# Patient Record
Sex: Female | Born: 1940
Health system: Southern US, Community
[De-identification: ages and names within clinical notes are randomized; demographics above are authoritative.]

## PROBLEM LIST (undated history)

## (undated) DIAGNOSIS — K5790 Diverticulosis of intestine, part unspecified, without perforation or abscess without bleeding: Secondary | ICD-10-CM

## (undated) DIAGNOSIS — I10 Essential (primary) hypertension: Secondary | ICD-10-CM

## (undated) DIAGNOSIS — E7800 Pure hypercholesterolemia, unspecified: Secondary | ICD-10-CM

## (undated) DIAGNOSIS — H409 Unspecified glaucoma: Secondary | ICD-10-CM

## (undated) DIAGNOSIS — K219 Gastro-esophageal reflux disease without esophagitis: Secondary | ICD-10-CM

## (undated) DIAGNOSIS — E041 Nontoxic single thyroid nodule: Secondary | ICD-10-CM

## (undated) DIAGNOSIS — D649 Anemia, unspecified: Secondary | ICD-10-CM

## (undated) HISTORY — PX: TONSILLECTOMY: SUR1361

## (undated) HISTORY — DX: Unspecified glaucoma: H40.9

## (undated) HISTORY — DX: Pure hypercholesterolemia, unspecified: E78.00

## (undated) HISTORY — DX: Nontoxic single thyroid nodule: E04.1

## (undated) HISTORY — DX: Essential (primary) hypertension: I10

## (undated) HISTORY — DX: Anemia, unspecified: D64.9

## (undated) HISTORY — DX: Gastro-esophageal reflux disease without esophagitis: K21.9

## (undated) HISTORY — DX: Diverticulosis of intestine, part unspecified, without perforation or abscess without bleeding: K57.90

---

## 1985-02-21 HISTORY — PX: ABDOMINAL HYSTERECTOMY: SHX81

## 2004-04-19 ENCOUNTER — Ambulatory Visit: Payer: Self-pay | Admitting: Internal Medicine

## 2005-08-10 ENCOUNTER — Ambulatory Visit: Payer: Self-pay | Admitting: Internal Medicine

## 2006-10-06 ENCOUNTER — Ambulatory Visit: Payer: Self-pay | Admitting: Unknown Physician Specialty

## 2006-10-10 ENCOUNTER — Ambulatory Visit: Payer: Self-pay | Admitting: Ophthalmology

## 2006-11-21 ENCOUNTER — Ambulatory Visit: Payer: Self-pay | Admitting: Ophthalmology

## 2006-11-29 ENCOUNTER — Ambulatory Visit: Payer: Self-pay | Admitting: Internal Medicine

## 2006-12-15 ENCOUNTER — Ambulatory Visit: Payer: Self-pay | Admitting: Unknown Physician Specialty

## 2008-03-06 ENCOUNTER — Ambulatory Visit: Payer: Self-pay | Admitting: Internal Medicine

## 2008-06-09 ENCOUNTER — Ambulatory Visit: Payer: Self-pay | Admitting: Internal Medicine

## 2009-08-13 ENCOUNTER — Ambulatory Visit: Payer: Self-pay | Admitting: Internal Medicine

## 2010-08-16 ENCOUNTER — Ambulatory Visit: Payer: Self-pay | Admitting: Internal Medicine

## 2011-01-27 ENCOUNTER — Ambulatory Visit: Payer: Self-pay | Admitting: Internal Medicine

## 2011-08-17 ENCOUNTER — Ambulatory Visit: Payer: Self-pay | Admitting: Internal Medicine

## 2011-09-30 ENCOUNTER — Ambulatory Visit: Payer: Self-pay | Admitting: Internal Medicine

## 2011-10-07 ENCOUNTER — Emergency Department: Payer: Self-pay | Admitting: Internal Medicine

## 2011-11-29 ENCOUNTER — Other Ambulatory Visit: Payer: Self-pay | Admitting: Internal Medicine

## 2011-11-29 NOTE — Telephone Encounter (Signed)
Patient is needing refill on her Losartan 100 mg and Omeprazole 20 mg.

## 2011-11-30 MED ORDER — OMEPRAZOLE 20 MG PO CPDR: 20.0000 mg | DELAYED_RELEASE_CAPSULE | Freq: Every day | ORAL | Status: DC

## 2011-11-30 MED ORDER — LOSARTAN POTASSIUM 100 MG PO TABS: 100.0000 mg | ORAL_TABLET | Freq: Every day | ORAL | Status: DC

## 2012-02-21 ENCOUNTER — Encounter: Payer: Self-pay | Admitting: Internal Medicine

## 2012-02-21 ENCOUNTER — Ambulatory Visit (INDEPENDENT_AMBULATORY_CARE_PROVIDER_SITE_OTHER): Payer: Medicare Other | Admitting: Internal Medicine

## 2012-02-21 VITALS — BP 150/80 | HR 61 | Temp 97.8°F | Ht 62.5 in | Wt 118.5 lb

## 2012-02-21 DIAGNOSIS — E7800 Pure hypercholesterolemia, unspecified: Secondary | ICD-10-CM

## 2012-02-21 DIAGNOSIS — E042 Nontoxic multinodular goiter: Secondary | ICD-10-CM

## 2012-02-21 DIAGNOSIS — I10 Essential (primary) hypertension: Secondary | ICD-10-CM

## 2012-02-21 DIAGNOSIS — Z139 Encounter for screening, unspecified: Secondary | ICD-10-CM

## 2012-02-21 DIAGNOSIS — E1165 Type 2 diabetes mellitus with hyperglycemia: Secondary | ICD-10-CM | POA: Insufficient documentation

## 2012-02-21 MED ORDER — LOSARTAN POTASSIUM 100 MG PO TABS: 100.0000 mg | ORAL_TABLET | Freq: Every day | ORAL | Status: DC

## 2012-02-21 MED ORDER — METOPROLOL SUCCINATE ER 25 MG PO TB24: 25.0000 mg | ORAL_TABLET | Freq: Every day | ORAL | Status: DC

## 2012-02-21 MED ORDER — OMEPRAZOLE 20 MG PO CPDR: 20.0000 mg | DELAYED_RELEASE_CAPSULE | Freq: Every day | ORAL | Status: DC

## 2012-02-22 ENCOUNTER — Encounter: Payer: Self-pay | Admitting: Internal Medicine

## 2012-02-22 NOTE — Assessment & Plan Note (Signed)
Saw Dr Renae Fickle.  Obtain records.

## 2012-02-22 NOTE — Progress Notes (Signed)
  Subjective:    Patient ID: Cathy Vazquez, female    DOB: 02/21/1941, 72 y.o.   MRN: [id number]  HPI 72 year old female with past history of hypertension and hypercholesterolemia who comes in today to follow up on these issues as well as for a complete physical exam.  She states she is doing well.  No cardiac symptoms with increased activity or exertion.  She stopped her omeprazole temporarily (to see if she could come off) and her reflux symptoms worsened.  She is back on the medication now and symptoms controlled.  Has never had an EGD.  Blood pressure has been doing well - averaging 136-140 systolic readings.  No nausea or vomiting.  Bowels stable.    Past Medical History  Diagnosis Date  . Hypertension   . Hypercholesterolemia   . GERD (gastroesophageal reflux disease)   . Glaucoma   . Anemia   . Thyroid nodule   . Diverticulosis     Current Outpatient Prescriptions on File Prior to Visit  Medication Sig Dispense Refill  . losartan (COZAAR) 100 MG tablet Take 1 tablet (100 mg total) by mouth daily.  90 tablet  3  . metoprolol succinate (TOPROL-XL) 25 MG 24 hr tablet Take 1 tablet (25 mg total) by mouth daily.  90 tablet  3  . omeprazole (PRILOSEC) 20 MG capsule Take 1 capsule (20 mg total) by mouth daily.  90 capsule  3    Review of Systems Patient denies any headache, lightheadedness or dizziness.  No sinus or allergy symptoms.  No chest pain, tightness or palpitations.  No increased shortness of breath, cough or congestion.  No nausea or vomiting.  No acid reflux currently - on prilosec.  See above.  No abdominal pain or cramping.  No bowel change, such as diarrhea, constipation, BRBPR or melana.  No urine change.        Objective:   Physical Exam Filed Vitals:   02/21/12 1419  BP: 150/80  Pulse: 61  Temp: 97.8 F (36.6 C)   Blood pressure recheck:  83/72  72 year old female in no acute distress.   HEENT:  Nares- clear.  Oropharynx - without lesions. NECK:  Supple.   Nontender.  No audible bruit.  HEART:  Appears to be regular. LUNGS:  No crackles or wheezing audible.  Respirations even and unlabored.  RADIAL PULSE:  Equal bilaterally.    BREASTS:  No nipple discharge or nipple retraction present.  Could not appreciate any distinct nodules or axillary adenopathy.  ABDOMEN:  Soft, nontender.  Bowel sounds present and normal.  No audible abdominal bruit.  GU:  Normal external genitalia.  Vaginal vault without lesion.  S/p hysterectomy.  Pap not performed. Could not appreciate any adnexal masses or tenderness.   RECTAL:  Heme negative.   EXTREMITIES:  No increased edema present.  DP pulses palpable and equal bilaterally.           Assessment & Plan:  GI.  Colonoscopy 10/06/06 with internal hemorrhoids and diverticulosis.  Barium enema checked out fine.  IFOB.   CARDIOVASCULAR.  Asymptomatic.    HEALTH MAINTENANCE.  Physical today.  Is s/p hysterectomy.  Does not require yearly pap smears.  Colonoscopy as outlined.  IFOB.  States she had her mammogram this summer.  Obtain results.

## 2012-02-22 NOTE — Assessment & Plan Note (Signed)
Low cholesterol diet.  Check lipid panel.    

## 2012-02-22 NOTE — Assessment & Plan Note (Signed)
Blood pressure as outlined.  Have her spot check her pressures and record.  Same medication.  Check metabolic panel.

## 2012-02-29 ENCOUNTER — Other Ambulatory Visit: Payer: Medicare Other

## 2012-03-08 ENCOUNTER — Other Ambulatory Visit (INDEPENDENT_AMBULATORY_CARE_PROVIDER_SITE_OTHER): Payer: Medicare Other

## 2012-03-08 DIAGNOSIS — I10 Essential (primary) hypertension: Secondary | ICD-10-CM

## 2012-03-08 DIAGNOSIS — E7800 Pure hypercholesterolemia, unspecified: Secondary | ICD-10-CM

## 2012-03-08 DIAGNOSIS — E042 Nontoxic multinodular goiter: Secondary | ICD-10-CM

## 2012-03-08 DIAGNOSIS — Z139 Encounter for screening, unspecified: Secondary | ICD-10-CM

## 2012-03-08 LAB — BASIC METABOLIC PANEL
Calcium: 9 mg/dL (ref 8.4–10.5)
Chloride: 106 mEq/L (ref 96–112)
Creatinine, Ser: 0.9 mg/dL (ref 0.4–1.2)

## 2012-03-08 LAB — HEPATIC FUNCTION PANEL
ALT: 27 U/L (ref 0–35)
Bilirubin, Direct: 0.1 mg/dL (ref 0.0–0.3)
Total Bilirubin: 0.7 mg/dL (ref 0.3–1.2)

## 2012-03-08 LAB — CBC WITH DIFFERENTIAL/PLATELET
Basophils Relative: 0.4 % (ref 0.0–3.0)
Eosinophils Relative: 1.7 % (ref 0.0–5.0)
HCT: 39.7 % (ref 36.0–46.0)
MCV: 96.4 fl (ref 78.0–100.0)
Monocytes Absolute: 0.5 10*3/uL (ref 0.1–1.0)
Neutrophils Relative %: 61.6 % (ref 43.0–77.0)
RBC: 4.12 Mil/uL (ref 3.87–5.11)
WBC: 8.4 10*3/uL (ref 4.5–10.5)

## 2012-03-08 LAB — LIPID PANEL
Cholesterol: 229 mg/dL — ABNORMAL HIGH (ref 0–200)
Total CHOL/HDL Ratio: 4
VLDL: 17.6 mg/dL (ref 0.0–40.0)

## 2012-03-12 LAB — FECAL OCCULT BLOOD, IMMUNOCHEMICAL: Fecal Occult Bld: NEGATIVE

## 2012-03-22 ENCOUNTER — Ambulatory Visit: Payer: Medicare Other | Admitting: Internal Medicine

## 2012-03-27 ENCOUNTER — Ambulatory Visit: Payer: Medicare Other | Admitting: Internal Medicine

## 2012-04-03 ENCOUNTER — Encounter: Payer: Self-pay | Admitting: *Deleted

## 2012-04-06 ENCOUNTER — Ambulatory Visit: Payer: Medicare Other | Admitting: Internal Medicine

## 2012-04-10 ENCOUNTER — Telehealth: Payer: Self-pay | Admitting: Internal Medicine

## 2012-04-10 NOTE — Telephone Encounter (Signed)
Left message for patient to call to reschedule her appointment from 2.14.14  cancellation due to weather.

## 2012-04-13 ENCOUNTER — Ambulatory Visit: Payer: Self-pay | Admitting: Internal Medicine

## 2012-04-26 ENCOUNTER — Encounter: Payer: Self-pay | Admitting: Internal Medicine

## 2012-04-26 ENCOUNTER — Ambulatory Visit (INDEPENDENT_AMBULATORY_CARE_PROVIDER_SITE_OTHER): Payer: Medicare Other | Admitting: Internal Medicine

## 2012-04-26 VITALS — BP 138/80 | HR 65 | Temp 97.9°F | Ht 62.5 in | Wt 163.2 lb

## 2012-04-26 DIAGNOSIS — I10 Essential (primary) hypertension: Secondary | ICD-10-CM

## 2012-04-26 DIAGNOSIS — E7800 Pure hypercholesterolemia, unspecified: Secondary | ICD-10-CM

## 2012-04-26 DIAGNOSIS — E042 Nontoxic multinodular goiter: Secondary | ICD-10-CM

## 2012-04-26 NOTE — Progress Notes (Signed)
  Subjective:    Patient ID: Cathy Vazquez, female    DOB: 10/28/1940, 72 y.o.   MRN: [id number]  HPI 72 year old female with past history of hypertension and hypercholesterolemia who comes in today for a scheduled follow up.  She states she is doing well.  No cardiac symptoms with increased activity or exertion.  Breathing stable.  Blood pressure has been doing well - averaging 120-140s/70s.  No nausea or vomiting.  Bowels stable.  Overall she feels good.    Past Medical History  Diagnosis Date  . Hypertension   . Hypercholesterolemia   . GERD (gastroesophageal reflux disease)   . Glaucoma   . Anemia   . Thyroid nodule   . Diverticulosis     Current Outpatient Prescriptions on File Prior to Visit  Medication Sig Dispense Refill  . aspirin 81 MG tablet Take 81 mg by mouth daily.      . dorzolamide-timolol (COSOPT) 22.3-6.8 MG/ML ophthalmic solution 1 drop 2 (two) times daily.      . fish oil-omega-3 fatty acids 1000 MG capsule Take 1 g by mouth 3 (three) times daily.      Marland Kitchen losartan (COZAAR) 100 MG tablet Take 1 tablet (100 mg total) by mouth daily.  90 tablet  3  . metoprolol succinate (TOPROL-XL) 25 MG 24 hr tablet Take 1 tablet (25 mg total) by mouth daily.  90 tablet  3  . Multiple Vitamin (MULTIVITAMIN) tablet Take 1 tablet by mouth daily.      . niacin 500 MG tablet Three capsules bid      . omeprazole (PRILOSEC) 20 MG capsule Take 1 capsule (20 mg total) by mouth daily.  90 capsule  3   No current facility-administered medications on file prior to visit.    Review of Systems  Patient denies any headache, lightheadedness or dizziness.  No sinus or allergy symptoms.  No chest pain, tightness or palpitations.  No increased shortness of breath, cough or congestion.  No nausea or vomiting.  No acid reflux currently - on prilosec.  See above.  No abdominal pain or cramping.  No bowel change, such as diarrhea, constipation, BRBPR or melana.  No urine change.  Has joined curves.   Feels better.  Just saw Dr Renae Fickle.  States everything checked out fine.  Due to see her again in 18 months.        Objective:   Physical Exam  Filed Vitals:   04/26/12 0940  BP: 138/80  Pulse: 65  Temp: 97.9 F (36.6 C)   Blood pressure recheck:  33/59  72 year old female in no acute distress.   HEENT:  Nares- clear.  Oropharynx - without lesions. NECK:  Supple.  Nontender.  No audible bruit.  HEART:  Appears to be regular. LUNGS:  No crackles or wheezing audible.  Respirations even and unlabored.  RADIAL PULSE:  Equal bilaterally.   ABDOMEN:  Soft, nontender.  Bowel sounds present and normal.  No audible abdominal bruit.  EXTREMITIES:  No increased edema present.  DP pulses palpable and equal bilaterally.           Assessment & Plan:  GI.  Colonoscopy 10/06/06 with internal hemorrhoids and diverticulosis.  Barium enema checked out fine.   CARDIOVASCULAR.  Asymptomatic.    HEALTH MAINTENANCE.  Physical last visit.  Is s/p hysterectomy.  Does not require yearly pap smears.  Colonoscopy as outlined.  States she had her mammogram this summer.

## 2012-04-28 ENCOUNTER — Encounter: Payer: Self-pay | Admitting: Internal Medicine

## 2012-04-28 NOTE — Assessment & Plan Note (Signed)
Saw Dr Renae Fickle.  Obtain records.  States everything checked out fine.  Due to follow up in 18 months.

## 2012-04-28 NOTE — Assessment & Plan Note (Signed)
Blood pressure as outlined.  Same medication.  Follow metabolic panel.   

## 2012-04-28 NOTE — Assessment & Plan Note (Signed)
Low cholesterol diet.  follow lipid panel.

## 2012-07-27 ENCOUNTER — Encounter: Payer: Self-pay | Admitting: Internal Medicine

## 2012-07-27 ENCOUNTER — Ambulatory Visit (INDEPENDENT_AMBULATORY_CARE_PROVIDER_SITE_OTHER): Payer: Medicare Other | Admitting: Internal Medicine

## 2012-07-27 VITALS — BP 120/80 | HR 70 | Temp 98.6°F | Ht 62.5 in | Wt 162.8 lb

## 2012-07-27 DIAGNOSIS — E7800 Pure hypercholesterolemia, unspecified: Secondary | ICD-10-CM

## 2012-07-27 DIAGNOSIS — I10 Essential (primary) hypertension: Secondary | ICD-10-CM

## 2012-07-27 DIAGNOSIS — E042 Nontoxic multinodular goiter: Secondary | ICD-10-CM

## 2012-07-29 ENCOUNTER — Encounter: Payer: Self-pay | Admitting: Internal Medicine

## 2012-07-29 NOTE — Assessment & Plan Note (Signed)
Saw Dr Paul.  States everything checked out fine.  Due to follow up in 18 months from last visit.    

## 2012-07-29 NOTE — Progress Notes (Signed)
  Subjective:    Patient ID: Cathy Vazquez, female    DOB: 25-Sep-1940, 72 y.o.   MRN: [id number]  HPI 72 year old female with past history of hypertension and hypercholesterolemia who comes in today for a scheduled follow up.  She states she is doing well.  No cardiac symptoms with increased activity or exertion.  Breathing stable.  Blood pressure has been doing well - per her report.  No nausea or vomiting.  Bowels stable.  Overall she feels good.  Stays active.     Past Medical History  Diagnosis Date  . Hypertension   . Hypercholesterolemia   . GERD (gastroesophageal reflux disease)   . Glaucoma   . Anemia   . Thyroid nodule   . Diverticulosis     Current Outpatient Prescriptions on File Prior to Visit  Medication Sig Dispense Refill  . aspirin 81 MG tablet Take 81 mg by mouth daily.      . dorzolamide-timolol (COSOPT) 22.3-6.8 MG/ML ophthalmic solution 1 drop 2 (two) times daily.      . fish oil-omega-3 fatty acids 1000 MG capsule Take 1 g by mouth 3 (three) times daily.      Marland Kitchen losartan (COZAAR) 100 MG tablet Take 1 tablet (100 mg total) by mouth daily.  90 tablet  3  . metoprolol succinate (TOPROL-XL) 25 MG 24 hr tablet Take 1 tablet (25 mg total) by mouth daily.  90 tablet  3  . Multiple Vitamin (MULTIVITAMIN) tablet Take 1 tablet by mouth daily.      . niacin 500 MG tablet Three capsules bid      . omeprazole (PRILOSEC) 20 MG capsule Take 1 capsule (20 mg total) by mouth daily.  90 capsule  3   No current facility-administered medications on file prior to visit.    Review of Systems Patient denies any headache, lightheadedness or dizziness.  No sinus or allergy symptoms.  No chest pain, tightness or palpitations.  No increased shortness of breath, cough or congestion.  No nausea or vomiting.  No acid reflux currently - on prilosec.  No abdominal pain or cramping.  No bowel change, such as diarrhea, constipation, BRBPR or melana.  No urine change.          Objective:   Physical Exam  Filed Vitals:   07/27/12 0908  BP: 120/80  Pulse: 70  Temp: 98.6 F (37 C)   Blood pressure recheck:  59-88/41  72 year old female in no acute distress.   HEENT:  Nares- clear.  Oropharynx - without lesions. NECK:  Supple.  Nontender.  No audible bruit.  HEART:  Appears to be regular. LUNGS:  No crackles or wheezing audible.  Respirations even and unlabored.  RADIAL PULSE:  Equal bilaterally.   ABDOMEN:  Soft, nontender.  Bowel sounds present and normal.  No audible abdominal bruit.  EXTREMITIES:  No increased edema present.  DP pulses palpable and equal bilaterally.           Assessment & Plan:  GI.  Colonoscopy 10/06/06 with internal hemorrhoids and diverticulosis.  Barium enema checked out fine.   CARDIOVASCULAR.  Asymptomatic.    HEALTH MAINTENANCE.  Physical 02/21/12.  Is s/p hysterectomy.  Does not require yearly pap smears.  Colonoscopy as outlined.  Last mammogram 08/17/11.  Schedule a f/u mammogram.  Normal bone density 10/05/11.

## 2012-07-29 NOTE — Assessment & Plan Note (Signed)
Blood pressure as outlined.  Same medication.  Follow metabolic panel.   

## 2012-07-29 NOTE — Assessment & Plan Note (Signed)
Low cholesterol diet.  follow lipid panel.

## 2012-10-25 ENCOUNTER — Telehealth: Payer: Self-pay | Admitting: *Deleted

## 2012-10-25 ENCOUNTER — Other Ambulatory Visit (INDEPENDENT_AMBULATORY_CARE_PROVIDER_SITE_OTHER): Payer: Medicare Other

## 2012-10-25 DIAGNOSIS — E7800 Pure hypercholesterolemia, unspecified: Secondary | ICD-10-CM

## 2012-10-25 DIAGNOSIS — I10 Essential (primary) hypertension: Secondary | ICD-10-CM

## 2012-10-25 LAB — LIPID PANEL
HDL: 71.9 mg/dL (ref >39.00)
Total CHOL/HDL Ratio: 3
Triglycerides: 137 mg/dL (ref 0.0–149.0)

## 2012-10-25 LAB — COMPREHENSIVE METABOLIC PANEL
ALT: 25 U/L (ref 0–35)
Alkaline Phosphatase: 57 U/L (ref 39–117)
CO2: 27 mEq/L (ref 19–32)
Sodium: 138 mEq/L (ref 135–145)
Total Bilirubin: 0.6 mg/dL (ref 0.3–1.2)
Total Protein: 6.6 g/dL (ref 6.0–8.3)

## 2012-10-25 NOTE — Telephone Encounter (Signed)
The only labs I need are the met c and lipid.  Thanks.

## 2012-10-25 NOTE — Telephone Encounter (Signed)
Just an FYI   I collected the lipid and cmet   i seen that there was a LDL cholesterol, bmet, cbc and tsh; but these have expired, I collected ab extra a lavender top as well, let me know if you needed these as well so I can get these test done

## 2012-10-29 ENCOUNTER — Encounter: Payer: Self-pay | Admitting: Internal Medicine

## 2012-10-29 ENCOUNTER — Ambulatory Visit (INDEPENDENT_AMBULATORY_CARE_PROVIDER_SITE_OTHER): Payer: Medicare Other | Admitting: Internal Medicine

## 2012-10-29 VITALS — BP 120/80 | HR 73 | Temp 97.8°F | Ht 62.5 in | Wt 165.5 lb

## 2012-10-29 DIAGNOSIS — I10 Essential (primary) hypertension: Secondary | ICD-10-CM

## 2012-10-29 DIAGNOSIS — E042 Nontoxic multinodular goiter: Secondary | ICD-10-CM

## 2012-10-29 DIAGNOSIS — E7800 Pure hypercholesterolemia, unspecified: Secondary | ICD-10-CM

## 2012-10-29 NOTE — Progress Notes (Signed)
  Subjective:    Patient ID: Cathy Vazquez, female    DOB: October 06, 1940, 72 y.o.   MRN: [id number]  HPI 72 year old female with past history of hypertension and hypercholesterolemia who comes in today for a scheduled follow up.  She states she is doing well.  No cardiac symptoms with increased activity or exertion.  Breathing stable.  Blood pressure has been doing well - per her report.  States her blood pressure has been averaging 130-135/75.  No nausea or vomiting.  Bowels stable.  Overall she feels good.  Stays active.     Past Medical History  Diagnosis Date  . Hypertension   . Hypercholesterolemia   . GERD (gastroesophageal reflux disease)   . Glaucoma   . Anemia   . Thyroid nodule   . Diverticulosis     Current Outpatient Prescriptions on File Prior to Visit  Medication Sig Dispense Refill  . aspirin 81 MG tablet Take 81 mg by mouth daily.      . dorzolamide-timolol (COSOPT) 22.3-6.8 MG/ML ophthalmic solution 1 drop 2 (two) times daily.      . fish oil-omega-3 fatty acids 1000 MG capsule Take 1 g by mouth 3 (three) times daily.      Marland Kitchen losartan (COZAAR) 100 MG tablet Take 1 tablet (100 mg total) by mouth daily.  90 tablet  3  . metoprolol succinate (TOPROL-XL) 25 MG 24 hr tablet Take 1 tablet (25 mg total) by mouth daily.  90 tablet  3  . Multiple Vitamin (MULTIVITAMIN) tablet Take 1 tablet by mouth daily.      . niacin 500 MG tablet Three capsules bid      . omeprazole (PRILOSEC) 20 MG capsule Take 1 capsule (20 mg total) by mouth daily.  90 capsule  3   No current facility-administered medications on file prior to visit.    Review of Systems Patient denies any headache, lightheadedness or dizziness.  No sinus or allergy symptoms.  No chest pain, tightness or palpitations.  No increased shortness of breath, cough or congestion.  No nausea or vomiting.  No acid reflux currently - on prilosec.  No abdominal pain or cramping.  No bowel change, such as diarrhea, constipation, BRBPR or  melana.  No urine change.          Objective:   Physical Exam  Filed Vitals:   10/29/12 0855  BP: 120/80  Pulse: 73  Temp: 97.8 F (36.6 C)   Blood pressure recheck:  65/13  72 year old female in no acute distress.   HEENT:  Nares- clear.  Oropharynx - without lesions. NECK:  Supple.  Nontender.  No audible bruit.  HEART:  Appears to be regular. LUNGS:  No crackles or wheezing audible.  Respirations even and unlabored.  RADIAL PULSE:  Equal bilaterally.   ABDOMEN:  Soft, nontender.  Bowel sounds present and normal.  No audible abdominal bruit.  EXTREMITIES:  No increased edema present.  DP pulses palpable and equal bilaterally.           Assessment & Plan:  GI.  Colonoscopy 10/06/06 with internal hemorrhoids and diverticulosis.  Barium enema checked out fine.   CARDIOVASCULAR.  Asymptomatic.    HEALTH MAINTENANCE.  Physical 02/21/12.  Is s/p hysterectomy.  Does not require yearly pap smears.  Colonoscopy as outlined.  Last mammogram 08/17/11.  Needs to schedule a f/u mammogram.  Information given to her to schedule her mammogram.  Normal bone density 10/05/11.

## 2012-10-29 NOTE — Assessment & Plan Note (Signed)
Low cholesterol diet.  Follow lipid panel.  LDL on check recently elevated - 163.  Triglycerides 137 and HDL 70.  She has not been taking her niacin regularly.  Will restart.  Follow.

## 2012-10-30 ENCOUNTER — Encounter: Payer: Self-pay | Admitting: Internal Medicine

## 2012-10-30 NOTE — Assessment & Plan Note (Signed)
Saw Dr Paul.  States everything checked out fine.  Due to follow up in 18 months from last visit.    

## 2012-10-30 NOTE — Assessment & Plan Note (Signed)
Blood pressure as outlined.  Same medication.  Follow metabolic panel.   

## 2013-01-22 ENCOUNTER — Ambulatory Visit: Payer: Self-pay | Admitting: Internal Medicine

## 2013-01-22 LAB — HM MAMMOGRAPHY: HM Mammogram: NEGATIVE

## 2013-01-23 ENCOUNTER — Encounter: Payer: Self-pay | Admitting: Internal Medicine

## 2013-02-12 ENCOUNTER — Telehealth: Payer: Self-pay | Admitting: Internal Medicine

## 2013-02-12 MED ORDER — LOSARTAN POTASSIUM 100 MG PO TABS: 100.0000 mg | ORAL_TABLET | Freq: Every day | ORAL | Status: DC

## 2013-02-12 NOTE — Telephone Encounter (Signed)
No Rx refills received from [facility]. Rx sent to pharmacy by [facility], pt notified.

## 2013-02-12 NOTE — Telephone Encounter (Signed)
Cell number [phone] Pt called checking on her rx for lasartin 100mg  tab [facility] stating they have been trying to get in touch with this office.

## 2013-03-06 ENCOUNTER — Encounter: Payer: Medicare Other | Admitting: Internal Medicine

## 2013-03-26 ENCOUNTER — Ambulatory Visit (INDEPENDENT_AMBULATORY_CARE_PROVIDER_SITE_OTHER)
Admission: RE | Admit: 2013-03-26 | Discharge: 2013-03-26 | Disposition: A | Discharge status: Home / Self Care | Payer: Medicare HMO | Source: Ambulatory Visit | Attending: Internal Medicine | Admitting: Internal Medicine

## 2013-03-26 ENCOUNTER — Ambulatory Visit (INDEPENDENT_AMBULATORY_CARE_PROVIDER_SITE_OTHER): Payer: Medicare HMO | Admitting: Internal Medicine

## 2013-03-26 ENCOUNTER — Encounter: Payer: Self-pay | Admitting: Internal Medicine

## 2013-03-26 VITALS — BP 160/110 | HR 79 | Temp 97.7°F | Ht 62.5 in | Wt 175.5 lb

## 2013-03-26 DIAGNOSIS — M549 Dorsalgia, unspecified: Secondary | ICD-10-CM

## 2013-03-26 DIAGNOSIS — M25559 Pain in unspecified hip: Secondary | ICD-10-CM

## 2013-03-26 DIAGNOSIS — M25551 Pain in right hip: Secondary | ICD-10-CM

## 2013-03-26 DIAGNOSIS — M79609 Pain in unspecified limb: Secondary | ICD-10-CM

## 2013-03-26 DIAGNOSIS — I10 Essential (primary) hypertension: Secondary | ICD-10-CM

## 2013-03-26 DIAGNOSIS — M79604 Pain in right leg: Secondary | ICD-10-CM

## 2013-03-26 LAB — COMPREHENSIVE METABOLIC PANEL
ALT: 24 U/L (ref 0–35)
AST: 24 U/L (ref 0–37)
Albumin: 4.1 g/dL (ref 3.5–5.2)
Alkaline Phosphatase: 77 U/L (ref 39–117)
BUN: 14 mg/dL (ref 6–23)
CALCIUM: 9.5 mg/dL (ref 8.4–10.5)
CHLORIDE: 105 meq/L (ref 96–112)
CO2: 24 meq/L (ref 19–32)
CREATININE: 0.9 mg/dL (ref 0.4–1.2)
GFR: 65.79 mL/min (ref >60.00)
GLUCOSE: 92 mg/dL (ref 70–99)
Potassium: 4.1 mEq/L (ref 3.5–5.1)
Sodium: 137 mEq/L (ref 135–145)
Total Bilirubin: 0.6 mg/dL (ref 0.3–1.2)
Total Protein: 7.1 g/dL (ref 6.0–8.3)

## 2013-03-26 MED ORDER — HYDROCHLOROTHIAZIDE 25 MG PO TABS: 25.0000 mg | ORAL_TABLET | Freq: Every day | ORAL | Status: DC

## 2013-03-26 NOTE — Progress Notes (Signed)
Subjective:    Patient ID: Cathy Vazquez, female    DOB: [birth year], 73 y.o.   MRN: [id number]  Leg Pain   73 year old female with past history of hypertension and hypercholesterolemia who comes in today as a work in with concerns regarding increased left knee pain and right inner thigh pain.  She reports that her left knee feels stiff.  Also reports the pain in her right inner thigh started over the last week.  Able to stand better today.  Still with increased pain.  No injury or trauma.  Has been taking tylenol - 1/2 tablet tid.  Blood pressure is elevated.  No cardiac symptoms with increased activity or exertion.  Breathing stable.   No nausea or vomiting.  Bowels stable.  She quit working at [employer].   Past Medical History  Diagnosis Date  . Hypertension   . Hypercholesterolemia   . GERD (gastroesophageal reflux disease)   . Glaucoma   . Anemia   . Thyroid nodule   . Diverticulosis     Current Outpatient Prescriptions on File Prior to Visit  Medication Sig Dispense Refill  . aspirin 81 MG tablet Take 81 mg by mouth daily.      . dorzolamide-timolol (COSOPT) 22.3-6.8 MG/ML ophthalmic solution 1 drop 2 (two) times daily.      . fish oil-omega-3 fatty acids 1000 MG capsule Take 1 g by mouth 3 (three) times daily.      Marland Kitchen losartan (COZAAR) 100 MG tablet Take 1 tablet (100 mg total) by mouth daily.  90 tablet  1  . metoprolol succinate (TOPROL-XL) 25 MG 24 hr tablet Take 1 tablet (25 mg total) by mouth daily.  90 tablet  3  . Multiple Vitamin (MULTIVITAMIN) tablet Take 1 tablet by mouth daily.      . niacin 500 MG tablet Three capsules bid      . omeprazole (PRILOSEC) 20 MG capsule Take 1 capsule (20 mg total) by mouth daily.  90 capsule  3   No current facility-administered medications on file prior to visit.    Review of Systems Patient denies any headache, lightheadedness or dizziness.  No chest pain, tightness or palpitations.  No increased shortness of breath, cough or congestion.   No nausea or vomiting.  No acid reflux currently - on prilosec.  Blood pressure elevated.  Knee pain and right inner thigh pain as outlined.            Objective:   Physical Exam  Filed Vitals:   03/26/13 1331  BP: 160/110  Pulse: 79  Temp: 97.7 F (36.5 C)   Blood pressure recheck:  3/26-9  73 year old female in no acute distress.   HEENT:  Nares- clear.  Oropharynx - without lesions. NECK:  Supple.  Nontender.  No audible bruit.  HEART:  Appears to be regular. LUNGS:  No crackles or wheezing audible.  Respirations even and unlabored.  RADIAL PULSE:  Equal bilaterally.   ABDOMEN:  Soft, nontender.  Bowel sounds present and normal.  No audible abdominal bruit.  EXTREMITIES:  No increased edema present.  DP pulses palpable and equal bilaterally. MSK:  Increased pain right inner thigh - with adduction right lower leg.  Some increased pain right lower buttock/hip with straight leg raise.  Some stiffness with left knee.             Assessment & Plan:  GI.  Colonoscopy 10/06/06 with internal hemorrhoids and diverticulosis.  Barium enema checked out  fine.   CARDIOVASCULAR.  Asymptomatic.    HEALTH MAINTENANCE.  Physical last visit. .  Is s/p hysterectomy.  Does not require yearly pap smears.  Colonoscopy as outlined.  Last mammogram 01/22/13- Birads I.  Normal bone density 10/05/11.

## 2013-03-27 ENCOUNTER — Ambulatory Visit: Payer: Medicare Other | Admitting: Internal Medicine

## 2013-03-27 ENCOUNTER — Telehealth: Payer: Self-pay | Admitting: *Deleted

## 2013-03-27 NOTE — Telephone Encounter (Signed)
Pt notified of lab results from 03/26/13

## 2013-03-29 ENCOUNTER — Telehealth: Payer: Self-pay | Admitting: Internal Medicine

## 2013-03-29 NOTE — Telephone Encounter (Signed)
Phoned in refill.  

## 2013-03-29 NOTE — Telephone Encounter (Signed)
Pt states pharmacy told her they did not receive rx for hydrochlorothiazide.  Advised pt we received receipt.  States she will contact pharmacy again.  Asking if we can resend.

## 2013-03-31 ENCOUNTER — Encounter: Payer: Self-pay | Admitting: Internal Medicine

## 2013-03-31 DIAGNOSIS — M79604 Pain in right leg: Secondary | ICD-10-CM | POA: Insufficient documentation

## 2013-03-31 NOTE — Assessment & Plan Note (Signed)
Blood pressure elevated.  Has had intolerance to multiple medications.  On Losartan and metoprolol.  Start HCTZ q day.  Follow pressures.  Get her back in soon to reassess.  Check metabolic panel.

## 2013-03-31 NOTE — Assessment & Plan Note (Signed)
Right inner thigh pain and right hip and lower buttock/back pain.  Exam as outlined.  Increase tylenol as directed.  Xray.  Further w/uup pending results.

## 2013-04-03 ENCOUNTER — Encounter: Payer: Self-pay | Admitting: Internal Medicine

## 2013-04-03 ENCOUNTER — Ambulatory Visit (INDEPENDENT_AMBULATORY_CARE_PROVIDER_SITE_OTHER): Payer: Medicare HMO | Admitting: Internal Medicine

## 2013-04-03 VITALS — BP 150/90 | HR 76 | Temp 98.0°F | Ht 62.5 in | Wt 173.2 lb

## 2013-04-03 DIAGNOSIS — I10 Essential (primary) hypertension: Secondary | ICD-10-CM

## 2013-04-03 DIAGNOSIS — M79604 Pain in right leg: Secondary | ICD-10-CM

## 2013-04-03 DIAGNOSIS — E7800 Pure hypercholesterolemia, unspecified: Secondary | ICD-10-CM

## 2013-04-03 DIAGNOSIS — R5381 Other malaise: Secondary | ICD-10-CM

## 2013-04-03 DIAGNOSIS — R5383 Other fatigue: Secondary | ICD-10-CM

## 2013-04-03 DIAGNOSIS — M79609 Pain in unspecified limb: Secondary | ICD-10-CM

## 2013-04-03 NOTE — Progress Notes (Signed)
Pre-visit discussion using our clinic review tool. No additional management support is needed unless otherwise documented below in the visit note.  

## 2013-04-07 ENCOUNTER — Encounter: Payer: Self-pay | Admitting: Internal Medicine

## 2013-04-07 NOTE — Assessment & Plan Note (Signed)
Low cholesterol diet.  Follow lipid panel.  LDL on check recently elevated - 163.  Triglycerides 137 and HDL 70.  She had not been taking her niacin regularly.  Restarted.   Follow.

## 2013-04-07 NOTE — Assessment & Plan Note (Signed)
Has had intolerance to multiple medications.  On Losartan and HCTZ q day.  Off metoprolol.  Blood pressure better.  Follow metabolic panel.

## 2013-04-07 NOTE — Assessment & Plan Note (Signed)
Improved but persistent.  Arthritis and degenerative joint changes on xray.  Better.  Given persistence, will refer her to physical therapy for further evaluation and treatment.

## 2013-04-07 NOTE — Progress Notes (Signed)
Subjective:    Patient ID: Cathy Vazquez, female    DOB: [birth year], 73 y.o.   MRN: [id number]  HPI 73 year old female with past history of hypertension and hypercholesterolemia who comes in today for a scheduled follow up.  She states she is doing well.  No cardiac symptoms with increased activity or exertion.  Breathing stable.  Blood pressure has been doing better.  Tolerating her blood pressure medication.  Only on losartan and hctz now.  No nausea or vomiting.  Bowels stable.  Overall she feels good.  Stays active.  Her previous leg, hip and lower back pain has improved.  Much better now.     Past Medical History  Diagnosis Date  . Hypertension   . Hypercholesterolemia   . GERD (gastroesophageal reflux disease)   . Glaucoma   . Anemia   . Thyroid nodule   . Diverticulosis     Current Outpatient Prescriptions on File Prior to Visit  Medication Sig Dispense Refill  . aspirin 81 MG tablet Take 81 mg by mouth daily.      . dorzolamide-timolol (COSOPT) 22.3-6.8 MG/ML ophthalmic solution 1 drop 2 (two) times daily.      . fish oil-omega-3 fatty acids 1000 MG capsule Take 1 g by mouth 3 (three) times daily.      . hydrochlorothiazide (HYDRODIURIL) 25 MG tablet Take 1 tablet (25 mg total) by mouth daily.  30 tablet  2  . losartan (COZAAR) 100 MG tablet Take 1 tablet (100 mg total) by mouth daily.  90 tablet  1  . metoprolol succinate (TOPROL-XL) 25 MG 24 hr tablet Take 1 tablet (25 mg total) by mouth daily.  90 tablet  3  . Multiple Vitamin (MULTIVITAMIN) tablet Take 1 tablet by mouth daily.      . niacin 500 MG tablet Three capsules bid      . omeprazole (PRILOSEC) 20 MG capsule Take 1 capsule (20 mg total) by mouth daily.  90 capsule  3   No current facility-administered medications on file prior to visit.    Review of Systems Patient denies any headache, lightheadedness or dizziness.  No sinus or allergy symptoms.  No chest pain, tightness or palpitations.  No increased shortness  of breath, cough or congestion.  No nausea or vomiting.  No acid reflux currently - on prilosec.  No abdominal pain or cramping.  No bowel change, such as diarrhea, constipation, BRBPR or melana.  No urine change.  Right leg, hip and lower back pain better.  Still some discomfort, but overall better.         Objective:   Physical Exam  Filed Vitals:   04/03/13 1010  BP: 150/90  Pulse: 76  Temp: 98 F (36.7 C)   Blood pressure recheck:  10/25  73 year old female in no acute distress.   HEENT:  Nares- clear.  Oropharynx - without lesions. NECK:  Supple.  Nontender.  No audible bruit.  HEART:  Appears to be regular. LUNGS:  No crackles or wheezing audible.  Respirations even and unlabored.  RADIAL PULSE:  Equal bilaterally.   ABDOMEN:  Soft, nontender.  Bowel sounds present and normal.  No audible abdominal bruit.  EXTREMITIES:  No increased edema present.  DP pulses palpable and equal bilaterally.           Assessment & Plan:  GI.  Colonoscopy 10/06/06 with internal hemorrhoids and diverticulosis.  Barium enema checked out fine.   CARDIOVASCULAR.  Asymptomatic.  HEALTH MAINTENANCE.  Physical 02/21/12.  Is s/p hysterectomy.  Does not require yearly pap smears.  Colonoscopy as outlined.  Last mammogram 01/22/13 - Birads I.   Normal bone density 10/05/11.

## 2013-04-11 ENCOUNTER — Other Ambulatory Visit: Payer: Self-pay | Admitting: *Deleted

## 2013-04-11 ENCOUNTER — Telehealth: Payer: Self-pay | Admitting: Internal Medicine

## 2013-04-11 MED ORDER — LOSARTAN POTASSIUM 100 MG PO TABS: 100.0000 mg | ORAL_TABLET | Freq: Every day | ORAL | Status: DC

## 2013-04-11 NOTE — Telephone Encounter (Signed)
Please send to [place] . The patient is almost out of her medication.   losartan (COZAAR) 100 MG tablet

## 2013-04-17 ENCOUNTER — Other Ambulatory Visit: Payer: Self-pay | Admitting: Internal Medicine

## 2013-04-25 ENCOUNTER — Encounter: Payer: Self-pay | Admitting: Emergency Medicine

## 2013-05-03 ENCOUNTER — Other Ambulatory Visit (INDEPENDENT_AMBULATORY_CARE_PROVIDER_SITE_OTHER): Payer: Medicare HMO

## 2013-05-03 DIAGNOSIS — I10 Essential (primary) hypertension: Secondary | ICD-10-CM

## 2013-05-03 DIAGNOSIS — E7800 Pure hypercholesterolemia, unspecified: Secondary | ICD-10-CM

## 2013-05-03 DIAGNOSIS — R5381 Other malaise: Secondary | ICD-10-CM

## 2013-05-03 DIAGNOSIS — R5383 Other fatigue: Secondary | ICD-10-CM

## 2013-05-03 LAB — LIPID PANEL
CHOL/HDL RATIO: 5
Cholesterol: 242 mg/dL — ABNORMAL HIGH (ref 0–200)
HDL: 45.1 mg/dL (ref >39.00)
LDL Cholesterol: 171 mg/dL — ABNORMAL HIGH (ref 0–99)
TRIGLYCERIDES: 131 mg/dL (ref 0.0–149.0)
VLDL: 26.2 mg/dL (ref 0.0–40.0)

## 2013-05-03 LAB — CBC WITH DIFFERENTIAL/PLATELET
BASOS PCT: 0.4 % (ref 0.0–3.0)
Basophils Absolute: 0 10*3/uL (ref 0.0–0.1)
Eosinophils Absolute: 0.2 10*3/uL (ref 0.0–0.7)
Eosinophils Relative: 1.9 % (ref 0.0–5.0)
HEMATOCRIT: 38.1 % (ref 36.0–46.0)
Hemoglobin: 13.2 g/dL (ref 12.0–15.0)
LYMPHS ABS: 2.7 10*3/uL (ref 0.7–4.0)
LYMPHS PCT: 30.1 % (ref 12.0–46.0)
MCHC: 34.7 g/dL (ref 30.0–36.0)
MCV: 93.8 fl (ref 78.0–100.0)
Monocytes Absolute: 0.5 10*3/uL (ref 0.1–1.0)
Monocytes Relative: 5.3 % (ref 3.0–12.0)
NEUTROS ABS: 5.6 10*3/uL (ref 1.4–7.7)
Neutrophils Relative %: 62.3 % (ref 43.0–77.0)
Platelets: 218 10*3/uL (ref 150.0–400.0)
RBC: 4.06 Mil/uL (ref 3.87–5.11)
RDW: 13.7 % (ref 11.5–14.6)
WBC: 9.1 10*3/uL (ref 4.5–10.5)

## 2013-05-03 LAB — COMPREHENSIVE METABOLIC PANEL
ALT: 25 U/L (ref 0–35)
AST: 21 U/L (ref 0–37)
Albumin: 4.1 g/dL (ref 3.5–5.2)
Alkaline Phosphatase: 69 U/L (ref 39–117)
BUN: 14 mg/dL (ref 6–23)
CALCIUM: 8.8 mg/dL (ref 8.4–10.5)
CHLORIDE: 103 meq/L (ref 96–112)
CO2: 26 mEq/L (ref 19–32)
Creatinine, Ser: 0.9 mg/dL (ref 0.4–1.2)
GFR: 69.35 mL/min (ref >60.00)
GLUCOSE: 102 mg/dL — AB (ref 70–99)
Potassium: 4 mEq/L (ref 3.5–5.1)
Sodium: 137 mEq/L (ref 135–145)
Total Bilirubin: 0.7 mg/dL (ref 0.3–1.2)
Total Protein: 7 g/dL (ref 6.0–8.3)

## 2013-05-03 LAB — TSH: TSH: 1.42 u[IU]/mL (ref 0.35–5.50)

## 2013-05-09 ENCOUNTER — Encounter: Payer: Medicare Other | Admitting: Internal Medicine

## 2013-05-09 ENCOUNTER — Telehealth: Payer: Self-pay | Admitting: Internal Medicine

## 2013-05-09 NOTE — Telephone Encounter (Signed)
Pt.notified

## 2013-05-09 NOTE — Telephone Encounter (Signed)
Pt was originally scheduled for her cpt on 05/09/13.  We were holding labs for her appt.  She rescheduled her cpt to 07/30/13.  Please notify her that her potassium, kidney function and liver function are wnl.  Her total and LDL cholesterol elevated.  She is intolerant to statins.  (has tried multiple medications).  Have her work on a low cholesterol diet and exercise.  We will follow.

## 2013-07-30 ENCOUNTER — Encounter: Payer: Self-pay | Admitting: Internal Medicine

## 2013-07-30 ENCOUNTER — Ambulatory Visit (INDEPENDENT_AMBULATORY_CARE_PROVIDER_SITE_OTHER): Payer: Medicare HMO | Admitting: Internal Medicine

## 2013-07-30 VITALS — BP 170/100 | HR 88 | Temp 98.4°F | Ht 62.75 in | Wt 175.0 lb

## 2013-07-30 DIAGNOSIS — E7800 Pure hypercholesterolemia, unspecified: Secondary | ICD-10-CM

## 2013-07-30 DIAGNOSIS — E042 Nontoxic multinodular goiter: Secondary | ICD-10-CM

## 2013-07-30 DIAGNOSIS — M25569 Pain in unspecified knee: Secondary | ICD-10-CM

## 2013-07-30 DIAGNOSIS — I10 Essential (primary) hypertension: Secondary | ICD-10-CM

## 2013-07-30 MED ORDER — HYDROCHLOROTHIAZIDE 25 MG PO TABS: 25.0000 mg | ORAL_TABLET | Freq: Every day | ORAL | Status: DC

## 2013-07-30 MED ORDER — LOSARTAN POTASSIUM 100 MG PO TABS: ORAL_TABLET | ORAL | Status: DC

## 2013-07-30 NOTE — Progress Notes (Signed)
Pre visit review using our clinic review tool, if applicable. No additional management support is needed unless otherwise documented below in the visit note. 

## 2013-08-04 ENCOUNTER — Encounter: Payer: Self-pay | Admitting: Internal Medicine

## 2013-08-04 DIAGNOSIS — M25569 Pain in unspecified knee: Secondary | ICD-10-CM | POA: Insufficient documentation

## 2013-08-04 NOTE — Assessment & Plan Note (Signed)
Has had intolerance to multiple medications.  On Losartan only.  Off metoprolol and hctz.   Add back hctz.  Follow pressures.  Get her back in soon to reassess.  Follow metabolic panel.

## 2013-08-04 NOTE — Assessment & Plan Note (Signed)
Saw Dr Eddie Dibbles.  States everything checked out fine.  Due to follow up in 18 months from last visit.

## 2013-08-04 NOTE — Assessment & Plan Note (Signed)
Low cholesterol diet.  Follow lipid panel.  She had not been taking her niacin.  Restart.   Follow.

## 2013-08-04 NOTE — Progress Notes (Signed)
Subjective:    Patient ID: Cathy Vazquez, female    DOB: 1940-07-26, 73 y.o.   MRN: [id number]  HPI 73 year old female with past history of hypertension and hypercholesterolemia who comes in today to follow up on these issues as well as for a complete physical exam.   She states she is doing well.  No cardiac symptoms with increased activity or exertion.  Breathing stable.  Blood pressure on her checks have been averaging 140-145/70s.  She has stopped HCTZ and metoprolol.  States she did not tolerate these medications.   No nausea or vomiting.  Bowels stable.  Overall she feels good.  Stays active.  She is having some issues with her left knee.  She is going to exercise.  Desires no further intervention at this time.      Past Medical History  Diagnosis Date  . Hypertension   . Hypercholesterolemia   . GERD (gastroesophageal reflux disease)   . Glaucoma   . Anemia   . Thyroid nodule   . Diverticulosis     Current Outpatient Prescriptions on File Prior to Visit  Medication Sig Dispense Refill  . aspirin 81 MG tablet Take 81 mg by mouth daily.      . dorzolamide-timolol (COSOPT) 22.3-6.8 MG/ML ophthalmic solution 1 drop 2 (two) times daily.      . fish oil-omega-3 fatty acids 1000 MG capsule Take 1 g by mouth 3 (three) times daily.      . Multiple Vitamin (MULTIVITAMIN) tablet Take 1 tablet by mouth daily.      . niacin 500 MG tablet Three capsules bid      . omeprazole (PRILOSEC) 20 MG capsule Take 1 capsule (20 mg total) by mouth daily.  90 capsule  3   No current facility-administered medications on file prior to visit.    Review of Systems Patient denies any headache, lightheadedness or dizziness.  No sinus or allergy symptoms.  No chest pain, tightness or palpitations.  No increased shortness of breath, cough or congestion.  No nausea or vomiting.  No acid reflux currently - on prilosec.  No abdominal pain or cramping.  No bowel change, such as diarrhea, constipation, BRBPR or  melana.  No urine change.  Knee pain/stiffness as outlined.  Desires no further intervention.  Going to exercise class.         Objective:   Physical Exam  Filed Vitals:   07/30/13 1531  BP: 170/100  Pulse: 88  Temp: 98.4 F (36.9 C)   Blood pressure recheck:  170/82, pulse 32  73 year old female in no acute distress.   HEENT:  Nares- clear.  Oropharynx - without lesions. NECK:  Supple.  Nontender.  No audible bruit.  HEART:  Appears to be regular. LUNGS:  No crackles or wheezing audible.  Respirations even and unlabored.  RADIAL PULSE:  Equal bilaterally.    BREASTS:  No nipple discharge.  Left nipple inversion present - unchanged.   Could not appreciate any distinct nodules or axillary adenopathy.  ABDOMEN:  Soft, nontender.  Bowel sounds present and normal.  No audible abdominal bruit.  GU:  Not performed.     EXTREMITIES:  No increased edema present.  DP pulses palpable and equal bilaterally.      FEET:  No lesions.        Assessment & Plan:  GI.  Colonoscopy 10/06/06 with internal hemorrhoids and diverticulosis.  Barium enema checked out fine.   CARDIOVASCULAR.  Asymptomatic.  HEALTH MAINTENANCE.  Physical today.  Is s/p hysterectomy.  Does not require yearly pap smears.  Colonoscopy as outlined.  Last mammogram 01/22/13 - Birads I.   Normal bone density 10/05/11.

## 2013-08-04 NOTE — Assessment & Plan Note (Signed)
Desires no further intervention at this time.  Follow.  Continue exercise.

## 2013-08-30 ENCOUNTER — Other Ambulatory Visit (INDEPENDENT_AMBULATORY_CARE_PROVIDER_SITE_OTHER): Payer: Medicare HMO

## 2013-08-30 ENCOUNTER — Encounter (INDEPENDENT_AMBULATORY_CARE_PROVIDER_SITE_OTHER): Payer: Self-pay

## 2013-08-30 ENCOUNTER — Ambulatory Visit: Payer: Medicare HMO | Admitting: Internal Medicine

## 2013-08-30 DIAGNOSIS — E7800 Pure hypercholesterolemia, unspecified: Secondary | ICD-10-CM

## 2013-08-30 DIAGNOSIS — I10 Essential (primary) hypertension: Secondary | ICD-10-CM

## 2013-08-30 LAB — BASIC METABOLIC PANEL
BUN: 13 mg/dL (ref 6–23)
CO2: 26 meq/L (ref 19–32)
Calcium: 9.1 mg/dL (ref 8.4–10.5)
Chloride: 108 mEq/L (ref 96–112)
Creatinine, Ser: 0.9 mg/dL (ref 0.4–1.2)
GFR: 68.36 mL/min (ref >60.00)
GLUCOSE: 117 mg/dL — AB (ref 70–99)
POTASSIUM: 4.2 meq/L (ref 3.5–5.1)
Sodium: 141 mEq/L (ref 135–145)

## 2013-08-30 LAB — LIPID PANEL
Cholesterol: 246 mg/dL — ABNORMAL HIGH (ref 0–200)
HDL: 48.2 mg/dL (ref >39.00)
LDL CALC: 174 mg/dL — AB (ref 0–99)
NONHDL: 197.8
Total CHOL/HDL Ratio: 5
Triglycerides: 117 mg/dL (ref 0.0–149.0)
VLDL: 23.4 mg/dL (ref 0.0–40.0)

## 2013-08-30 LAB — HEPATIC FUNCTION PANEL
ALT: 23 U/L (ref 0–35)
AST: 21 U/L (ref 0–37)
Albumin: 4.1 g/dL (ref 3.5–5.2)
Alkaline Phosphatase: 67 U/L (ref 39–117)
Bilirubin, Direct: 0 mg/dL (ref 0.0–0.3)
Total Bilirubin: 0.4 mg/dL (ref 0.2–1.2)
Total Protein: 7.4 g/dL (ref 6.0–8.3)

## 2013-09-03 ENCOUNTER — Ambulatory Visit (INDEPENDENT_AMBULATORY_CARE_PROVIDER_SITE_OTHER): Payer: Medicare HMO | Admitting: Internal Medicine

## 2013-09-03 ENCOUNTER — Encounter: Payer: Self-pay | Admitting: Internal Medicine

## 2013-09-03 VITALS — BP 150/80 | HR 76 | Temp 98.2°F | Ht 62.5 in | Wt 175.5 lb

## 2013-09-03 DIAGNOSIS — R739 Hyperglycemia, unspecified: Secondary | ICD-10-CM

## 2013-09-03 DIAGNOSIS — R7309 Other abnormal glucose: Secondary | ICD-10-CM

## 2013-09-03 DIAGNOSIS — I10 Essential (primary) hypertension: Secondary | ICD-10-CM

## 2013-09-03 DIAGNOSIS — E7800 Pure hypercholesterolemia, unspecified: Secondary | ICD-10-CM

## 2013-09-03 NOTE — Progress Notes (Signed)
Pre visit review using our clinic review tool, if applicable. No additional management support is needed unless otherwise documented below in the visit note. 

## 2013-09-07 ENCOUNTER — Encounter: Payer: Self-pay | Admitting: Internal Medicine

## 2013-09-07 NOTE — Assessment & Plan Note (Signed)
Has had intolerance to multiple medications.  On Losartan only.  Off metoprolol and hctz.   Follow pressures.   Follow metabolic panel.  Feels better only on losartan.

## 2013-09-07 NOTE — Assessment & Plan Note (Signed)
Low cholesterol diet.  Follow lipid panel.  Back on niacin.  Titrate up the dose.  Follow.

## 2013-09-07 NOTE — Progress Notes (Signed)
Subjective:    Patient ID: Cathy Vazquez, female    DOB: 1941/01/24, 73 y.o.   MRN: [id number]  HPI 73 year old female with past history of hypertension and hypercholesterolemia who comes in today for a scheduled follow up.   She states she is doing well.  No cardiac symptoms with increased activity or exertion.  Breathing stable.  Blood pressure on her checks have been averaging 140s/70s.  She has stopped HCTZ and metoprolol.  States she did not tolerate these medications.   No nausea or vomiting.  Bowels stable.  Overall she feels good.  Stays active. She is exercising.  Walking and going to [employer].  Feels better on losartan.       Past Medical History  Diagnosis Date  . Hypertension   . Hypercholesterolemia   . GERD (gastroesophageal reflux disease)   . Glaucoma   . Anemia   . Thyroid nodule   . Diverticulosis     Current Outpatient Prescriptions on File Prior to Visit  Medication Sig Dispense Refill  . aspirin 81 MG tablet Take 81 mg by mouth daily.      . dorzolamide-timolol (COSOPT) 22.3-6.8 MG/ML ophthalmic solution 1 drop 2 (two) times daily.      . fish oil-omega-3 fatty acids 1000 MG capsule Take 1 g by mouth 3 (three) times daily.      . hydrochlorothiazide (HYDRODIURIL) 25 MG tablet Take 1 tablet (25 mg total) by mouth daily.  30 tablet  2  . losartan (COZAAR) 100 MG tablet TAKE 1 TABLET BY MOUTH EVERY DAY  90 tablet  1  . Multiple Vitamin (MULTIVITAMIN) tablet Take 1 tablet by mouth daily.      . niacin 500 MG tablet Three capsules bid      . omeprazole (PRILOSEC) 20 MG capsule Take 1 capsule (20 mg total) by mouth daily.  90 capsule  3   No current facility-administered medications on file prior to visit.    Review of Systems Patient denies any headache, lightheadedness or dizziness.  No sinus or allergy symptoms.  No chest pain, tightness or palpitations.  No increased shortness of breath, cough or congestion.  No nausea or vomiting.  No acid reflux  currently - on prilosec.  No abdominal pain or cramping.  No bowel change, such as diarrhea, constipation, BRBPR or melana.  No urine change.  Knee pain/stiffness as outlined.  Desires no further intervention.  Going to exercise class.         Objective:   Physical Exam  Filed Vitals:   09/03/13 1021  BP: 150/80  Pulse: 76  Temp: 98.2 F (36.8 C)   Blood pressure recheck:  83-35/13  73 year old female in no acute distress.   HEENT:  Nares- clear.  Oropharynx - without lesions. NECK:  Supple.  Nontender.  No audible bruit.  HEART:  Appears to be regular. LUNGS:  No crackles or wheezing audible.  Respirations even and unlabored.  RADIAL PULSE:  Equal bilaterally.  ABDOMEN:  Soft, nontender.  Bowel sounds present and normal.  No audible abdominal bruit.     EXTREMITIES:  No increased edema present.  DP pulses palpable and equal bilaterally.      FEET:  No lesions.        Assessment & Plan:  GI.  Colonoscopy 10/06/06 with internal hemorrhoids and diverticulosis.  Barium enema checked out fine.   CARDIOVASCULAR.  Asymptomatic.    HEALTH MAINTENANCE.  Physical 07/30/13.  Is s/p  hysterectomy.  Does not require yearly pap smears.  Colonoscopy as outlined.  Last mammogram 01/22/13 - Birads I.   Normal bone density 10/05/11.

## 2013-10-02 ENCOUNTER — Other Ambulatory Visit (INDEPENDENT_AMBULATORY_CARE_PROVIDER_SITE_OTHER): Payer: Medicare HMO

## 2013-10-02 DIAGNOSIS — R739 Hyperglycemia, unspecified: Secondary | ICD-10-CM

## 2013-10-02 DIAGNOSIS — R7309 Other abnormal glucose: Secondary | ICD-10-CM

## 2013-10-02 DIAGNOSIS — E7800 Pure hypercholesterolemia, unspecified: Secondary | ICD-10-CM

## 2013-10-02 LAB — LIPID PANEL
CHOL/HDL RATIO: 5
Cholesterol: 235 mg/dL — ABNORMAL HIGH (ref 0–200)
HDL: 43.6 mg/dL (ref >39.00)
LDL Cholesterol: 165 mg/dL — ABNORMAL HIGH (ref 0–99)
NonHDL: 191.4
Triglycerides: 133 mg/dL (ref 0.0–149.0)
VLDL: 26.6 mg/dL (ref 0.0–40.0)

## 2013-10-02 LAB — HEMOGLOBIN A1C: Hgb A1c MFr Bld: 5.8 % (ref 4.6–6.5)

## 2013-10-03 ENCOUNTER — Encounter: Payer: Self-pay | Admitting: *Deleted

## 2013-10-03 LAB — GLUCOSE, FASTING: Glucose, Fasting: 93 mg/dL (ref 70–99)

## 2013-11-12 ENCOUNTER — Encounter: Payer: Self-pay | Admitting: Internal Medicine

## 2013-11-12 ENCOUNTER — Ambulatory Visit (INDEPENDENT_AMBULATORY_CARE_PROVIDER_SITE_OTHER): Payer: Medicare HMO | Admitting: Internal Medicine

## 2013-11-12 VITALS — BP 138/90 | HR 74 | Temp 98.4°F | Ht 62.5 in | Wt 168.5 lb

## 2013-11-12 DIAGNOSIS — I10 Essential (primary) hypertension: Secondary | ICD-10-CM

## 2013-11-12 DIAGNOSIS — L989 Disorder of the skin and subcutaneous tissue, unspecified: Secondary | ICD-10-CM

## 2013-11-12 NOTE — Progress Notes (Signed)
Pre visit review using our clinic review tool, if applicable. No additional management support is needed unless otherwise documented below in the visit note. 

## 2013-11-17 ENCOUNTER — Encounter: Payer: Self-pay | Admitting: Internal Medicine

## 2013-11-17 DIAGNOSIS — L989 Disorder of the skin and subcutaneous tissue, unspecified: Secondary | ICD-10-CM | POA: Insufficient documentation

## 2013-11-17 NOTE — Assessment & Plan Note (Signed)
Has had intolerance to multiple medications.  On Losartan only.  Off metoprolol and hctz.   Follow pressures.   Follow metabolic panel.  Feels better only on losartan.  Her pressures averaging 140/70s.

## 2013-11-17 NOTE — Progress Notes (Signed)
  Subjective:    Patient ID: Cathy Vazquez, female    DOB: 02/20/1941, 73 y.o.   MRN: [id number]  HPI 73 year old female with past history of hypertension and hypercholesterolemia who comes in today as a work in to have a mole evaluated (right cheek).   She states she is doing well.  No cardiac symptoms with increased activity or exertion.  Breathing stable.  Blood pressure on her checks have been averaging 140s/70s.  She has stopped HCTZ and metoprolol.  States she did not tolerate these medications.   No nausea or vomiting.  Bowels stable.  Overall she feels good.  Stays active.   States mole has been present for a while.  Is raised.  No pain.  No recent change, but persistent.     Past Medical History  Diagnosis Date  . Hypertension   . Hypercholesterolemia   . GERD (gastroesophageal reflux disease)   . Glaucoma   . Anemia   . Thyroid nodule   . Diverticulosis     Current Outpatient Prescriptions on File Prior to Visit  Medication Sig Dispense Refill  . aspirin 81 MG tablet Take 81 mg by mouth daily.      . dorzolamide-timolol (COSOPT) 22.3-6.8 MG/ML ophthalmic solution 1 drop 2 (two) times daily.      . fish oil-omega-3 fatty acids 1000 MG capsule Take 1 g by mouth 3 (three) times daily.      . hydrochlorothiazide (HYDRODIURIL) 25 MG tablet Take 1 tablet (25 mg total) by mouth daily.  30 tablet  2  . losartan (COZAAR) 100 MG tablet TAKE 1 TABLET BY MOUTH EVERY DAY  90 tablet  1  . Multiple Vitamin (MULTIVITAMIN) tablet Take 1 tablet by mouth daily.      . niacin 500 MG tablet Three capsules bid      . omeprazole (PRILOSEC) 20 MG capsule Take 1 capsule (20 mg total) by mouth daily.  90 capsule  3   No current facility-administered medications on file prior to visit.    Review of Systems Patient denies any headache, lightheadedness or dizziness.  No sinus or allergy symptoms.  No chest pain, tightness or palpitations.  No increased shortness of breath, cough or congestion.   Persistent lesion - right cheek.         Objective:   Physical Exam  Filed Vitals:   11/12/13 1054  BP: 138/90  Pulse: 74  Temp: 98.4 F (36.9 C)   Blood pressure recheck:  148-66/51-4  73 year old female in no acute distress.  NECK:  Supple.  Nontender.   HEART:  Appears to be regular. LUNGS:  No crackles or wheezing audible.  Respirations even and unlabored.  SKIN:  Raised lesion - right cheek.  No increased erythema.  No pain to palpation.        Assessment & Plan:  HEALTH MAINTENANCE.  Physical 07/30/13.  Is s/p hysterectomy.  Does not require yearly pap smears.  Colonoscopy as outlined.  Last mammogram 01/22/13 - Birads I.   Normal bone density 10/05/11.

## 2013-11-17 NOTE — Assessment & Plan Note (Signed)
Persistent lesion - right cheek.  Refer to dermatology.

## 2013-12-05 ENCOUNTER — Telehealth: Payer: Self-pay | Admitting: Internal Medicine

## 2013-12-05 ENCOUNTER — Ambulatory Visit: Payer: Medicare HMO | Admitting: Internal Medicine

## 2013-12-05 NOTE — Telephone Encounter (Signed)
Pt called to cancel appt for today due to not feeling like driving. Pt has been reschedule. Please advise to cancel off schedule for today.msn

## 2013-12-05 NOTE — Telephone Encounter (Signed)
Okay to cancel. Pt was already been removed from schedule

## 2014-01-08 ENCOUNTER — Other Ambulatory Visit: Payer: Self-pay

## 2014-01-08 MED ORDER — LOSARTAN POTASSIUM 100 MG PO TABS: ORAL_TABLET | ORAL | Status: DC

## 2014-01-08 NOTE — Telephone Encounter (Signed)
Rx sent to pharmacy by escript  

## 2014-01-08 NOTE — Telephone Encounter (Signed)
The patient called and is hoping to get a refill on losartan - she gets this med through [facility]   Pt callback [phone]

## 2014-01-09 ENCOUNTER — Ambulatory Visit: Payer: Medicare HMO | Admitting: Internal Medicine

## 2014-03-27 ENCOUNTER — Ambulatory Visit (INDEPENDENT_AMBULATORY_CARE_PROVIDER_SITE_OTHER): Payer: PPO | Admitting: Internal Medicine

## 2014-03-27 ENCOUNTER — Encounter: Payer: Self-pay | Admitting: Internal Medicine

## 2014-03-27 VITALS — BP 140/100 | HR 80 | Temp 98.2°F | Ht 62.5 in | Wt 166.8 lb

## 2014-03-27 DIAGNOSIS — E7800 Pure hypercholesterolemia, unspecified: Secondary | ICD-10-CM

## 2014-03-27 DIAGNOSIS — E042 Nontoxic multinodular goiter: Secondary | ICD-10-CM

## 2014-03-27 DIAGNOSIS — I10 Essential (primary) hypertension: Secondary | ICD-10-CM

## 2014-03-27 NOTE — Progress Notes (Signed)
Pre visit review using our clinic review tool, if applicable. No additional management support is needed unless otherwise documented below in the visit note. 

## 2014-03-30 ENCOUNTER — Encounter: Payer: Self-pay | Admitting: Internal Medicine

## 2014-03-30 NOTE — Assessment & Plan Note (Signed)
Low cholesterol diet and exercise.  On niacin.  Intolerant to statins.  Follow lipid panel.

## 2014-03-30 NOTE — Assessment & Plan Note (Signed)
Saw Dr Eddie Dibbles.  Everything checked out fine.  Due f/u 18 months after last visit.

## 2014-03-30 NOTE — Assessment & Plan Note (Signed)
Blood pressures as outlined.  Her checks averaging 135-145/60-70.  Have tried various blood pressure medications.  She has had intolerance.  Feels good on her current regimen.  Follow pressures.  Follow metabolic panel.

## 2014-03-30 NOTE — Progress Notes (Signed)
Patient ID: Cathy Vazquez, female   DOB: [birth year], 74 y.o.   MRN: [id number]   Subjective:    Patient ID: Cathy Vazquez, female    DOB: 07/31/1940, 74 y.o.   MRN: [id number]  HPI  Patient here for a scheduled follow up.  Has a history of hypertension and hypercholesterolemia.  States she feels good.  Blood pressures averaging 135-145/60-70.  Staying active.  Denies any cardiac symptoms with increased activity or exertion.  Breathing stable.  Bowels stable.  Overall feels good.     Past Medical History  Diagnosis Date  . Hypertension   . Hypercholesterolemia   . GERD (gastroesophageal reflux disease)   . Glaucoma   . Anemia   . Thyroid nodule   . Diverticulosis     Current Outpatient Prescriptions on File Prior to Visit  Medication Sig Dispense Refill  . aspirin 81 MG tablet Take 81 mg by mouth daily.    . dorzolamide-timolol (COSOPT) 22.3-6.8 MG/ML ophthalmic solution 1 drop 2 (two) times daily.    . fish oil-omega-3 fatty acids 1000 MG capsule Take 1 g by mouth 3 (three) times daily.    Marland Kitchen losartan (COZAAR) 100 MG tablet TAKE 1 TABLET BY MOUTH EVERY DAY 90 tablet 1  . Multiple Vitamin (MULTIVITAMIN) tablet Take 1 tablet by mouth daily.    . niacin 500 MG tablet Three capsules bid     No current facility-administered medications on file prior to visit.    Review of Systems  Constitutional: Negative for fatigue and unexpected weight change.  HENT: Negative for congestion and sinus pressure.   Respiratory: Negative for cough, chest tightness and shortness of breath.   Cardiovascular: Negative for chest pain, palpitations and leg swelling.  Gastrointestinal: Negative for nausea, vomiting, abdominal pain, diarrhea and constipation.  Musculoskeletal: Negative for back pain and joint swelling.  Neurological: Negative for dizziness, light-headedness and headaches.       Objective:     Blood pressure recheck:  148-152/88-90  Physical Exam  HENT:  Nose: Nose normal.    Mouth/Throat: Oropharynx is clear and moist.  Neck: Neck supple. No thyromegaly present.  Cardiovascular: Normal rate and regular rhythm.   Pulmonary/Chest: Breath sounds normal. No respiratory distress. She has no wheezes.  Abdominal: Soft. Bowel sounds are normal. There is no tenderness.  Musculoskeletal: She exhibits no edema or tenderness.  Lymphadenopathy:    She has no cervical adenopathy.    BP 140/100 mmHg  Pulse 80  Temp(Src) 98.2 F (36.8 C) (Oral)  Ht 5' 2.5" (1.588 m)  Wt 166 lb 12 oz (75.637 kg)  BMI 29.99 kg/m2  SpO2 97%  LMP 02/21/1984 Wt Readings from Last 3 Encounters:  03/27/14 166 lb 12 oz (75.637 kg)  11/12/13 168 lb 8 oz (76.431 kg)  09/03/13 175 lb 8 oz (79.606 kg)     Lab Results  Component Value Date   WBC 9.1 05/03/2013   HGB 13.2 05/03/2013   HCT 38.1 05/03/2013   PLT 218.0 05/03/2013   GLUCOSE 117* 08/30/2013   CHOL 235* 10/02/2013   TRIG 133.0 10/02/2013   HDL 43.60 10/02/2013   LDLDIRECT 163.4 10/25/2012   LDLCALC 165* 10/02/2013   ALT 23 08/30/2013   AST 21 08/30/2013   NA 141 08/30/2013   K 4.2 08/30/2013   CL 108 08/30/2013   CREATININE 0.9 08/30/2013   BUN 13 08/30/2013   CO2 26 08/30/2013   TSH 1.42 05/03/2013   HGBA1C 5.8 10/02/2013  Assessment & Plan:   Problem List Items Addressed This Visit    Hypercholesterolemia    Low cholesterol diet and exercise.  On niacin.  Intolerant to statins.  Follow lipid panel.       Hypertension - Primary    Blood pressures as outlined.  Her checks averaging 135-145/60-70.  Have tried various blood pressure medications.  She has had intolerance.  Feels good on her current regimen.  Follow pressures.  Follow metabolic panel.        Multiple thyroid nodules    Saw Dr Eddie Dibbles.  Everything checked out fine.  Due f/u 18 months after last visit.           Einar Pheasant, MD

## 2014-04-01 ENCOUNTER — Other Ambulatory Visit: Payer: Self-pay | Admitting: *Deleted

## 2014-04-01 MED ORDER — LOSARTAN POTASSIUM 100 MG PO TABS: ORAL_TABLET | ORAL | Status: DC

## 2014-04-10 ENCOUNTER — Other Ambulatory Visit (INDEPENDENT_AMBULATORY_CARE_PROVIDER_SITE_OTHER): Payer: PPO

## 2014-04-10 ENCOUNTER — Telehealth: Payer: Self-pay | Admitting: *Deleted

## 2014-04-10 DIAGNOSIS — E042 Nontoxic multinodular goiter: Secondary | ICD-10-CM

## 2014-04-10 DIAGNOSIS — R739 Hyperglycemia, unspecified: Secondary | ICD-10-CM

## 2014-04-10 DIAGNOSIS — I10 Essential (primary) hypertension: Secondary | ICD-10-CM

## 2014-04-10 DIAGNOSIS — E7800 Pure hypercholesterolemia, unspecified: Secondary | ICD-10-CM

## 2014-04-10 LAB — HEPATIC FUNCTION PANEL
ALT: 15 U/L (ref 0–35)
AST: 15 U/L (ref 0–37)
Albumin: 4.3 g/dL (ref 3.5–5.2)
Alkaline Phosphatase: 83 U/L (ref 39–117)
BILIRUBIN DIRECT: 0.1 mg/dL (ref 0.0–0.3)
BILIRUBIN TOTAL: 0.7 mg/dL (ref 0.2–1.2)
Total Protein: 7.4 g/dL (ref 6.0–8.3)

## 2014-04-10 LAB — BASIC METABOLIC PANEL
BUN: 19 mg/dL (ref 6–23)
CALCIUM: 9.4 mg/dL (ref 8.4–10.5)
CHLORIDE: 106 meq/L (ref 96–112)
CO2: 29 mEq/L (ref 19–32)
Creatinine, Ser: 0.92 mg/dL (ref 0.40–1.20)
GFR: 63.14 mL/min (ref >60.00)
Glucose, Bld: 117 mg/dL — ABNORMAL HIGH (ref 70–99)
Potassium: 4.4 mEq/L (ref 3.5–5.1)
SODIUM: 140 meq/L (ref 135–145)

## 2014-04-10 LAB — LIPID PANEL
CHOL/HDL RATIO: 5
Cholesterol: 248 mg/dL — ABNORMAL HIGH (ref 0–200)
HDL: 49.8 mg/dL (ref >39.00)
LDL CALC: 167 mg/dL — AB (ref 0–99)
NonHDL: 198.2
Triglycerides: 157 mg/dL — ABNORMAL HIGH (ref 0.0–149.0)
VLDL: 31.4 mg/dL (ref 0.0–40.0)

## 2014-04-10 LAB — CBC WITH DIFFERENTIAL/PLATELET
BASOS PCT: 0.5 % (ref 0.0–3.0)
Basophils Absolute: 0.1 10*3/uL (ref 0.0–0.1)
Eosinophils Absolute: 0.2 10*3/uL (ref 0.0–0.7)
Eosinophils Relative: 1.8 % (ref 0.0–5.0)
HCT: 42.1 % (ref 36.0–46.0)
HEMOGLOBIN: 14.2 g/dL (ref 12.0–15.0)
LYMPHS PCT: 24.1 % (ref 12.0–46.0)
Lymphs Abs: 2.4 10*3/uL (ref 0.7–4.0)
MCHC: 33.8 g/dL (ref 30.0–36.0)
MCV: 86.2 fl (ref 78.0–100.0)
MONOS PCT: 5.3 % (ref 3.0–12.0)
Monocytes Absolute: 0.5 10*3/uL (ref 0.1–1.0)
NEUTROS ABS: 6.7 10*3/uL (ref 1.4–7.7)
Neutrophils Relative %: 68.3 % (ref 43.0–77.0)
Platelets: 259 10*3/uL (ref 150.0–400.0)
RBC: 4.88 Mil/uL (ref 3.87–5.11)
RDW: 15.1 % (ref 11.5–15.5)
WBC: 9.8 10*3/uL (ref 4.0–10.5)

## 2014-04-10 LAB — HEMOGLOBIN A1C: Hgb A1c MFr Bld: 5.8 % (ref 4.6–6.5)

## 2014-04-10 LAB — TSH: TSH: 3.5 u[IU]/mL (ref 0.35–4.50)

## 2014-04-10 NOTE — Telephone Encounter (Signed)
Orders placed for labs

## 2014-04-10 NOTE — Telephone Encounter (Signed)
Labs and dx?  

## 2014-04-14 ENCOUNTER — Encounter: Payer: Self-pay | Admitting: *Deleted

## 2014-08-04 ENCOUNTER — Ambulatory Visit (INDEPENDENT_AMBULATORY_CARE_PROVIDER_SITE_OTHER): Payer: PPO | Admitting: Internal Medicine

## 2014-08-04 ENCOUNTER — Other Ambulatory Visit: Payer: Self-pay | Admitting: Internal Medicine

## 2014-08-04 ENCOUNTER — Encounter: Payer: Self-pay | Admitting: Internal Medicine

## 2014-08-04 VITALS — BP 138/84 | HR 78 | Temp 98.4°F | Ht 62.5 in | Wt 165.0 lb

## 2014-08-04 DIAGNOSIS — Z1239 Encounter for other screening for malignant neoplasm of breast: Secondary | ICD-10-CM

## 2014-08-04 DIAGNOSIS — E042 Nontoxic multinodular goiter: Secondary | ICD-10-CM

## 2014-08-04 DIAGNOSIS — E2839 Other primary ovarian failure: Secondary | ICD-10-CM | POA: Diagnosis not present

## 2014-08-04 DIAGNOSIS — E7800 Pure hypercholesterolemia, unspecified: Secondary | ICD-10-CM

## 2014-08-04 DIAGNOSIS — I10 Essential (primary) hypertension: Secondary | ICD-10-CM | POA: Diagnosis not present

## 2014-08-04 DIAGNOSIS — Z0000 Encounter for general adult medical examination without abnormal findings: Secondary | ICD-10-CM

## 2014-08-04 NOTE — Progress Notes (Signed)
Patient ID: Cathy Vazquez, female   DOB: [birth year], 74 y.o.   MRN: [id number]   Subjective:    Patient ID: Cathy Vazquez, female    DOB: 11-Aug-1940, 74 y.o.   MRN: [id number]  HPI  Patient here to follow up on these issues as well as for her physical exam.  She is now living with her daughter and her husband.  This is going well for her.  Not exercising as much.  Plans to get back into a regular routine with her exercise.  Some low back pain.  Overall feels is stable.  Worse after she has been sitting for a while and then stands.  No cardiac symptoms with increased activity or exertion.  Breathing stable.  Bowels stable.  Blood pressures averaging 137-148/70s.    Past Medical History  Diagnosis Date  . Hypertension   . Hypercholesterolemia   . GERD (gastroesophageal reflux disease)   . Glaucoma   . Anemia   . Thyroid nodule   . Diverticulosis     Current Outpatient Prescriptions on File Prior to Visit  Medication Sig Dispense Refill  . aspirin 81 MG tablet Take 81 mg by mouth daily.    . dorzolamide-timolol (COSOPT) 22.3-6.8 MG/ML ophthalmic solution 1 drop 2 (two) times daily.    . fish oil-omega-3 fatty acids 1000 MG capsule Take 1 g by mouth 3 (three) times daily.    Marland Kitchen losartan (COZAAR) 100 MG tablet TAKE 1 TABLET BY MOUTH EVERY DAY 90 tablet 1  . Multiple Vitamin (MULTIVITAMIN) tablet Take 1 tablet by mouth daily.    . niacin 500 MG tablet Three capsules bid     No current facility-administered medications on file prior to visit.    Review of Systems  Constitutional: Negative for appetite change and unexpected weight change.  HENT: Negative for congestion and sinus pressure.   Eyes: Negative for pain and visual disturbance.  Respiratory: Negative for cough, chest tightness and shortness of breath.   Cardiovascular: Negative for chest pain, palpitations and leg swelling.  Gastrointestinal: Negative for nausea, vomiting, abdominal pain and diarrhea.  Genitourinary:  Negative for dysuria and difficulty urinating.  Musculoskeletal: Negative for back pain and joint swelling.  Skin: Negative for color change and rash.  Neurological: Negative for dizziness, light-headedness and headaches.  Hematological: Negative for adenopathy. Does not bruise/bleed easily.  Psychiatric/Behavioral: Negative for dysphoric mood and agitation.       Objective:   blood pressure recheck:  150/78   Physical Exam  Constitutional: She is oriented to person, place, and time. She appears well-developed. No distress.  HENT:  Nose: Nose normal.  Mouth/Throat: Oropharynx is clear and moist.  Eyes: Right eye exhibits no discharge. Left eye exhibits no discharge. No scleral icterus.  Neck: Neck supple. No thyromegaly present.  Cardiovascular: Normal rate and regular rhythm.   Pulmonary/Chest: Breath sounds normal. No respiratory distress. She has no wheezes.  Breast exam - bilateral nipple inversion - no change.  Could not appreciate and nodules or axillary adenopathy.    Abdominal: Soft. Bowel sounds are normal. There is no tenderness.  Musculoskeletal: She exhibits no edema or tenderness.  Lymphadenopathy:    She has no cervical adenopathy.  Neurological: She is alert and oriented to person, place, and time.  Skin: No rash noted. No erythema.  Psychiatric: She has a normal mood and affect. Her behavior is normal.    BP 138/84 mmHg  Pulse 78  Temp(Src) 98.4 F (36.9 C) (Oral)  Ht 5'  2.5" (1.588 m)  Wt 165 lb (74.844 kg)  BMI 29.68 kg/m2  SpO2 96%  LMP 02/21/1984 Wt Readings from Last 3 Encounters:  08/04/14 165 lb (74.844 kg)  03/27/14 166 lb 12 oz (75.637 kg)  11/12/13 168 lb 8 oz (76.431 kg)     Lab Results  Component Value Date   WBC 9.8 04/10/2014   HGB 14.2 04/10/2014   HCT 42.1 04/10/2014   PLT 259.0 04/10/2014   GLUCOSE 117* 04/10/2014   CHOL 248* 04/10/2014   TRIG 157.0* 04/10/2014   HDL 49.80 04/10/2014   LDLDIRECT 163.4 10/25/2012   LDLCALC 167*  04/10/2014   ALT 15 04/10/2014   AST 15 04/10/2014   NA 140 04/10/2014   K 4.4 04/10/2014   CL 106 04/10/2014   CREATININE 0.92 04/10/2014   BUN 19 04/10/2014   CO2 29 04/10/2014   TSH 3.50 04/10/2014   HGBA1C 5.8 04/10/2014       Assessment & Plan:   Problem List Items Addressed This Visit    Health care maintenance    Physical today 08/04/14.  Colonoscopy 10/06/06.  Barium enema - ok.  S/p hysterectomy.  Needs mammogram.  Schedule mammogram and bone density.        Hypercholesterolemia    Low cholesterol diet and exercise.  Intolerant to statins.  Follow lipid panel.        Hypertension    Blood pressures averaging 137-148/70s.  Continue same medication regimen.  Follow pressures.  Follow met b.       Multiple thyroid nodules    Saw Dr Eddie Dibbles.  Due f/u in 18 months after last visit.         Other Visit Diagnoses    Screening breast examination    -  Primary    Relevant Orders    MM DIGITAL SCREENING BILATERAL    Estrogen deficiency        Relevant Orders    DG Bone Density      I spent 25 minutes with the patient and more than 50% of the time was spent in consultation regarding the above.     Einar Pheasant, MD

## 2014-08-04 NOTE — Progress Notes (Signed)
Pre visit review using our clinic review tool, if applicable. No additional management support is needed unless otherwise documented below in the visit note. 

## 2014-08-05 ENCOUNTER — Ambulatory Visit: Payer: PPO

## 2014-08-06 ENCOUNTER — Ambulatory Visit: Admission: RE | Admit: 2014-08-06 | Payer: PPO | Source: Ambulatory Visit

## 2014-08-06 ENCOUNTER — Other Ambulatory Visit: Payer: PPO

## 2014-08-11 ENCOUNTER — Telehealth: Payer: Self-pay | Admitting: *Deleted

## 2014-08-11 ENCOUNTER — Encounter: Payer: Self-pay | Admitting: Internal Medicine

## 2014-08-11 DIAGNOSIS — R739 Hyperglycemia, unspecified: Secondary | ICD-10-CM

## 2014-08-11 DIAGNOSIS — E7800 Pure hypercholesterolemia, unspecified: Secondary | ICD-10-CM

## 2014-08-11 DIAGNOSIS — Z0000 Encounter for general adult medical examination without abnormal findings: Secondary | ICD-10-CM | POA: Insufficient documentation

## 2014-08-11 DIAGNOSIS — I10 Essential (primary) hypertension: Secondary | ICD-10-CM

## 2014-08-11 NOTE — Assessment & Plan Note (Signed)
Low cholesterol diet and exercise.  Intolerant to statins.  Follow lipid panel.

## 2014-08-11 NOTE — Telephone Encounter (Signed)
Pt coming tomorrow what labs and dx?

## 2014-08-11 NOTE — Assessment & Plan Note (Signed)
Physical today 08/04/14.  Colonoscopy 10/06/06.  Barium enema - ok.  S/p hysterectomy.  Needs mammogram.  Schedule mammogram and bone density.

## 2014-08-11 NOTE — Assessment & Plan Note (Signed)
Blood pressures averaging 137-148/70s.  Continue same medication regimen.  Follow pressures.  Follow met b.

## 2014-08-11 NOTE — Assessment & Plan Note (Signed)
Saw Dr Eddie Dibbles.  Due f/u in 18 months after last visit.

## 2014-08-11 NOTE — Telephone Encounter (Signed)
Orders placed for labs

## 2014-08-12 ENCOUNTER — Other Ambulatory Visit (INDEPENDENT_AMBULATORY_CARE_PROVIDER_SITE_OTHER): Payer: PPO

## 2014-08-12 DIAGNOSIS — R739 Hyperglycemia, unspecified: Secondary | ICD-10-CM | POA: Diagnosis not present

## 2014-08-12 DIAGNOSIS — I10 Essential (primary) hypertension: Secondary | ICD-10-CM

## 2014-08-12 DIAGNOSIS — E7800 Pure hypercholesterolemia, unspecified: Secondary | ICD-10-CM

## 2014-08-12 LAB — HEPATIC FUNCTION PANEL
ALT: 16 U/L (ref 0–35)
AST: 16 U/L (ref 0–37)
Albumin: 3.9 g/dL (ref 3.5–5.2)
Alkaline Phosphatase: 70 U/L (ref 39–117)
BILIRUBIN TOTAL: 0.5 mg/dL (ref 0.2–1.2)
Bilirubin, Direct: 0.1 mg/dL (ref 0.0–0.3)
Total Protein: 7 g/dL (ref 6.0–8.3)

## 2014-08-12 LAB — BASIC METABOLIC PANEL
BUN: 16 mg/dL (ref 6–23)
CALCIUM: 9.1 mg/dL (ref 8.4–10.5)
CHLORIDE: 105 meq/L (ref 96–112)
CO2: 28 mEq/L (ref 19–32)
CREATININE: 0.78 mg/dL (ref 0.40–1.20)
GFR: 76.32 mL/min (ref >60.00)
Glucose, Bld: 115 mg/dL — ABNORMAL HIGH (ref 70–99)
Potassium: 4.1 mEq/L (ref 3.5–5.1)
Sodium: 139 mEq/L (ref 135–145)

## 2014-08-12 LAB — LIPID PANEL
Cholesterol: 224 mg/dL — ABNORMAL HIGH (ref 0–200)
HDL: 45.2 mg/dL (ref >39.00)
LDL CALC: 151 mg/dL — AB (ref 0–99)
NonHDL: 178.8
TRIGLYCERIDES: 141 mg/dL (ref 0.0–149.0)
Total CHOL/HDL Ratio: 5
VLDL: 28.2 mg/dL (ref 0.0–40.0)

## 2014-08-12 LAB — HEMOGLOBIN A1C: HEMOGLOBIN A1C: 5.6 % (ref 4.6–6.5)

## 2014-08-14 ENCOUNTER — Encounter: Payer: Self-pay | Admitting: *Deleted

## 2014-09-09 ENCOUNTER — Other Ambulatory Visit: Payer: Self-pay | Admitting: Internal Medicine

## 2014-09-09 ENCOUNTER — Ambulatory Visit
Admission: RE | Admit: 2014-09-09 | Discharge: 2014-09-09 | Disposition: A | Discharge status: Home / Self Care | Payer: PPO | Source: Ambulatory Visit | Attending: Internal Medicine | Admitting: Internal Medicine

## 2014-09-09 DIAGNOSIS — E2839 Other primary ovarian failure: Secondary | ICD-10-CM | POA: Insufficient documentation

## 2014-09-09 DIAGNOSIS — Z1231 Encounter for screening mammogram for malignant neoplasm of breast: Secondary | ICD-10-CM | POA: Diagnosis present

## 2014-09-09 DIAGNOSIS — Z1239 Encounter for other screening for malignant neoplasm of breast: Secondary | ICD-10-CM

## 2014-10-03 ENCOUNTER — Other Ambulatory Visit: Payer: Self-pay | Admitting: Internal Medicine

## 2014-12-12 ENCOUNTER — Encounter: Payer: Self-pay | Admitting: Internal Medicine

## 2014-12-12 ENCOUNTER — Ambulatory Visit (INDEPENDENT_AMBULATORY_CARE_PROVIDER_SITE_OTHER): Payer: PPO | Admitting: Internal Medicine

## 2014-12-12 VITALS — BP 160/90 | HR 86 | Temp 98.4°F | Resp 18 | Ht 62.5 in | Wt 164.4 lb

## 2014-12-12 DIAGNOSIS — E042 Nontoxic multinodular goiter: Secondary | ICD-10-CM

## 2014-12-12 DIAGNOSIS — R739 Hyperglycemia, unspecified: Secondary | ICD-10-CM

## 2014-12-12 DIAGNOSIS — E7800 Pure hypercholesterolemia, unspecified: Secondary | ICD-10-CM | POA: Diagnosis not present

## 2014-12-12 DIAGNOSIS — Z1211 Encounter for screening for malignant neoplasm of colon: Secondary | ICD-10-CM

## 2014-12-12 DIAGNOSIS — I10 Essential (primary) hypertension: Secondary | ICD-10-CM

## 2014-12-12 NOTE — Progress Notes (Signed)
Patient ID: Cathy Vazquez, female   DOB: [birth year], 74 y.o.   MRN: [id number]   Subjective:    Patient ID: Cathy Vazquez, female    DOB: December 08, 1940, 74 y.o.   MRN: [id number]  HPI  Patient with past history of hypercholesterolemia, GERD and hypertension who comes in today to follow up on these issues.  She feels good.  Brought in blood pressure readings.  Her blood pressure on outside checks are averaging 143-148/70s.  Tries to stay active.  No cardiac symptoms with increased activity or exertion.  No sob.  No acid reflux.  No abdominal pain or cramping.  Bowels stable. Living with her daughter and her husband.  Adjusting better to this now.     Past Medical History  Diagnosis Date  . Hypertension   . Hypercholesterolemia   . GERD (gastroesophageal reflux disease)   . Glaucoma   . Anemia   . Thyroid nodule   . Diverticulosis    Past Surgical History  Procedure Laterality Date  . Abdominal hysterectomy  1987    fibroids, ovaries not removed  . Tonsillectomy     Family History  Problem Relation Age of Onset  . Diabetes Father   . Hypertension Father   . Hypercholesterolemia Father   . Heart disease Father     CABG  . Hypertension Mother   . Hypercholesterolemia Mother   . Rheumatic fever Mother   . Hypertension Brother   . Diabetes Sister   . Breast cancer Maternal Aunt   . Breast cancer      cousin  . Colon cancer Neg Hx    Social History   Social History  . Marital Status: Divorced    Spouse Name: N/A  . Number of Children: 2  . Years of Education: N/A   Social History Main Topics  . Smoking status: Never Smoker   . Smokeless tobacco: Never Used  . Alcohol Use: No  . Drug Use: No  . Sexual Activity: Not Asked   Other Topics Concern  . None   Social History Narrative    Outpatient Encounter Prescriptions as of 12/12/2014  Medication Sig  . aspirin 81 MG tablet Take 81 mg by mouth daily.  . dorzolamide-timolol (COSOPT) 22.3-6.8 MG/ML ophthalmic  solution 1 drop 2 (two) times daily.  . fish oil-omega-3 fatty acids 1000 MG capsule Take 1 g by mouth 3 (three) times daily.  Marland Kitchen losartan (COZAAR) 100 MG tablet TAKE 1 TABLET BY MOUTH EVERY DAY  . Multiple Vitamin (MULTIVITAMIN) tablet Take 1 tablet by mouth daily.  . niacin 500 MG tablet Three capsules bid   No facility-administered encounter medications on file as of 12/12/2014.    Review of Systems  Constitutional: Negative for appetite change and unexpected weight change.  HENT: Negative for congestion and sinus pressure.   Respiratory: Negative for cough, chest tightness and shortness of breath.   Cardiovascular: Negative for chest pain, palpitations and leg swelling.  Gastrointestinal: Negative for nausea, vomiting, abdominal pain and diarrhea.  Genitourinary: Negative for dysuria and difficulty urinating.  Musculoskeletal: Negative for back pain and joint swelling.  Skin: Negative for color change and rash.  Neurological: Negative for dizziness, light-headedness and headaches.  Psychiatric/Behavioral: Negative for dysphoric mood and agitation.       Objective:     Blood pressure rechecked by me:  148/78  Physical Exam  Constitutional: She appears well-developed and well-nourished. No distress.  HENT:  Nose: Nose normal.  Mouth/Throat: Oropharynx is clear and  moist.  Eyes: Conjunctivae are normal. Right eye exhibits no discharge. Left eye exhibits no discharge.  Neck: Neck supple. No thyromegaly present.  Cardiovascular: Normal rate and regular rhythm.   Pulmonary/Chest: Breath sounds normal. No respiratory distress. She has no wheezes.  Abdominal: Soft. Bowel sounds are normal. There is no tenderness.  Musculoskeletal: She exhibits no edema or tenderness.  Lymphadenopathy:    She has no cervical adenopathy.  Skin: No rash noted. No erythema.  Psychiatric: She has a normal mood and affect. Her behavior is normal.    BP 160/90 mmHg  Pulse 86  Temp(Src) 98.4 F (36.9  C) (Oral)  Resp 18  Ht 5' 2.5" (1.588 m)  Wt 164 lb 6 oz (74.56 kg)  BMI 29.57 kg/m2  SpO2 96%  LMP 02/21/1984 Wt Readings from Last 3 Encounters:  12/12/14 164 lb 6 oz (74.56 kg)  08/04/14 165 lb (74.844 kg)  03/27/14 166 lb 12 oz (75.637 kg)     Lab Results  Component Value Date   WBC 9.8 04/10/2014   HGB 14.2 04/10/2014   HCT 42.1 04/10/2014   PLT 259.0 04/10/2014   GLUCOSE 115* 08/12/2014   CHOL 224* 08/12/2014   TRIG 141.0 08/12/2014   HDL 45.20 08/12/2014   LDLDIRECT 163.4 10/25/2012   LDLCALC 151* 08/12/2014   ALT 16 08/12/2014   AST 16 08/12/2014   NA 139 08/12/2014   K 4.1 08/12/2014   CL 105 08/12/2014   CREATININE 0.78 08/12/2014   BUN 16 08/12/2014   CO2 28 08/12/2014   TSH 3.50 04/10/2014   HGBA1C 5.6 08/12/2014    Dg Bone Density  09/09/2014  EXAM: DXA BONE DENSITY STUDY The [facility] hard-copy report (which includes all data, graphical display, and FRAX results when applicable) has been sent directly to the ordering physician. This report can also be obtained electronically by viewing images for this exam through the performing facility's EMR, or by logging directly into [employer]. Electronically Signed   By: Fidela Salisbury M.D.   On: 09/09/2014 14:24   Mm Screening Breast Tomo Bilateral  09/09/2014  CLINICAL DATA:  Screening. EXAM: DIGITAL SCREENING BILATERAL MAMMOGRAM WITH 3D TOMO WITH CAD COMPARISON:  Previous exam(s). ACR Breast Density Category d: The breast tissue is extremely dense, which lowers the sensitivity of mammography. FINDINGS: There are no findings suspicious for malignancy. Images were processed with CAD. IMPRESSION: No mammographic evidence of malignancy. A result letter of this screening mammogram will be mailed directly to the patient. RECOMMENDATION: Screening mammogram in one year. (Code:SM-B-01Y) BI-RADS CATEGORY  1: Negative. Electronically Signed   By: Pamelia Hoit M.D.   On: 09/09/2014 16:48       Assessment  & Plan:   Problem List Items Addressed This Visit    Hypercholesterolemia    Low cholesterol diet and exercise.  Follow lipid panel.  On niacin.       Relevant Orders   Lipid panel   Hepatic function panel   Hyperglycemia    Low carb diet and exercise.  Follow met b and a1c.       Relevant Orders   Hemoglobin A1c   Hypertension    Blood pressure as outlined.  Same medication regimen.  Has had intolerance to multiple blood pressure medications previously.  Follow.  Follow met b.       Relevant Orders   Basic metabolic panel   Multiple thyroid nodules    Saw Dr Eddie Dibbles.  States has been released.  Follow.  Follow thyroid function test.        Other Visit Diagnoses    Colon cancer screening    -  Primary    Relevant Orders    Fecal occult blood, imunochemical        Einar Pheasant, MD

## 2014-12-12 NOTE — Progress Notes (Signed)
Pre-visit discussion using our clinic review tool. No additional management support is needed unless otherwise documented below in the visit note.  

## 2014-12-14 ENCOUNTER — Encounter: Payer: Self-pay | Admitting: Internal Medicine

## 2014-12-14 DIAGNOSIS — R739 Hyperglycemia, unspecified: Secondary | ICD-10-CM | POA: Insufficient documentation

## 2014-12-14 NOTE — Assessment & Plan Note (Signed)
Low cholesterol diet and exercise.  Follow lipid panel.  On niacin.

## 2014-12-14 NOTE — Assessment & Plan Note (Signed)
Low carb diet and exercise.  Follow met b and a1c.  

## 2014-12-14 NOTE — Assessment & Plan Note (Signed)
Saw Dr Eddie Dibbles.  States has been released.  Follow.  Follow thyroid function test.

## 2014-12-14 NOTE — Assessment & Plan Note (Signed)
Blood pressure as outlined.  Same medication regimen.  Has had intolerance to multiple blood pressure medications previously.  Follow.  Follow met b.

## 2014-12-26 ENCOUNTER — Other Ambulatory Visit (INDEPENDENT_AMBULATORY_CARE_PROVIDER_SITE_OTHER): Payer: PPO

## 2014-12-26 DIAGNOSIS — R739 Hyperglycemia, unspecified: Secondary | ICD-10-CM

## 2014-12-26 DIAGNOSIS — E7800 Pure hypercholesterolemia, unspecified: Secondary | ICD-10-CM | POA: Diagnosis not present

## 2014-12-26 DIAGNOSIS — I10 Essential (primary) hypertension: Secondary | ICD-10-CM | POA: Diagnosis not present

## 2014-12-26 LAB — LIPID PANEL
Cholesterol: 223 mg/dL — ABNORMAL HIGH (ref 0–200)
HDL: 43.4 mg/dL (ref >39.00)
LDL Cholesterol: 153 mg/dL — ABNORMAL HIGH (ref 0–99)
NonHDL: 179.33
TRIGLYCERIDES: 131 mg/dL (ref 0.0–149.0)
Total CHOL/HDL Ratio: 5
VLDL: 26.2 mg/dL (ref 0.0–40.0)

## 2014-12-26 LAB — BASIC METABOLIC PANEL
BUN: 16 mg/dL (ref 6–23)
CO2: 26 mEq/L (ref 19–32)
CREATININE: 0.91 mg/dL (ref 0.40–1.20)
Calcium: 9.5 mg/dL (ref 8.4–10.5)
Chloride: 105 mEq/L (ref 96–112)
GFR: 63.82 mL/min (ref >60.00)
Glucose, Bld: 105 mg/dL — ABNORMAL HIGH (ref 70–99)
Potassium: 4.3 mEq/L (ref 3.5–5.1)
Sodium: 139 mEq/L (ref 135–145)

## 2014-12-26 LAB — HEPATIC FUNCTION PANEL
ALBUMIN: 4.1 g/dL (ref 3.5–5.2)
ALK PHOS: 71 U/L (ref 39–117)
ALT: 17 U/L (ref 0–35)
AST: 20 U/L (ref 0–37)
Bilirubin, Direct: 0.1 mg/dL (ref 0.0–0.3)
Total Bilirubin: 0.7 mg/dL (ref 0.2–1.2)
Total Protein: 7.1 g/dL (ref 6.0–8.3)

## 2014-12-26 LAB — HEMOGLOBIN A1C: HEMOGLOBIN A1C: 5.5 % (ref 4.6–6.5)

## 2014-12-29 ENCOUNTER — Other Ambulatory Visit (INDEPENDENT_AMBULATORY_CARE_PROVIDER_SITE_OTHER): Payer: PPO

## 2014-12-29 DIAGNOSIS — Z1211 Encounter for screening for malignant neoplasm of colon: Secondary | ICD-10-CM | POA: Diagnosis not present

## 2014-12-29 LAB — FECAL OCCULT BLOOD, IMMUNOCHEMICAL: Fecal Occult Bld: NEGATIVE

## 2014-12-30 ENCOUNTER — Encounter: Payer: Self-pay | Admitting: *Deleted

## 2015-02-26 ENCOUNTER — Ambulatory Visit (INDEPENDENT_AMBULATORY_CARE_PROVIDER_SITE_OTHER): Payer: PPO

## 2015-02-26 VITALS — BP 138/90 | HR 82 | Temp 97.7°F | Resp 12 | Ht 62.5 in | Wt 165.0 lb

## 2015-02-26 DIAGNOSIS — Z0000 Encounter for general adult medical examination without abnormal findings: Secondary | ICD-10-CM

## 2015-02-26 NOTE — Progress Notes (Signed)
Subjective:   Julitza Vazquez is a 75 y.o. female who presents for an Initial Medicare Annual Wellness Visit.  Review of Systems    No ROS.  Medicare Wellness Visit.  Cardiac Risk Factors include: advanced age (>79men, >71 women);hypertension     Objective:    Today's Vitals   02/26/15 1534  BP: 138/90  Pulse: 82  Temp: 97.7 F (36.5 C)  TempSrc: Oral  Resp: 12  Height: 5' 2.5" (1.588 m)  Weight: 165 lb (74.844 kg)  SpO2: 97%    Current Medications (verified) Outpatient Encounter Prescriptions as of 02/26/2015  Medication Sig  . aspirin 81 MG tablet Take 81 mg by mouth daily.  . dorzolamide-timolol (COSOPT) 22.3-6.8 MG/ML ophthalmic solution 1 drop 2 (two) times daily.  . fish oil-omega-3 fatty acids 1000 MG capsule Take 1 g by mouth 3 (three) times daily.  Marland Kitchen losartan (COZAAR) 100 MG tablet TAKE 1 TABLET BY MOUTH EVERY DAY  . Multiple Vitamin (MULTIVITAMIN) tablet Take 1 tablet by mouth daily.  . niacin 500 MG tablet Three capsules bid   No facility-administered encounter medications on file as of 02/26/2015.    Allergies (verified) Norvasc; Contrast media; Benicar; Codeine; Felodipine; Lipitor; Stay awake; and Zocor   History: Past Medical History  Diagnosis Date  . Hypertension   . Hypercholesterolemia   . GERD (gastroesophageal reflux disease)   . Glaucoma   . Anemia   . Thyroid nodule   . Diverticulosis    Past Surgical History  Procedure Laterality Date  . Abdominal hysterectomy  1987    fibroids, ovaries not removed  . Tonsillectomy     Family History  Problem Relation Age of Onset  . Diabetes Father   . Hypertension Father   . Hypercholesterolemia Father   . Heart disease Father     CABG  . Hypertension Mother   . Hypercholesterolemia Mother   . Rheumatic fever Mother   . Hypertension Brother   . Diabetes Sister   . Breast cancer Maternal Aunt   . Breast cancer      cousin  . Colon cancer Neg Hx    Social History   Occupational  History  . Not on file.   Social History Main Topics  . Smoking status: Never Smoker   . Smokeless tobacco: Never Used  . Alcohol Use: No  . Drug Use: No  . Sexual Activity: Not Currently    Tobacco Counseling Counseling given: Not Answered   Activities of Daily Living In your present state of health, do you have any difficulty performing the following activities: 02/26/2015  Hearing? N  Vision? N  Difficulty concentrating or making decisions? N  Walking or climbing stairs? Y  Dressing or bathing? N  Doing errands, shopping? N  Preparing Food and eating ? N  Using the Toilet? N  In the past six months, have you accidently leaked urine? N  Do you have problems with loss of bowel control? N  Managing your Medications? N  Managing your Finances? N  Housekeeping or managing your Housekeeping? N    Immunizations and Health Maintenance Immunization History  Administered Date(s) Administered  . Influenza Split 10/29/2012  . Pneumococcal Conjugate-13 10/29/2012  . Zoster 02/21/2009   Health Maintenance Due  Topic Date Due  . TETANUS/TDAP  08/15/1957  . PNA vac Low Risk Adult (2 of 2 - PPSV23) 10/29/2013    Patient Care Team: Einar Pheasant, MD as PCP - General (Internal Medicine)  Indicate any recent Medical  Services you may have received from other than [facility] providers in the past year (date may be approximate).     Assessment:   This is a routine wellness examination for Cathy Vazquez.  The goal of the wellness visit is to assist the patient how to close the gaps in care and create a preventative care plan for the patient.   Osteoporosis risk reviewed.    Medications reviewed; taking without issues or barriers.   Safety issues reviewed; smoke detectors in the home. No firearms in the home. Wears seatbelts when driving or riding with others. No violence in the home.   No identified risk were noted; The patient was oriented x 3; appropriate in dress and manner and no objective  failures at ADL's or IADL's.   Pneumococcal Polysaccharide Vaccine 23 postponed for upcoming follow up with PCP, per patient request.    TDAP vaccine postponed for insurance follow up. Education provided.   Patient Concerns: None at this time.  Follow up with PCP as needed.   Hearing/Vision screen Hearing Screening Comments: Passed the whisper test Vision Screening Comments: Followed by [facility] visits High pressure Wears glasses Bilateral cataracts removed  Dietary issues and exercise activities discussed: Current Exercise Habits:: The patient does not participate in regular exercise at present  Goals    . Increase physical activity     Start chair exercises during winter months, as demonstrated.  Education provided.  [place] exercise classes 3x weekly when program restarts.    . Increase water intake     Currently drinks 6 cups water daily.  Increase up to 2 cups=8 cups daily      Depression Screen PHQ 2/9 Scores 02/26/2015 08/04/2014 09/03/2013 07/30/2013 07/29/2012 04/28/2012  PHQ - 2 Score 0 0 0 0 0 0    Fall Risk Fall Risk  02/26/2015 08/04/2014 09/03/2013 07/30/2013 07/29/2012  Falls in the past year? Yes No No No No  Number falls in past yr: 1 - - - -  Follow up Education provided;Falls prevention discussed - - - -    Cognitive Function: MMSE - Mini Mental State Exam 02/26/2015  Orientation to time 5  Orientation to Place 5  Registration 3  Attention/ Calculation 5  Recall 3  Language- name 2 objects 2  Language- repeat 1  Language- follow 3 step command 3  Language- read & follow direction 1  Write a sentence 1  Copy design 1  Total score 30    Screening Tests Health Maintenance  Topic Date Due  . TETANUS/TDAP  08/15/1957  . PNA vac Low Risk Adult (2 of 2 - PPSV23) 10/29/2013  . INFLUENZA VACCINE  05/22/2015 (Originally 09/22/2014)  . MAMMOGRAM  09/09/2015  . DEXA SCAN  Completed  . ZOSTAVAX  Completed      Plan:    End of life planning; Advance aging; Advanced directives discussed. Copy requested of current HCPOA/Living Will.   Return in February for 4 month follow up wit Dr. Nicki Reaper   During the course of the visit, Cathy Vazquez was educated and counseled about the following appropriate screening and preventive services:   Vaccines to include Pneumoccal, Influenza, Hepatitis B, Td, Zostavax, HCV  Electrocardiogram  Cardiovascular disease screening  Colorectal cancer screening  Bone density screening  Diabetes screening  Glaucoma screening  Mammography/PAP  Nutrition counseling  Smoking cessation counseling  Patient Instructions (the written plan) were given to the patient.    Varney Biles, LPN   [phone]  Reviewed above information.  Agree with plan.   Dr Nicki Reaper

## 2015-02-26 NOTE — Patient Instructions (Addendum)
Cathy Vazquez,  Thank you for taking time to come for your Medicare Wellness Visit.  I appreciate your ongoing commitment to your health goals. Please review the following plan we discussed and let me know if I can assist you in the future.  Return in February for 4 month follow up with Dr. Nicki Reaper.  Happy New Year!  Health Maintenance, Female Adopting a healthy lifestyle and getting preventive care can go a long way to promote health and wellness. Talk with your health care provider about what schedule of regular examinations is right for you. This is a good chance for you to check in with your provider about disease prevention and staying healthy. In between checkups, there are plenty of things you can do on your own. Experts have done a lot of research about which lifestyle changes and preventive measures are most likely to keep you healthy. Ask your health care provider for more information. WEIGHT AND DIET  Eat a healthy diet  Be sure to include plenty of vegetables, fruits, low-fat dairy products, and lean protein.  Do not eat a lot of foods high in solid fats, added sugars, or salt.  Get regular exercise. This is one of the most important things you can do for your health.  Most adults should exercise for at least 150 minutes each week. The exercise should increase your heart rate and make you sweat (moderate-intensity exercise).  Most adults should also do strengthening exercises at least twice a week. This is in addition to the moderate-intensity exercise.  Maintain a healthy weight  Body mass index (BMI) is a measurement that can be used to identify possible weight problems. It estimates body fat based on height and weight. Your health care provider can help determine your BMI and help you achieve or maintain a healthy weight.  For females 67 years of age and older:   A BMI below 18.5 is considered underweight.  A BMI of 18.5 to 24.9 is normal.  A BMI of 25 to 29.9 is  considered overweight.  A BMI of 30 and above is considered obese.  Watch levels of cholesterol and blood lipids  You should start having your blood tested for lipids and cholesterol at 77 years of age, then have this test every 5 years.  You may need to have your cholesterol levels checked more often if:  Your lipid or cholesterol levels are high.  You are older than 77 years of age.  You are at high risk for heart disease.  CANCER SCREENING   Lung Cancer  Lung cancer screening is recommended for adults 67-32 years old who are at high risk for lung cancer because of a history of smoking.  A yearly low-dose CT scan of the lungs is recommended for people who:  Currently smoke.  Have quit within the past 15 years.  Have at least a 30-pack-year history of smoking. A pack year is smoking an average of one pack of cigarettes a day for 1 year.  Yearly screening should continue until it has been 15 years since you quit.  Yearly screening should stop if you develop a health problem that would prevent you from having lung cancer treatment.  Breast Cancer  Practice breast self-awareness. This means understanding how your breasts normally appear and feel.  It also means doing regular breast self-exams. Let your health care provider know about any changes, no matter how small.  If you are in your 20s or 30s, you should have a clinical  breast exam (CBE) by a health care provider every 1-3 years as part of a regular health exam.  If you are 28 or older, have a CBE every year. Also consider having a breast X-ray (mammogram) every year.  If you have a family history of breast cancer, talk to your health care provider about genetic screening.  If you are at high risk for breast cancer, talk to your health care provider about having an MRI and a mammogram every year.  Breast cancer gene (BRCA) assessment is recommended for women who have family members with BRCA-related cancers.  BRCA-related cancers include:  Breast.  Ovarian.  Tubal.  Peritoneal cancers.  Results of the assessment will determine the need for genetic counseling and BRCA1 and BRCA2 testing. Cervical Cancer Your health care provider may recommend that you be screened regularly for cancer of the pelvic organs (ovaries, uterus, and vagina). This screening involves a pelvic examination, including checking for microscopic changes to the surface of your cervix (Pap test). You may be encouraged to have this screening done every 3 years, beginning at age 56.  For women ages 62-65, health care providers may recommend pelvic exams and Pap testing every 3 years, or they may recommend the Pap and pelvic exam, combined with testing for human papilloma virus (HPV), every 5 years. Some types of HPV increase your risk of cervical cancer. Testing for HPV may also be done on women of any age with unclear Pap test results.  Other health care providers may not recommend any screening for nonpregnant women who are considered low risk for pelvic cancer and who do not have symptoms. Ask your health care provider if a screening pelvic exam is right for you.  If you have had past treatment for cervical cancer or a condition that could lead to cancer, you need Pap tests and screening for cancer for at least 20 years after your treatment. If Pap tests have been discontinued, your risk factors (such as having a new sexual partner) need to be reassessed to determine if screening should resume. Some women have medical problems that increase the chance of getting cervical cancer. In these cases, your health care provider may recommend more frequent screening and Pap tests. Colorectal Cancer  This type of cancer can be detected and often prevented.  Routine colorectal cancer screening usually begins at 77 years of age and continues through 77 years of age.  Your health care provider may recommend screening at an earlier age if you  have risk factors for colon cancer.  Your health care provider may also recommend using home test kits to check for hidden blood in the stool.  A small camera at the end of a tube can be used to examine your colon directly (sigmoidoscopy or colonoscopy). This is done to check for the earliest forms of colorectal cancer.  Routine screening usually begins at age 50.  Direct examination of the colon should be repeated every 5-10 years through 77 years of age. However, you may need to be screened more often if early forms of precancerous polyps or small growths are found. Skin Cancer  Check your skin from head to toe regularly.  Tell your health care provider about any new moles or changes in moles, especially if there is a change in a mole's shape or color.  Also tell your health care provider if you have a mole that is larger than the size of a pencil eraser.  Always use sunscreen. Apply sunscreen liberally and  repeatedly throughout the day.  Protect yourself by wearing long sleeves, pants, a wide-brimmed hat, and sunglasses whenever you are outside. HEART DISEASE, DIABETES, AND HIGH BLOOD PRESSURE   High blood pressure causes heart disease and increases the risk of stroke. High blood pressure is more likely to develop in:  People who have blood pressure in the high end of the normal range (130-139/85-89 mm Hg).  People who are overweight or obese.  People who are African American.  If you are 69-70 years of age, have your blood pressure checked every 3-5 years. If you are 16 years of age or older, have your blood pressure checked every year. You should have your blood pressure measured twice--once when you are at a hospital or clinic, and once when you are not at a hospital or clinic. Record the average of the two measurements. To check your blood pressure when you are not at a hospital or clinic, you can use:  An automated blood pressure machine at a pharmacy.  A home blood pressure  monitor.  If you are between 49 years and 69 years old, ask your health care provider if you should take aspirin to prevent strokes.  Have regular diabetes screenings. This involves taking a blood sample to check your fasting blood sugar level.  If you are at a normal weight and have a low risk for diabetes, have this test once every three years after 77 years of age.  If you are overweight and have a high risk for diabetes, consider being tested at a younger age or more often. PREVENTING INFECTION  Hepatitis B  If you have a higher risk for hepatitis B, you should be screened for this virus. You are considered at high risk for hepatitis B if:  You were born in a country where hepatitis B is common. Ask your health care provider which countries are considered high risk.  Your parents were born in a high-risk country, and you have not been immunized against hepatitis B (hepatitis B vaccine).  You have HIV or AIDS.  You use needles to inject street drugs.  You live with someone who has hepatitis B.  You have had sex with someone who has hepatitis B.  You get hemodialysis treatment.  You take certain medicines for conditions, including cancer, organ transplantation, and autoimmune conditions. Hepatitis C  Blood testing is recommended for:  Everyone born from [birth year] through 1967.  Anyone with known risk factors for hepatitis C. Sexually transmitted infections (STIs)  You should be screened for sexually transmitted infections (STIs) including gonorrhea and chlamydia if:  You are sexually active and are younger than 77 years of age.  You are older than 77 years of age and your health care provider tells you that you are at risk for this type of infection.  Your sexual activity has changed since you were last screened and you are at an increased risk for chlamydia or gonorrhea. Ask your health care provider if you are at risk.  If you do not have HIV, but are at risk, it may be  recommended that you take a prescription medicine daily to prevent HIV infection. This is called pre-exposure prophylaxis (PrEP). You are considered at risk if:  You are sexually active and do not regularly use condoms or know the HIV status of your partner(s).  You take drugs by injection.  You are sexually active with a partner who has HIV. Talk with your health care provider about whether you are at  high risk of being infected with HIV. If you choose to begin PrEP, you should first be tested for HIV. You should then be tested every 3 months for as long as you are taking PrEP.  PREGNANCY   If you are premenopausal and you may become pregnant, ask your health care provider about preconception counseling.  If you may become pregnant, take 400 to 800 micrograms (mcg) of folic acid every day.  If you want to prevent pregnancy, talk to your health care provider about birth control (contraception). OSTEOPOROSIS AND MENOPAUSE   Osteoporosis is a disease in which the bones lose minerals and strength with aging. This can result in serious bone fractures. Your risk for osteoporosis can be identified using a bone density scan.  If you are 15 years of age or older, or if you are at risk for osteoporosis and fractures, ask your health care provider if you should be screened.  Ask your health care provider whether you should take a calcium or vitamin D supplement to lower your risk for osteoporosis.  Menopause may have certain physical symptoms and risks.  Hormone replacement therapy may reduce some of these symptoms and risks. Talk to your health care provider about whether hormone replacement therapy is right for you.  HOME CARE INSTRUCTIONS   Schedule regular health, dental, and eye exams.  Stay current with your immunizations.   Do not use any tobacco products including cigarettes, chewing tobacco, or electronic cigarettes.  If you are pregnant, do not drink alcohol.  If you are  breastfeeding, limit how much and how often you drink alcohol.  Limit alcohol intake to no more than 1 drink per day for nonpregnant women. One drink equals 12 ounces of beer, 5 ounces of wine, or 1 ounces of hard liquor.  Do not use street drugs.  Do not share needles.  Ask your health care provider for help if you need support or information about quitting drugs.  Tell your health care provider if you often feel depressed.  Tell your health care provider if you have ever been abused or do not feel safe at home.   This information is not intended to replace advice given to you by your health care provider. Make sure you discuss any questions you have with your health care provider.   Document Released: 08/23/2010 Document Revised: 02/28/2014 Document Reviewed: 01/09/2013 Elsevier Interactive Patient Education 2016 [place] in the Home  Falls can cause injuries. They can happen to people of all ages. There are many things you can do to make your home safe and to help prevent falls.  WHAT CAN I DO ON THE OUTSIDE OF MY HOME?  Regularly fix the edges of walkways and driveways and fix any cracks.  Remove anything that might make you trip as you walk through a door, such as a raised step or threshold.  Trim any bushes or trees on the path to your home.  Use bright outdoor lighting.  Clear any walking paths of anything that might make someone trip, such as rocks or tools.  Regularly check to see if handrails are loose or broken. Make sure that both sides of any steps have handrails.  Any raised decks and porches should have guardrails on the edges.  Have any leaves, snow, or ice cleared regularly.  Use sand or salt on walking paths during winter.  Clean up any spills in your garage right away. This includes oil or grease spills. WHAT CAN I  DO IN THE BATHROOM?   Use night lights.  Install grab bars by the toilet and in the tub and shower. Do not use  towel bars as grab bars.  Use non-skid mats or decals in the tub or shower.  If you need to sit down in the shower, use a plastic, non-slip stool.  Keep the floor dry. Clean up any water that spills on the floor as soon as it happens.  Remove soap buildup in the tub or shower regularly.  Attach bath mats securely with double-sided non-slip rug tape.  Do not have throw rugs and other things on the floor that can make you trip. WHAT CAN I DO IN THE BEDROOM?  Use night lights.  Make sure that you have a light by your bed that is easy to reach.  Do not use any sheets or blankets that are too big for your bed. They should not hang down onto the floor.  Have a firm chair that has side arms. You can use this for support while you get dressed.  Do not have throw rugs and other things on the floor that can make you trip. WHAT CAN I DO IN THE KITCHEN?  Clean up any spills right away.  Avoid walking on wet floors.  Keep items that you use a lot in easy-to-reach places.  If you need to reach something above you, use a strong step stool that has a grab bar.  Keep electrical cords out of the way.  Do not use floor polish or wax that makes floors slippery. If you must use wax, use non-skid floor wax.  Do not have throw rugs and other things on the floor that can make you trip. WHAT CAN I DO WITH MY STAIRS?  Do not leave any items on the stairs.  Make sure that there are handrails on both sides of the stairs and use them. Fix handrails that are broken or loose. Make sure that handrails are as long as the stairways.  Check any carpeting to make sure that it is firmly attached to the stairs. Fix any carpet that is loose or worn.  Avoid having throw rugs at the top or bottom of the stairs. If you do have throw rugs, attach them to the floor with carpet tape.  Make sure that you have a light switch at the top of the stairs and the bottom of the stairs. If you do not have them, ask  someone to add them for you. WHAT ELSE CAN I DO TO HELP PREVENT FALLS?  Wear shoes that:  Do not have high heels.  Have rubber bottoms.  Are comfortable and fit you well.  Are closed at the toe. Do not wear sandals.  If you use a stepladder:  Make sure that it is fully opened. Do not climb a closed stepladder.  Make sure that both sides of the stepladder are locked into place.  Ask someone to hold it for you, if possible.  Clearly mark and make sure that you can see:  Any grab bars or handrails.  First and last steps.  Where the edge of each step is.  Use tools that help you move around (mobility aids) if they are needed. These include:  Canes.  Walkers.  Scooters.  Crutches.  Turn on the lights when you go into a dark area. Replace any light bulbs as soon as they burn out.  Set up your furniture so you have a clear path. Avoid moving  your furniture around.  If any of your floors are uneven, fix them.  If there are any pets around you, be aware of where they are.  Review your medicines with your doctor. Some medicines can make you feel dizzy. This can increase your chance of falling. Ask your doctor what other things that you can do to help prevent falls.   This information is not intended to replace advice given to you by your health care provider. Make sure you discuss any questions you have with your health care provider.   Document Released: 12/04/2008 Document Revised: 06/24/2014 Document Reviewed: 03/14/2014 Elsevier Interactive Patient Education [employer].

## 2015-03-16 ENCOUNTER — Ambulatory Visit: Payer: PPO | Admitting: Internal Medicine

## 2015-03-27 DIAGNOSIS — R319 Hematuria, unspecified: Secondary | ICD-10-CM | POA: Diagnosis not present

## 2015-03-27 DIAGNOSIS — N390 Urinary tract infection, site not specified: Secondary | ICD-10-CM | POA: Diagnosis not present

## 2015-03-27 DIAGNOSIS — Z885 Allergy status to narcotic agent status: Secondary | ICD-10-CM | POA: Diagnosis not present

## 2015-03-27 DIAGNOSIS — I10 Essential (primary) hypertension: Secondary | ICD-10-CM | POA: Diagnosis not present

## 2015-03-27 DIAGNOSIS — Z888 Allergy status to other drugs, medicaments and biological substances status: Secondary | ICD-10-CM | POA: Diagnosis not present

## 2015-03-27 DIAGNOSIS — M47817 Spondylosis without myelopathy or radiculopathy, lumbosacral region: Secondary | ICD-10-CM | POA: Diagnosis not present

## 2015-03-27 DIAGNOSIS — M47816 Spondylosis without myelopathy or radiculopathy, lumbar region: Secondary | ICD-10-CM | POA: Diagnosis not present

## 2015-03-27 DIAGNOSIS — M5135 Other intervertebral disc degeneration, thoracolumbar region: Secondary | ICD-10-CM | POA: Diagnosis not present

## 2015-03-27 DIAGNOSIS — R42 Dizziness and giddiness: Secondary | ICD-10-CM | POA: Diagnosis not present

## 2015-04-10 ENCOUNTER — Encounter: Payer: Self-pay | Admitting: Internal Medicine

## 2015-04-10 ENCOUNTER — Ambulatory Visit (INDEPENDENT_AMBULATORY_CARE_PROVIDER_SITE_OTHER): Payer: PPO | Admitting: Internal Medicine

## 2015-04-10 VITALS — BP 138/84 | HR 70 | Temp 98.6°F | Resp 18 | Ht 62.5 in | Wt 164.4 lb

## 2015-04-10 DIAGNOSIS — N898 Other specified noninflammatory disorders of vagina: Secondary | ICD-10-CM | POA: Diagnosis not present

## 2015-04-10 DIAGNOSIS — R739 Hyperglycemia, unspecified: Secondary | ICD-10-CM | POA: Diagnosis not present

## 2015-04-10 DIAGNOSIS — I10 Essential (primary) hypertension: Secondary | ICD-10-CM

## 2015-04-10 DIAGNOSIS — N760 Acute vaginitis: Secondary | ICD-10-CM

## 2015-04-10 DIAGNOSIS — E7800 Pure hypercholesterolemia, unspecified: Secondary | ICD-10-CM

## 2015-04-10 DIAGNOSIS — N771 Vaginitis, vulvitis and vulvovaginitis in diseases classified elsewhere: Secondary | ICD-10-CM | POA: Diagnosis not present

## 2015-04-10 LAB — BASIC METABOLIC PANEL
BUN: 13 mg/dL (ref 6–23)
CO2: 28 mEq/L (ref 19–32)
Calcium: 9.3 mg/dL (ref 8.4–10.5)
Chloride: 97 mEq/L (ref 96–112)
Creatinine, Ser: 0.86 mg/dL (ref 0.40–1.20)
GFR: 68.07 mL/min (ref >60.00)
GLUCOSE: 96 mg/dL (ref 70–99)
POTASSIUM: 3.9 meq/L (ref 3.5–5.1)
SODIUM: 133 meq/L — AB (ref 135–145)

## 2015-04-10 MED ORDER — HYDROCHLOROTHIAZIDE 25 MG PO TABS: 25.0000 mg | ORAL_TABLET | Freq: Every day | ORAL | Status: DC

## 2015-04-10 MED ORDER — NYSTATIN 100000 UNIT/GM EX CREA: 1.0000 "application " | TOPICAL_CREAM | Freq: Two times a day (BID) | CUTANEOUS | Status: DC

## 2015-04-10 MED ORDER — LOSARTAN POTASSIUM 100 MG PO TABS: 100.0000 mg | ORAL_TABLET | Freq: Every day | ORAL | Status: DC

## 2015-04-10 NOTE — Assessment & Plan Note (Signed)
Low cholesterol diet and exercise.  Follow lipid panel.  Last checked 12/2014 - LDL 150s.  Follow.

## 2015-04-10 NOTE — Progress Notes (Signed)
Patient ID: Cathy Vazquez, female   DOB: [birth year], 75 y.o.   MRN: [id number]   Subjective:    Patient ID: Cathy Vazquez, female    DOB: 22-Aug-1940, 75 y.o.   MRN: [id number]  HPI  Patient with past history of hypercholesterolemia, GERD and hypertension.  She comes in today for a scheduled follow up.  Recently evaluated in the ER.  Elevated blood pressure.  Her blood pressure increased to 361W systolic readings.  Went to ER.  They placed her on hctz 12.'5mg'$  q day.  Blood pressures have improved.  Now systolics averaging 431V.  Feels good.  Staying active.  Working in her yard.  No chest pain or tightness.  No sob.  No headaches or dizziness.  Feels good.  She does report noticing some discharge - yellow/brown - vaginal.  No pain.  No bleeding.     Past Medical History  Diagnosis Date  . Hypertension   . Hypercholesterolemia   . GERD (gastroesophageal reflux disease)   . Glaucoma   . Anemia   . Thyroid nodule   . Diverticulosis    Past Surgical History  Procedure Laterality Date  . Abdominal hysterectomy  1987    fibroids, ovaries not removed  . Tonsillectomy     Family History  Problem Relation Age of Onset  . Diabetes Father   . Hypertension Father   . Hypercholesterolemia Father   . Heart disease Father     CABG  . Hypertension Mother   . Hypercholesterolemia Mother   . Rheumatic fever Mother   . Hypertension Brother   . Diabetes Sister   . Breast cancer Maternal Aunt   . Breast cancer      cousin  . Colon cancer Neg Hx    Social History   Social History  . Marital Status: Divorced    Spouse Name: N/A  . Number of Children: 2  . Years of Education: N/A   Social History Main Topics  . Smoking status: Never Smoker   . Smokeless tobacco: Never Used  . Alcohol Use: No  . Drug Use: No  . Sexual Activity: Not Currently   Other Topics Concern  . None   Social History Narrative    Outpatient Encounter Prescriptions as of 04/10/2015  Medication Sig  .  aspirin 81 MG tablet Take 81 mg by mouth daily.  . dorzolamide-timolol (COSOPT) 22.3-6.8 MG/ML ophthalmic solution 1 drop 2 (two) times daily.  . fish oil-omega-3 fatty acids 1000 MG capsule Take 1 g by mouth 3 (three) times daily.  Marland Kitchen losartan (COZAAR) 100 MG tablet Take 1 tablet (100 mg total) by mouth daily.  . Multiple Vitamin (MULTIVITAMIN) tablet Take 1 tablet by mouth daily.  . niacin 500 MG tablet Three capsules bid  . [DISCONTINUED] losartan (COZAAR) 100 MG tablet TAKE 1 TABLET BY MOUTH EVERY DAY  . hydrochlorothiazide (HYDRODIURIL) 25 MG tablet Take 1 tablet (25 mg total) by mouth daily.  Marland Kitchen nystatin cream (MYCOSTATIN) Apply 1 application topically 2 (two) times daily.   No facility-administered encounter medications on file as of 04/10/2015.    Review of Systems  Constitutional: Negative for appetite change and unexpected weight change.  HENT: Negative for congestion and sinus pressure.   Respiratory: Negative for cough, chest tightness and shortness of breath.   Cardiovascular: Negative for chest pain, palpitations and leg swelling.  Gastrointestinal: Negative for nausea, vomiting, abdominal pain and diarrhea.  Genitourinary: Negative for dysuria and difficulty urinating.  Musculoskeletal: Positive for back  pain (better with exercise.  xray with arthritis.  ).  Skin: Negative for color change and rash.  Neurological: Negative for dizziness, light-headedness and headaches.  Psychiatric/Behavioral: Negative for dysphoric mood and agitation.       Objective:     Blood pressure rechecked by me:  158-162/84 Rechecked by me prior to leaving :  144/84  Physical Exam  Constitutional: She appears well-developed and well-nourished. No distress.  HENT:  Nose: Nose normal.  Mouth/Throat: Oropharynx is clear and moist.  Eyes: Conjunctivae are normal. Right eye exhibits no discharge. Left eye exhibits no discharge.  Neck: Neck supple. No thyromegaly present.  Cardiovascular: Normal  rate and regular rhythm.   Pulmonary/Chest: Breath sounds normal. No respiratory distress. She has no wheezes.  Abdominal: Soft. Bowel sounds are normal. There is no tenderness.  Genitourinary:  Normal external genitalia.  Vaginal vault without lesions.  Discharge present.  Could not appreciate any adnexal masses or tenderness.    Musculoskeletal: She exhibits no edema or tenderness.  Lymphadenopathy:    She has no cervical adenopathy.  Skin: No rash noted. No erythema.  Psychiatric: She has a normal mood and affect. Her behavior is normal.    BP 138/84 mmHg  Pulse 70  Temp(Src) 98.6 F (37 C) (Oral)  Resp 18  Ht 5' 2.5" (1.588 m)  Wt 164 lb 6 oz (74.56 kg)  BMI 29.57 kg/m2  SpO2 97%  LMP 02/21/1984 Wt Readings from Last 3 Encounters:  04/10/15 164 lb 6 oz (74.56 kg)  02/26/15 165 lb (74.844 kg)  12/12/14 164 lb 6 oz (74.56 kg)     Lab Results  Component Value Date   WBC 9.8 04/10/2014   HGB 14.2 04/10/2014   HCT 42.1 04/10/2014   PLT 259.0 04/10/2014   GLUCOSE 96 04/10/2015   CHOL 223* 12/26/2014   TRIG 131.0 12/26/2014   HDL 43.40 12/26/2014   LDLDIRECT 163.4 10/25/2012   LDLCALC 153* 12/26/2014   ALT 17 12/26/2014   AST 20 12/26/2014   NA 133* 04/10/2015   K 3.9 04/10/2015   CL 97 04/10/2015   CREATININE 0.86 04/10/2015   BUN 13 04/10/2015   CO2 28 04/10/2015   TSH 3.50 04/10/2014   HGBA1C 5.5 12/26/2014       Assessment & Plan:   Problem List Items Addressed This Visit    Hypercholesterolemia    Low cholesterol diet and exercise.  Follow lipid panel.  Last checked 12/2014 - LDL 150s.  Follow.        Relevant Medications   losartan (COZAAR) 100 MG tablet   hydrochlorothiazide (HYDRODIURIL) 25 MG tablet   Hyperglycemia    Low carb diet and exercise.  Follow met b and a1c.        Hypertension - Primary    Blood pressure better but still elevated.  Increase hctz to '25mg'$  q day.  Check metabolic panel today.  Follow pressures.  Get her back in soon  to reassess.         Relevant Medications   losartan (COZAAR) 100 MG tablet   hydrochlorothiazide (HYDRODIURIL) 25 MG tablet   Other Relevant Orders   Basic metabolic panel (Completed)   Vaginitis    Discharge present.  Sent off wet prep.  Some irritation - external vagina.  Apply nystatin cream bid.  Follow.  Await wet prep results.         Other Visit Diagnoses    Vaginal discharge        Relevant Orders  WET PREP FOR TRICH, YEAST, CLUE        Einar Pheasant, MD

## 2015-04-10 NOTE — Assessment & Plan Note (Addendum)
Blood pressure better but still elevated.  Increase hctz to 25mg  q day.  Check metabolic panel today.  Follow pressures.  Get her back in soon to reassess.

## 2015-04-10 NOTE — Progress Notes (Signed)
Pre-visit discussion using our clinic review tool. No additional management support is needed unless otherwise documented below in the visit note.  

## 2015-04-12 ENCOUNTER — Encounter: Payer: Self-pay | Admitting: Internal Medicine

## 2015-04-12 ENCOUNTER — Other Ambulatory Visit: Payer: Self-pay | Admitting: Internal Medicine

## 2015-04-12 DIAGNOSIS — R739 Hyperglycemia, unspecified: Secondary | ICD-10-CM

## 2015-04-12 DIAGNOSIS — N760 Acute vaginitis: Secondary | ICD-10-CM | POA: Insufficient documentation

## 2015-04-12 DIAGNOSIS — E7800 Pure hypercholesterolemia, unspecified: Secondary | ICD-10-CM

## 2015-04-12 DIAGNOSIS — E042 Nontoxic multinodular goiter: Secondary | ICD-10-CM

## 2015-04-12 DIAGNOSIS — I10 Essential (primary) hypertension: Secondary | ICD-10-CM

## 2015-04-12 NOTE — Assessment & Plan Note (Signed)
Low carb diet and exercise.  Follow met b and a1c.   

## 2015-04-12 NOTE — Assessment & Plan Note (Signed)
Discharge present.  Sent off wet prep.  Some irritation - external vagina.  Apply nystatin cream bid.  Follow.  Await wet prep results.

## 2015-04-12 NOTE — Progress Notes (Signed)
Orders placed for f/u labs.  

## 2015-04-13 ENCOUNTER — Encounter: Payer: Self-pay | Admitting: *Deleted

## 2015-04-14 ENCOUNTER — Telehealth: Payer: Self-pay

## 2015-04-14 ENCOUNTER — Other Ambulatory Visit: Payer: Self-pay | Admitting: *Deleted

## 2015-04-14 ENCOUNTER — Encounter: Payer: Self-pay | Admitting: *Deleted

## 2015-04-14 LAB — WET PREP BY MOLECULAR PROBE
CANDIDA SPECIES: NEGATIVE
GARDNERELLA VAGINALIS: POSITIVE — AB
TRICHOMONAS VAG: NEGATIVE

## 2015-04-14 MED ORDER — METRONIDAZOLE 0.75 % VA GEL: 1.0000 | Freq: Two times a day (BID) | VAGINAL | Status: DC

## 2015-04-14 NOTE — Telephone Encounter (Signed)
Pt called for test results and states that she is not allergic to any antibiotics

## 2015-04-16 ENCOUNTER — Ambulatory Visit: Payer: PPO | Admitting: Internal Medicine

## 2015-04-29 ENCOUNTER — Other Ambulatory Visit (INDEPENDENT_AMBULATORY_CARE_PROVIDER_SITE_OTHER): Payer: PPO

## 2015-04-29 DIAGNOSIS — E042 Nontoxic multinodular goiter: Secondary | ICD-10-CM | POA: Diagnosis not present

## 2015-04-29 DIAGNOSIS — E7800 Pure hypercholesterolemia, unspecified: Secondary | ICD-10-CM | POA: Diagnosis not present

## 2015-04-29 DIAGNOSIS — I10 Essential (primary) hypertension: Secondary | ICD-10-CM | POA: Diagnosis not present

## 2015-04-29 DIAGNOSIS — R739 Hyperglycemia, unspecified: Secondary | ICD-10-CM

## 2015-04-29 LAB — HEPATIC FUNCTION PANEL
ALT: 15 U/L (ref 0–35)
AST: 15 U/L (ref 0–37)
Albumin: 4.4 g/dL (ref 3.5–5.2)
Alkaline Phosphatase: 71 U/L (ref 39–117)
BILIRUBIN TOTAL: 0.6 mg/dL (ref 0.2–1.2)
Bilirubin, Direct: 0.1 mg/dL (ref 0.0–0.3)
Total Protein: 7 g/dL (ref 6.0–8.3)

## 2015-04-29 LAB — CBC WITH DIFFERENTIAL/PLATELET
Basophils Absolute: 0 10*3/uL (ref 0.0–0.1)
Basophils Relative: 0.4 % (ref 0.0–3.0)
EOS ABS: 0.1 10*3/uL (ref 0.0–0.7)
Eosinophils Relative: 1.6 % (ref 0.0–5.0)
HCT: 39 % (ref 36.0–46.0)
Hemoglobin: 13.4 g/dL (ref 12.0–15.0)
Lymphocytes Relative: 24.4 % (ref 12.0–46.0)
Lymphs Abs: 2.2 10*3/uL (ref 0.7–4.0)
MCHC: 34.5 g/dL (ref 30.0–36.0)
MCV: 85.4 fl (ref 78.0–100.0)
MONO ABS: 0.4 10*3/uL (ref 0.1–1.0)
Monocytes Relative: 4.5 % (ref 3.0–12.0)
Neutro Abs: 6.3 10*3/uL (ref 1.4–7.7)
Neutrophils Relative %: 69.1 % (ref 43.0–77.0)
Platelets: 275 10*3/uL (ref 150.0–400.0)
RBC: 4.57 Mil/uL (ref 3.87–5.11)
RDW: 15 % (ref 11.5–15.5)
WBC: 9.1 10*3/uL (ref 4.0–10.5)

## 2015-04-29 LAB — BASIC METABOLIC PANEL
BUN: 16 mg/dL (ref 6–23)
CO2: 27 meq/L (ref 19–32)
CREATININE: 0.83 mg/dL (ref 0.40–1.20)
Calcium: 9.5 mg/dL (ref 8.4–10.5)
Chloride: 102 mEq/L (ref 96–112)
GFR: 70.91 mL/min (ref >60.00)
Glucose, Bld: 100 mg/dL — ABNORMAL HIGH (ref 70–99)
Potassium: 3.9 mEq/L (ref 3.5–5.1)
Sodium: 140 mEq/L (ref 135–145)

## 2015-04-29 LAB — HEMOGLOBIN A1C: Hgb A1c MFr Bld: 5.8 % (ref 4.6–6.5)

## 2015-04-29 LAB — LIPID PANEL
CHOLESTEROL: 239 mg/dL — AB (ref 0–200)
HDL: 50.1 mg/dL (ref >39.00)
LDL Cholesterol: 157 mg/dL — ABNORMAL HIGH (ref 0–99)
NonHDL: 189.06
Total CHOL/HDL Ratio: 5
Triglycerides: 158 mg/dL — ABNORMAL HIGH (ref 0.0–149.0)
VLDL: 31.6 mg/dL (ref 0.0–40.0)

## 2015-04-29 LAB — TSH: TSH: 2.38 u[IU]/mL (ref 0.35–4.50)

## 2015-05-01 ENCOUNTER — Encounter: Payer: Self-pay | Admitting: Internal Medicine

## 2015-05-01 ENCOUNTER — Ambulatory Visit (INDEPENDENT_AMBULATORY_CARE_PROVIDER_SITE_OTHER): Payer: PPO | Admitting: Internal Medicine

## 2015-05-01 VITALS — BP 150/80 | HR 82 | Temp 98.3°F | Resp 18 | Ht 62.5 in | Wt 165.2 lb

## 2015-05-01 DIAGNOSIS — R739 Hyperglycemia, unspecified: Secondary | ICD-10-CM | POA: Diagnosis not present

## 2015-05-01 DIAGNOSIS — E042 Nontoxic multinodular goiter: Secondary | ICD-10-CM | POA: Diagnosis not present

## 2015-05-01 DIAGNOSIS — M545 Low back pain, unspecified: Secondary | ICD-10-CM

## 2015-05-01 DIAGNOSIS — L989 Disorder of the skin and subcutaneous tissue, unspecified: Secondary | ICD-10-CM

## 2015-05-01 DIAGNOSIS — E7800 Pure hypercholesterolemia, unspecified: Secondary | ICD-10-CM

## 2015-05-01 DIAGNOSIS — I10 Essential (primary) hypertension: Secondary | ICD-10-CM | POA: Diagnosis not present

## 2015-05-01 NOTE — Progress Notes (Signed)
Pre-visit discussion using our clinic review tool. No additional management support is needed unless otherwise documented below in the visit note.  

## 2015-05-01 NOTE — Progress Notes (Signed)
Patient ID: Cathy Vazquez, female   DOB: 07-10-1940, 75 y.o.   MRN: [id number]   Subjective:    Patient ID: Cathy Vazquez, female    DOB: 1940-11-06, 75 y.o.   MRN: [id number]  HPI  Patient with past history of hypercholesterolemia, GERD and hypertension.  She comes in today for a scheduled follow up.  States she feels good.  Energy better.  Taking her medication regularly.  On HCTZ.  Doing well with this.  States blood pressure averaging 938H systolic.  Feels better at this range.  No chest pain.  No sob.  No abdominal pain or cramping.  Bowels stable.  Some discomfort in her low back.  Worse in am.  Better when moves.  Has mole right cheek.     Past Medical History  Diagnosis Date  . Hypertension   . Hypercholesterolemia   . GERD (gastroesophageal reflux disease)   . Glaucoma   . Anemia   . Thyroid nodule   . Diverticulosis    Past Surgical History  Procedure Laterality Date  . Abdominal hysterectomy  1987    fibroids, ovaries not removed  . Tonsillectomy     Family History  Problem Relation Age of Onset  . Diabetes Father   . Hypertension Father   . Hypercholesterolemia Father   . Heart disease Father     CABG  . Hypertension Mother   . Hypercholesterolemia Mother   . Rheumatic fever Mother   . Hypertension Brother   . Diabetes Sister   . Breast cancer Maternal Aunt   . Breast cancer      cousin  . Colon cancer Neg Hx    Social History   Social History  . Marital Status: Divorced    Spouse Name: N/A  . Number of Children: 2  . Years of Education: N/A   Social History Main Topics  . Smoking status: Never Smoker   . Smokeless tobacco: Never Used  . Alcohol Use: No  . Drug Use: No  . Sexual Activity: Not Currently   Other Topics Concern  . None   Social History Narrative    Outpatient Encounter Prescriptions as of 05/01/2015  Medication Sig  . aspirin 81 MG tablet Take 81 mg by mouth daily.  . dorzolamide-timolol (COSOPT) 22.3-6.8 MG/ML ophthalmic  solution 1 drop 2 (two) times daily.  . fish oil-omega-3 fatty acids 1000 MG capsule Take 1 g by mouth 3 (three) times daily.  . hydrochlorothiazide (HYDRODIURIL) 25 MG tablet Take 1 tablet (25 mg total) by mouth daily.  Marland Kitchen losartan (COZAAR) 100 MG tablet Take 1 tablet (100 mg total) by mouth daily.  . metroNIDAZOLE (METROGEL) 0.75 % vaginal gel Place 1 Applicatorful vaginally 2 (two) times daily. For 5 days  . Multiple Vitamin (MULTIVITAMIN) tablet Take 1 tablet by mouth daily.  . niacin 500 MG tablet Three capsules bid  . nystatin cream (MYCOSTATIN) Apply 1 application topically 2 (two) times daily.   No facility-administered encounter medications on file as of 05/01/2015.    Review of Systems  Constitutional: Negative for appetite change and unexpected weight change.  HENT: Negative for congestion and sinus pressure.   Respiratory: Negative for cough, chest tightness and shortness of breath.   Cardiovascular: Negative for chest pain, palpitations and leg swelling.  Gastrointestinal: Negative for nausea, vomiting, abdominal pain and diarrhea.  Genitourinary: Negative for dysuria and difficulty urinating.  Musculoskeletal: Positive for back pain (low back pain.  occasionally radiates into her hips.  ). Negative  for joint swelling.  Skin: Negative for color change and rash.  Neurological: Negative for dizziness, light-headedness and headaches.  Psychiatric/Behavioral: Negative for dysphoric mood and agitation.       Objective:     Blood pressure rechecked by me:  148/78  Physical Exam  Constitutional: She appears well-developed and well-nourished. No distress.  HENT:  Nose: Nose normal.  Mouth/Throat: Oropharynx is clear and moist.  Eyes: Conjunctivae are normal. Right eye exhibits no discharge. Left eye exhibits no discharge.  Neck: Neck supple. No thyromegaly present.  Cardiovascular: Normal rate and regular rhythm.   Pulmonary/Chest: Breath sounds normal. No respiratory  distress. She has no wheezes.  Abdominal: Soft. Bowel sounds are normal. There is no tenderness.  Musculoskeletal: She exhibits no edema or tenderness.  Lymphadenopathy:    She has no cervical adenopathy.  Skin: No rash noted. No erythema.  Mole right check.    Psychiatric: She has a normal mood and affect. Her behavior is normal.    BP 150/80 mmHg  Pulse 82  Temp(Src) 98.3 F (36.8 C) (Oral)  Resp 18  Ht 5' 2.5" (1.588 m)  Wt 165 lb 4 oz (74.957 kg)  BMI 29.72 kg/m2  SpO2 94%  LMP 02/21/1984 Wt Readings from Last 3 Encounters:  05/01/15 165 lb 4 oz (74.957 kg)  04/10/15 164 lb 6 oz (74.56 kg)  02/26/15 165 lb (74.844 kg)     Lab Results  Component Value Date   WBC 9.1 04/29/2015   HGB 13.4 04/29/2015   HCT 39.0 04/29/2015   PLT 275.0 04/29/2015   GLUCOSE 100* 04/29/2015   CHOL 239* 04/29/2015   TRIG 158.0* 04/29/2015   HDL 50.10 04/29/2015   LDLDIRECT 163.4 10/25/2012   LDLCALC 157* 04/29/2015   ALT 15 04/29/2015   AST 15 04/29/2015   NA 140 04/29/2015   K 3.9 04/29/2015   CL 102 04/29/2015   CREATININE 0.83 04/29/2015   BUN 16 04/29/2015   CO2 27 04/29/2015   TSH 2.38 04/29/2015   HGBA1C 5.8 04/29/2015    Mm Screening Breast Tomo Bilateral  09/09/2014  CLINICAL DATA:  Screening. EXAM: DIGITAL SCREENING BILATERAL MAMMOGRAM WITH 3D TOMO WITH CAD COMPARISON:  Previous exam(s). ACR Breast Density Category d: The breast tissue is extremely dense, which lowers the sensitivity of mammography. FINDINGS: There are no findings suspicious for malignancy. Images were processed with CAD. IMPRESSION: No mammographic evidence of malignancy. A result letter of this screening mammogram will be mailed directly to the patient. RECOMMENDATION: Screening mammogram in one year. (Code:SM-B-01Y) BI-RADS CATEGORY  1: Negative. Electronically Signed   By: Pamelia Hoit M.D.   On: 09/09/2014 16:48       Assessment & Plan:   Problem List Items Addressed This Visit     Hypercholesterolemia    Low cholesterol diet and exercise.  Discussed recent labs.  LDL 157.  Discussed starting cholesterol medication.  She declines.  Follow lipid panel.        Relevant Orders   Lipid panel   Hyperglycemia    Low carb diet and exercise.  Follow met b and a1c.  Recent a1c 5.8.       Relevant Orders   Hemoglobin A1c   Hypertension - Primary    Blood pressure as outlined.  Stable.  Continue current medication regimen.  Follow pressures.  Follow metabolic panel.       Relevant Orders   Basic metabolic panel   Hepatic function panel   Lesion of skin of cheek  Persistent lesion.  Refer to dermatology.        Relevant Orders   Ambulatory referral to Dermatology   Low back pain    Better with movement and exercise.  Declines further evaluation.  Follow.        Multiple thyroid nodules    Saw Dr Eddie Dibbles.  Has been released.  Follow thyroid function tests.            Einar Pheasant, MD

## 2015-05-03 ENCOUNTER — Encounter: Payer: Self-pay | Admitting: Internal Medicine

## 2015-05-03 DIAGNOSIS — M545 Low back pain, unspecified: Secondary | ICD-10-CM | POA: Insufficient documentation

## 2015-05-03 NOTE — Assessment & Plan Note (Signed)
Blood pressure as outlined.  Stable.  Continue current medication regimen.  Follow pressures.  Follow metabolic panel.

## 2015-05-03 NOTE — Assessment & Plan Note (Signed)
Persistent lesion.  Refer to dermatology.  

## 2015-05-03 NOTE — Assessment & Plan Note (Signed)
Low cholesterol diet and exercise.  Discussed recent labs.  LDL 157.  Discussed starting cholesterol medication.  She declines.  Follow lipid panel.

## 2015-05-03 NOTE — Assessment & Plan Note (Signed)
Better with movement and exercise.  Declines further evaluation.  Follow.

## 2015-05-03 NOTE — Assessment & Plan Note (Addendum)
Low carb diet and exercise.  Follow met b and a1c.  Recent a1c 5.8.

## 2015-05-03 NOTE — Assessment & Plan Note (Signed)
Saw Dr Eddie Dibbles.  Has been released.  Follow thyroid function tests.

## 2015-06-14 ENCOUNTER — Other Ambulatory Visit: Payer: Self-pay | Admitting: Internal Medicine

## 2015-07-13 DIAGNOSIS — H401131 Primary open-angle glaucoma, bilateral, mild stage: Secondary | ICD-10-CM | POA: Diagnosis not present

## 2015-08-05 DIAGNOSIS — H40053 Ocular hypertension, bilateral: Secondary | ICD-10-CM | POA: Diagnosis not present

## 2015-08-13 ENCOUNTER — Other Ambulatory Visit (INDEPENDENT_AMBULATORY_CARE_PROVIDER_SITE_OTHER): Payer: PPO

## 2015-08-13 DIAGNOSIS — I10 Essential (primary) hypertension: Secondary | ICD-10-CM

## 2015-08-13 DIAGNOSIS — E7800 Pure hypercholesterolemia, unspecified: Secondary | ICD-10-CM

## 2015-08-13 DIAGNOSIS — R739 Hyperglycemia, unspecified: Secondary | ICD-10-CM

## 2015-08-13 LAB — LIPID PANEL
CHOL/HDL RATIO: 5
CHOLESTEROL: 242 mg/dL — AB (ref 0–200)
HDL: 46.6 mg/dL (ref >39.00)
LDL CALC: 165 mg/dL — AB (ref 0–99)
NonHDL: 195.75
Triglycerides: 153 mg/dL — ABNORMAL HIGH (ref 0.0–149.0)
VLDL: 30.6 mg/dL (ref 0.0–40.0)

## 2015-08-13 LAB — BASIC METABOLIC PANEL
BUN: 14 mg/dL (ref 6–23)
CALCIUM: 9 mg/dL (ref 8.4–10.5)
CO2: 29 meq/L (ref 19–32)
CREATININE: 0.78 mg/dL (ref 0.40–1.20)
Chloride: 105 mEq/L (ref 96–112)
GFR: 76.12 mL/min (ref >60.00)
GLUCOSE: 108 mg/dL — AB (ref 70–99)
Potassium: 3.8 mEq/L (ref 3.5–5.1)
SODIUM: 139 meq/L (ref 135–145)

## 2015-08-13 LAB — HEPATIC FUNCTION PANEL
ALBUMIN: 4.2 g/dL (ref 3.5–5.2)
ALT: 18 U/L (ref 0–35)
AST: 16 U/L (ref 0–37)
Alkaline Phosphatase: 66 U/L (ref 39–117)
Bilirubin, Direct: 0.1 mg/dL (ref 0.0–0.3)
TOTAL PROTEIN: 7.1 g/dL (ref 6.0–8.3)
Total Bilirubin: 0.6 mg/dL (ref 0.2–1.2)

## 2015-08-13 LAB — HEMOGLOBIN A1C: HEMOGLOBIN A1C: 5.6 % (ref 4.6–6.5)

## 2015-08-14 ENCOUNTER — Encounter: Payer: Self-pay | Admitting: *Deleted

## 2015-08-21 ENCOUNTER — Encounter: Payer: Self-pay | Admitting: Internal Medicine

## 2015-08-21 ENCOUNTER — Ambulatory Visit (INDEPENDENT_AMBULATORY_CARE_PROVIDER_SITE_OTHER): Payer: PPO | Admitting: Internal Medicine

## 2015-08-21 VITALS — BP 170/120 | HR 92 | Temp 98.2°F | Resp 18 | Ht 62.0 in | Wt 164.0 lb

## 2015-08-21 DIAGNOSIS — E7800 Pure hypercholesterolemia, unspecified: Secondary | ICD-10-CM

## 2015-08-21 DIAGNOSIS — I10 Essential (primary) hypertension: Secondary | ICD-10-CM

## 2015-08-21 DIAGNOSIS — Z0000 Encounter for general adult medical examination without abnormal findings: Secondary | ICD-10-CM | POA: Diagnosis not present

## 2015-08-21 DIAGNOSIS — Z1239 Encounter for other screening for malignant neoplasm of breast: Secondary | ICD-10-CM

## 2015-08-21 DIAGNOSIS — M545 Low back pain, unspecified: Secondary | ICD-10-CM

## 2015-08-21 DIAGNOSIS — E042 Nontoxic multinodular goiter: Secondary | ICD-10-CM

## 2015-08-21 DIAGNOSIS — R739 Hyperglycemia, unspecified: Secondary | ICD-10-CM

## 2015-08-21 MED ORDER — SPIRONOLACTONE 25 MG PO TABS: 25.0000 mg | ORAL_TABLET | Freq: Every day | ORAL | Status: DC

## 2015-08-21 NOTE — Progress Notes (Signed)
Pre-visit discussion using our clinic review tool. No additional management support is needed unless otherwise documented below in the visit note.  

## 2015-08-21 NOTE — Progress Notes (Signed)
Patient ID: Cathy Vazquez, female   DOB: 06/13/1940, 75 y.o.   MRN: [id number]   Subjective:    Patient ID: Cathy Vazquez, female    DOB: 1940-09-16, 75 y.o.   MRN: [id number]  HPI  Patient with past history of hypercholesterolemia, GERD and hypertension.  She comes in today to follow up on these issues.  She overall feels good.  Trying to stay active.  No chest pain.  No sob.  No headache.  No dizziness.  No abdominal pain or cramping.  Bowels stable.  No urine change.  She does report some right low back and right hip pain.  Limits activity.  Some better when first gets up and starts moving.  Desires no further w/up.  She stopped hctz.  Only on losartan.  Outside blood pressure checks - 140s/70s.     Past Medical History  Diagnosis Date  . Hypertension   . Hypercholesterolemia   . GERD (gastroesophageal reflux disease)   . Glaucoma   . Anemia   . Thyroid nodule   . Diverticulosis    Past Surgical History  Procedure Laterality Date  . Abdominal hysterectomy  1987    fibroids, ovaries not removed  . Tonsillectomy     Family History  Problem Relation Age of Onset  . Diabetes Father   . Hypertension Father   . Hypercholesterolemia Father   . Heart disease Father     CABG  . Hypertension Mother   . Hypercholesterolemia Mother   . Rheumatic fever Mother   . Hypertension Brother   . Diabetes Sister   . Breast cancer Maternal Aunt   . Breast cancer      cousin  . Colon cancer Neg Hx    Social History   Social History  . Marital Status: Divorced    Spouse Name: N/A  . Number of Children: 2  . Years of Education: N/A   Social History Main Topics  . Smoking status: Never Smoker   . Smokeless tobacco: Never Used  . Alcohol Use: No  . Drug Use: No  . Sexual Activity: Not Currently   Other Topics Concern  . None   Social History Narrative    Outpatient Encounter Prescriptions as of 08/21/2015  Medication Sig  . aspirin 81 MG tablet Take 81 mg by mouth daily.  .  dorzolamide-timolol (COSOPT) 22.3-6.8 MG/ML ophthalmic solution 1 drop 2 (two) times daily.  . fish oil-omega-3 fatty acids 1000 MG capsule Take 1 g by mouth 3 (three) times daily.  Marland Kitchen losartan (COZAAR) 100 MG tablet Take 1 tablet (100 mg total) by mouth daily.  . Multiple Vitamin (MULTIVITAMIN) tablet Take 1 tablet by mouth daily.  . niacin 500 MG tablet Three capsules bid  . nystatin cream (MYCOSTATIN) Apply 1 application topically 2 (two) times daily.  . [DISCONTINUED] hydrochlorothiazide (HYDRODIURIL) 25 MG tablet TAKE 1 TABLET (25 MG TOTAL) BY MOUTH DAILY.  Marland Kitchen spironolactone (ALDACTONE) 25 MG tablet Take 1 tablet (25 mg total) by mouth daily.  . [DISCONTINUED] metroNIDAZOLE (METROGEL) 0.75 % vaginal gel Place 1 Applicatorful vaginally 2 (two) times daily. For 5 days   No facility-administered encounter medications on file as of 08/21/2015.    Review of Systems  Constitutional: Negative for appetite change and unexpected weight change.  HENT: Negative for congestion and sinus pressure.   Respiratory: Negative for cough, chest tightness and shortness of breath.   Cardiovascular: Negative for chest pain, palpitations and leg swelling.  Gastrointestinal: Negative for nausea, vomiting,  abdominal pain and diarrhea.  Genitourinary: Negative for dysuria and difficulty urinating.  Musculoskeletal: Positive for back pain. Negative for joint swelling.  Skin: Negative for color change and rash.  Neurological: Negative for dizziness, light-headedness and headaches.  Psychiatric/Behavioral: Negative for dysphoric mood and agitation.       Objective:     Blood pressure rechecked by me:  178/84  Physical Exam  Constitutional: She appears well-developed and well-nourished. No distress.  HENT:  Nose: Nose normal.  Mouth/Throat: Oropharynx is clear and moist.  Neck: Neck supple. No thyromegaly present.  Cardiovascular: Normal rate and regular rhythm.   Pulmonary/Chest: Breath sounds normal. No  respiratory distress. She has no wheezes.  Abdominal: Soft. Bowel sounds are normal. There is no tenderness.  Musculoskeletal: She exhibits no edema or tenderness.  Lymphadenopathy:    She has no cervical adenopathy.  Skin: No rash noted. No erythema.  Psychiatric: She has a normal mood and affect. Her behavior is normal.    BP 170/120 mmHg  Pulse 92  Temp(Src) 98.2 F (36.8 C) (Oral)  Resp 18  Ht '5\' 2"'$  (1.575 m)  Wt 164 lb (74.39 kg)  BMI 29.99 kg/m2  SpO2 95%  LMP 02/21/1984 Wt Readings from Last 3 Encounters:  08/21/15 164 lb (74.39 kg)  05/01/15 165 lb 4 oz (74.957 kg)  04/10/15 164 lb 6 oz (74.56 kg)     Lab Results  Component Value Date   WBC 9.1 04/29/2015   HGB 13.4 04/29/2015   HCT 39.0 04/29/2015   PLT 275.0 04/29/2015   GLUCOSE 108* 08/13/2015   CHOL 242* 08/13/2015   TRIG 153.0* 08/13/2015   HDL 46.60 08/13/2015   LDLDIRECT 163.4 10/25/2012   LDLCALC 165* 08/13/2015   ALT 18 08/13/2015   AST 16 08/13/2015   NA 139 08/13/2015   K 3.8 08/13/2015   CL 105 08/13/2015   CREATININE 0.78 08/13/2015   BUN 14 08/13/2015   CO2 29 08/13/2015   TSH 2.38 04/29/2015   HGBA1C 5.6 08/13/2015    Dg Bone Density  09/09/2014  EXAM: DXA BONE DENSITY STUDY The [facility] [employer] hard-copy report (which includes all data, graphical display, and FRAX results when applicable) has been sent directly to the ordering physician. This report can also be obtained electronically by viewing images for this exam through the performing facility's EMR, or by logging directly into [employer]. Electronically Signed   By: Fidela Salisbury M.D.   On: 09/09/2014 14:24   Mm Screening Breast Tomo Bilateral  09/09/2014  CLINICAL DATA:  Screening. EXAM: DIGITAL SCREENING BILATERAL MAMMOGRAM WITH 3D TOMO WITH CAD COMPARISON:  Previous exam(s). ACR Breast Density Category d: The breast tissue is extremely dense, which lowers the sensitivity of mammography. FINDINGS: There are no  findings suspicious for malignancy. Images were processed with CAD. IMPRESSION: No mammographic evidence of malignancy. A result letter of this screening mammogram will be mailed directly to the patient. RECOMMENDATION: Screening mammogram in one year. (Code:SM-B-01Y) BI-RADS CATEGORY  1: Negative. Electronically Signed   By: Pamelia Hoit M.D.   On: 09/09/2014 16:48       Assessment & Plan:   Problem List Items Addressed This Visit    Health care maintenance    Physical today 08/21/15.  Colonoscopy 10/06/06.  Barium enema - ok.  S/p hysterectomy.  Mammogram scheduled.        Hypercholesterolemia    Cholesterol elevated.  She is unable to take statin medications.  Saw Dr Arther Dames previously.  Was on  niacin.  Needs to restart.        Relevant Medications   spironolactone (ALDACTONE) 25 MG tablet   Hyperglycemia    Low carb diet and exercise.  Follow met b and a1c.       Hypertension - Primary    Blood pressure elevated.  Intolerant to '25mg'$  of hctz so she stopped the medication.  Only on losartan.  Intolerant to amlodipine.  See allergies.  Start a trial of aldactone '25mg'$  q day.  Follow pressures.  Follow metabolic panel.  Given elevation, EKG obtained and revealed SR with no acute ischemic changes.        Relevant Medications   spironolactone (ALDACTONE) 25 MG tablet   Other Relevant Orders   EKG 12-Lead (Completed)   Basic metabolic panel   Low back pain    Persistent intermittent low back pain as outlined.  She declines further w/up at this time.  Stay active.  Follow.       Multiple thyroid nodules    Saw Dr Eddie Dibbles.  Has been released.  Follow tsh.        Other Visit Diagnoses    Screening breast examination        Relevant Orders    MM DIGITAL SCREENING BILATERAL        Einar Pheasant, MD

## 2015-08-21 NOTE — Assessment & Plan Note (Signed)
Physical today 08/21/15.  Colonoscopy 10/06/06.  Barium enema - ok.  S/p hysterectomy.  Mammogram scheduled.

## 2015-08-22 ENCOUNTER — Encounter: Payer: Self-pay | Admitting: Internal Medicine

## 2015-08-22 NOTE — Assessment & Plan Note (Addendum)
Blood pressure elevated.  Intolerant to 25mg  of hctz so she stopped the medication.  Only on losartan.  Intolerant to amlodipine.  See allergies.  Start a trial of aldactone 25mg  q day.  Follow pressures.  Follow metabolic panel.  Given elevation, EKG obtained and revealed SR with no acute ischemic changes.

## 2015-08-22 NOTE — Assessment & Plan Note (Signed)
Persistent intermittent low back pain as outlined.  She declines further w/up at this time.  Stay active.  Follow.

## 2015-08-22 NOTE — Assessment & Plan Note (Signed)
Low carb diet and exercise.  Follow met b and a1c.  

## 2015-08-22 NOTE — Assessment & Plan Note (Signed)
Saw Dr Eddie Dibbles.  Has been released.  Follow tsh.

## 2015-08-22 NOTE — Assessment & Plan Note (Signed)
Cholesterol elevated.  She is unable to take statin medications.  Saw Dr Arther Dames previously.  Was on niacin.  Needs to restart.

## 2015-08-31 ENCOUNTER — Other Ambulatory Visit (INDEPENDENT_AMBULATORY_CARE_PROVIDER_SITE_OTHER): Payer: PPO

## 2015-08-31 ENCOUNTER — Telehealth: Payer: Self-pay | Admitting: *Deleted

## 2015-08-31 DIAGNOSIS — I10 Essential (primary) hypertension: Secondary | ICD-10-CM | POA: Diagnosis not present

## 2015-08-31 LAB — BASIC METABOLIC PANEL
BUN: 15 mg/dL (ref 6–23)
CHLORIDE: 103 meq/L (ref 96–112)
CO2: 26 meq/L (ref 19–32)
Calcium: 9.8 mg/dL (ref 8.4–10.5)
Creatinine, Ser: 0.86 mg/dL (ref 0.40–1.20)
GFR: 68 mL/min (ref >60.00)
GLUCOSE: 111 mg/dL — AB (ref 70–99)
POTASSIUM: 4.2 meq/L (ref 3.5–5.1)
SODIUM: 137 meq/L (ref 135–145)

## 2015-08-31 NOTE — Telephone Encounter (Signed)
Please schedule an appt to see a provider and then we can do xray, thanks arnett? On Tuesday

## 2015-08-31 NOTE — Telephone Encounter (Signed)
Please advise thanks.

## 2015-08-31 NOTE — Telephone Encounter (Signed)
Needs to be seen if injury and then can determine what xray (if any) needed.

## 2015-08-31 NOTE — Telephone Encounter (Signed)
scheduled

## 2015-08-31 NOTE — Telephone Encounter (Signed)
Patient has requested to have a XRay to be ordered on her left Knee. She stated that she leaned into a box to pick up produce, and she felt her knee pop. Pt contact [phone]

## 2015-09-01 ENCOUNTER — Ambulatory Visit (INDEPENDENT_AMBULATORY_CARE_PROVIDER_SITE_OTHER): Payer: PPO | Admitting: Family

## 2015-09-01 ENCOUNTER — Encounter: Payer: Self-pay | Admitting: *Deleted

## 2015-09-01 ENCOUNTER — Encounter: Payer: Self-pay | Admitting: Family

## 2015-09-01 VITALS — BP 180/90 | HR 63 | Temp 98.3°F | Ht 62.0 in | Wt 166.8 lb

## 2015-09-01 DIAGNOSIS — I10 Essential (primary) hypertension: Secondary | ICD-10-CM | POA: Diagnosis not present

## 2015-09-01 DIAGNOSIS — M25562 Pain in left knee: Secondary | ICD-10-CM

## 2015-09-01 MED ORDER — DICLOFENAC SODIUM 1 % TD GEL: 4.0000 g | Freq: Four times a day (QID) | TRANSDERMAL | Status: DC

## 2015-09-01 MED ORDER — SPIRONOLACTONE 50 MG PO TABS: 25.0000 mg | ORAL_TABLET | Freq: Every day | ORAL | Status: DC

## 2015-09-01 NOTE — Progress Notes (Signed)
Subjective:    Patient ID: Cathy Vazquez, female    DOB: 1940-11-07, 75 y.o.   MRN: [id number]   Cathy Vazquez is a 75 y.o. female who presents today for an acute visit.    HPI Comments: Here for evaluation of left medial  knee pain x 1 week, slight improvement. Injury occurred while reaching in a box to get a watermelon in the grocery store felt sharp pain. Describes as 5/10. Tylenol alleviates some. Improves with rest. No popping sound, catching, or giving way. Pain is worse while laying in the bed when trying to get leg comfortable. Able to walk without pain.   Patient reports blood pressure has been high and 'she is working on it with dr scott.' Denies exertional chest pain or pressure, numbness or tingling radiating to left arm or jaw, palpitations, dizziness, frequent headaches, changes in vision, or shortness of breath. At home BPs ranges from SBP 160-190. On New medication aldactone and reports BP has come down a little.     Past Medical History  Diagnosis Date  . Hypertension   . Hypercholesterolemia   . GERD (gastroesophageal reflux disease)   . Glaucoma   . Anemia   . Thyroid nodule   . Diverticulosis    Allergies: Norvasc; Contrast media; Benicar; Codeine; Felodipine; Lipitor; Stay awake; and Zocor Current Outpatient Prescriptions on File Prior to Visit  Medication Sig Dispense Refill  . aspirin 81 MG tablet Take 81 mg by mouth daily.    . dorzolamide-timolol (COSOPT) 22.3-6.8 MG/ML ophthalmic solution 1 drop 2 (two) times daily.    . fish oil-omega-3 fatty acids 1000 MG capsule Take 1 g by mouth 3 (three) times daily.    Marland Kitchen losartan (COZAAR) 100 MG tablet Take 1 tablet (100 mg total) by mouth daily. 90 tablet 3  . Multiple Vitamin (MULTIVITAMIN) tablet Take 1 tablet by mouth daily.    Marland Kitchen nystatin cream (MYCOSTATIN) Apply 1 application topically 2 (two) times daily. 30 g 0  . spironolactone (ALDACTONE) 25 MG tablet Take 1 tablet (25 mg total) by mouth daily. 30 tablet  1  . niacin 500 MG tablet Reported on 09/01/2015     No current facility-administered medications on file prior to visit.    Social History  Substance Use Topics  . Smoking status: Never Smoker   . Smokeless tobacco: Never Used  . Alcohol Use: No    Review of Systems  Constitutional: Negative for fever and chills.  Eyes: Negative for visual disturbance.  Respiratory: Negative for cough, shortness of breath and wheezing.   Cardiovascular: Negative for chest pain and palpitations.  Gastrointestinal: Negative for nausea and vomiting.  Musculoskeletal: Negative for joint swelling.  Neurological: Negative for headaches.      Objective:    BP 196/64 mmHg  Pulse 63  Temp(Src) 98.3 F (36.8 C) (Oral)  Ht 5\' 2"  (1.575 m)  Wt 166 lb 12.8 oz (75.66 kg)  BMI 30.50 kg/m2  SpO2 97%  LMP 02/21/1984   Physical Exam  Constitutional: She appears well-developed and well-nourished.  HENT:  Mouth/Throat: Uvula is midline, oropharynx is clear and moist and mucous membranes are normal.  Eyes: Conjunctivae and EOM are normal. Pupils are equal, round, and reactive to light.  Fundus normal bilaterally.   Cardiovascular: Normal rate, regular rhythm, normal heart sounds and normal pulses.   Pulmonary/Chest: Effort normal and breath sounds normal. She has no wheezes. She has no rhonchi. She has no rales.  Musculoskeletal:  Right knee: She exhibits normal range of motion, no swelling and no effusion. No tenderness found.       Left knee: She exhibits normal range of motion, no swelling, no effusion and no bony tenderness. Tenderness found. Medial joint line tenderness noted.  Bilateral knees are symmetric. No effusion appreciated. No increase in warmth or erythema. Crepitus felt with flexion of bilateral knees.  Left knee:  Full ROM with extension and flexion. No catching with McMurray maneuver. No patellar apprehension. Negative anterior drawer and lachman's- no laxity appreciated.     Neurological: She is alert. She has normal strength. No cranial nerve deficit or sensory deficit. She displays a negative Romberg sign.  Reflex Scores:      Bicep reflexes are 2+ on the right side and 2+ on the left side.      Patellar reflexes are 2+ on the right side and 2+ on the left side. Grip equal and strong bilateral upper extremities. Gait strong and steady. Able to perform rapid alternating movement without difficulty.   Skin: Skin is warm and dry.  Psychiatric: She has a normal mood and affect. Her speech is normal and behavior is normal. Thought content normal.  Vitals reviewed.      Assessment & Plan:   1. Left knee pain Suspect acute on chronic meniscal etiology. Improving on it's own. We agreed on conservative therapy. - diclofenac sodium (VOLTAREN) 1 % GEL; Apply 4 g topically 4 (four) times daily.  Dispense: 1 Tube; Refill: 3  2. Essential hypertension Increase aldactone. Monitor electrolytes in one week. Consulted with PCP. Advised that I would like to have an EKG to ensure no ischemic changes; Patient politely declined EKG today as she stated she had this' the last time she was here'.  - spironolactone (ALDACTONE) 50 MG tablet; Take 0.5 tablets (25 mg total) by mouth daily.  Dispense: 30 tablet; Refill: 3 - Basic metabolic panel; Future    I am having Cathy Vazquez maintain her fish oil-omega-3 fatty acids, niacin, aspirin, multivitamin, dorzolamide-timolol, losartan, nystatin cream, and spironolactone.   No orders of the defined types were placed in this encounter.    Return precautions given.   Start medications as prescribed and explained to patient on After Visit Summary ( AVS). Risks, benefits, and alternatives of the medications and treatment plan prescribed today were discussed, and patient expressed understanding.   Education regarding symptom management and diagnosis given to patient.   Follow-up:Plan follow-up and return precautions given if any  worsening symptoms or change in condition.   Continue to follow with Cathy Pheasant, MD for routine health maintenance.   Cathy Vazquez and I agreed with plan.   Mable Paris, FNP

## 2015-09-01 NOTE — Patient Instructions (Addendum)
Very concerned about your blood pressure. Please keep log and note change to aldactone.   Please come back next week to have lab work and keep log of BPs at home.   Let's treat the knee conservatively as we discussed.   Over-the-counter medications you may try for arthritic pain include:   ThermaCare patches   Capsaicin cream   Icy hot  Home exercises as we discussed below/handout.  If conservative treatment doesn't yield results, we will consider physical therapy, consult to Sports Medicine/Orthopedics for further evaluation, and imaging.   If there is no improvement in your symptoms, or if there is any worsening of symptoms, or if you have any additional concerns, please return for re-evaluation; or, if we are closed, consider going to the Emergency Room for evaluation if symptoms urgent.

## 2015-09-07 ENCOUNTER — Telehealth: Payer: Self-pay | Admitting: Family

## 2015-09-07 ENCOUNTER — Other Ambulatory Visit (INDEPENDENT_AMBULATORY_CARE_PROVIDER_SITE_OTHER): Payer: PPO

## 2015-09-07 DIAGNOSIS — I10 Essential (primary) hypertension: Secondary | ICD-10-CM | POA: Diagnosis not present

## 2015-09-07 DIAGNOSIS — E871 Hypo-osmolality and hyponatremia: Secondary | ICD-10-CM

## 2015-09-07 LAB — BASIC METABOLIC PANEL
BUN: 15 mg/dL (ref 6–23)
CALCIUM: 9.6 mg/dL (ref 8.4–10.5)
CO2: 27 mEq/L (ref 19–32)
Chloride: 98 mEq/L (ref 96–112)
Creatinine, Ser: 0.9 mg/dL (ref 0.40–1.20)
GFR: 64.52 mL/min (ref >60.00)
GLUCOSE: 113 mg/dL — AB (ref 70–99)
POTASSIUM: 4.6 meq/L (ref 3.5–5.1)
SODIUM: 131 meq/L — AB (ref 135–145)

## 2015-09-07 NOTE — Telephone Encounter (Signed)
Lab order to recheck Na.

## 2015-09-09 ENCOUNTER — Telehealth: Payer: Self-pay | Admitting: *Deleted

## 2015-09-09 DIAGNOSIS — E871 Hypo-osmolality and hyponatremia: Secondary | ICD-10-CM

## 2015-09-09 DIAGNOSIS — M25562 Pain in left knee: Secondary | ICD-10-CM

## 2015-09-09 NOTE — Telephone Encounter (Signed)
Please advise, further suggestions, I could try to schedule with another provider, thanks

## 2015-09-09 NOTE — Telephone Encounter (Signed)
Patient was seen in the office on 09/01/15 for knee injury. She was advised to ice the knee and exercise. She currently has some aching in the knee, and possible swelling.She would like further advise on what she could do next .  Pt contact [phone]

## 2015-09-10 ENCOUNTER — Ambulatory Visit: Payer: PPO

## 2015-09-10 NOTE — Telephone Encounter (Signed)
Noted and notified patient, thanks

## 2015-09-10 NOTE — Telephone Encounter (Signed)
Spoke with patient, left knee is aching.  She isn't sure about the swelling, if there is any it is a minimal amount.  There is a walnut sized area that is bothersome.  She agreed to the ortho referral to look at it further. thanks

## 2015-09-10 NOTE — Telephone Encounter (Signed)
If persistent problems with her knee, can refer to ortho for further evaluation.  If any concern about unilateral swelling, can get appt earlier here or acute care and confirm no concern regarding clot.  Can see if this is warranted.  Let me know if need to place order for ortho referral.

## 2015-09-10 NOTE — Telephone Encounter (Signed)
I have placed the order for the ortho referral.  Someone will be calling her with an appt date and time.  If any increased concerns, change in symptoms or problems, let us know.

## 2015-09-11 ENCOUNTER — Other Ambulatory Visit: Payer: PPO

## 2015-09-14 ENCOUNTER — Other Ambulatory Visit (INDEPENDENT_AMBULATORY_CARE_PROVIDER_SITE_OTHER): Payer: PPO

## 2015-09-14 DIAGNOSIS — E871 Hypo-osmolality and hyponatremia: Secondary | ICD-10-CM

## 2015-09-15 LAB — BASIC METABOLIC PANEL
BUN: 22 mg/dL (ref 6–23)
CALCIUM: 9.9 mg/dL (ref 8.4–10.5)
CO2: 23 mEq/L (ref 19–32)
Chloride: 101 mEq/L (ref 96–112)
Creatinine, Ser: 0.87 mg/dL (ref 0.40–1.20)
GFR: 67.09 mL/min (ref >60.00)
GLUCOSE: 125 mg/dL — AB (ref 70–99)
Potassium: 4.8 mEq/L (ref 3.5–5.1)
Sodium: 132 mEq/L — ABNORMAL LOW (ref 135–145)

## 2015-09-15 NOTE — Telephone Encounter (Signed)
Please call and notify patient that her sodium continues to be low. This may be result of spirolactone increase as it was normal 6/22.    We also want a close follow-up because her blood pressure is poorly controlled at this time. Next f/u 8/8.

## 2015-09-15 NOTE — Telephone Encounter (Signed)
Spoke to pt. She took the news well. Made her a lab appt. 09-22-15. She said her BP has been running 150s/80s

## 2015-09-15 NOTE — Telephone Encounter (Signed)
Please see last two documentation notes.   Please come back next week to recheck sodium- still now but improved. Pended in system.   Also, ask how BPs are running?

## 2015-09-21 DIAGNOSIS — M1712 Unilateral primary osteoarthritis, left knee: Secondary | ICD-10-CM | POA: Diagnosis not present

## 2015-09-22 ENCOUNTER — Other Ambulatory Visit (INDEPENDENT_AMBULATORY_CARE_PROVIDER_SITE_OTHER): Payer: PPO

## 2015-09-22 DIAGNOSIS — E871 Hypo-osmolality and hyponatremia: Secondary | ICD-10-CM

## 2015-09-22 LAB — BASIC METABOLIC PANEL
BUN: 22 mg/dL (ref 6–23)
CHLORIDE: 99 meq/L (ref 96–112)
CO2: 25 mEq/L (ref 19–32)
CREATININE: 0.93 mg/dL (ref 0.40–1.20)
Calcium: 10.2 mg/dL (ref 8.4–10.5)
GFR: 62.12 mL/min (ref >60.00)
Glucose, Bld: 96 mg/dL (ref 70–99)
Potassium: 4.3 mEq/L (ref 3.5–5.1)
Sodium: 133 mEq/L — ABNORMAL LOW (ref 135–145)

## 2015-09-29 ENCOUNTER — Other Ambulatory Visit: Payer: Self-pay | Admitting: Internal Medicine

## 2015-09-29 ENCOUNTER — Ambulatory Visit
Admission: RE | Admit: 2015-09-29 | Discharge: 2015-09-29 | Disposition: A | Discharge status: Home / Self Care | Payer: PPO | Source: Ambulatory Visit | Attending: Internal Medicine | Admitting: Internal Medicine

## 2015-09-29 DIAGNOSIS — Z1231 Encounter for screening mammogram for malignant neoplasm of breast: Secondary | ICD-10-CM

## 2015-09-29 DIAGNOSIS — Z1239 Encounter for other screening for malignant neoplasm of breast: Secondary | ICD-10-CM

## 2015-09-30 ENCOUNTER — Ambulatory Visit: Payer: PPO | Admitting: Internal Medicine

## 2015-10-01 ENCOUNTER — Encounter: Payer: Self-pay | Admitting: Internal Medicine

## 2015-10-01 ENCOUNTER — Ambulatory Visit (INDEPENDENT_AMBULATORY_CARE_PROVIDER_SITE_OTHER): Payer: PPO | Admitting: Internal Medicine

## 2015-10-01 DIAGNOSIS — I10 Essential (primary) hypertension: Secondary | ICD-10-CM

## 2015-10-01 DIAGNOSIS — R739 Hyperglycemia, unspecified: Secondary | ICD-10-CM

## 2015-10-01 MED ORDER — LOSARTAN POTASSIUM 100 MG PO TABS: 100.0000 mg | ORAL_TABLET | Freq: Every day | ORAL | 3 refills | Status: DC

## 2015-10-01 MED ORDER — SPIRONOLACTONE 50 MG PO TABS: 50.0000 mg | ORAL_TABLET | Freq: Every day | ORAL | 2 refills | Status: DC

## 2015-10-01 NOTE — Progress Notes (Signed)
Pre-visit discussion using our clinic review tool. No additional management support is needed unless otherwise documented below in the visit note.  

## 2015-10-01 NOTE — Progress Notes (Signed)
Patient ID: Mhia Mathre, female   DOB: 10/31/1940, 75 y.o.   MRN: [id number]   Subjective:    Patient ID: [name], female    DOB: [birth year], 75 y.o.   MRN: [id number]  HPI  Patient here for a scheduled follow up.  She had been having problems with elevated blood pressure.  Started on spironolactone.  Tolerating.  Doing well.  Feels good on the medication.  Blood pressure is better.  Most outside readings averaging 130-140s/70s.  No chest pain.  No sob.  Eating and drinking well.  No nausea or vomiting.  Bowels stable.     Past Medical History:  Diagnosis Date  . Anemia   . Diverticulosis   . GERD (gastroesophageal reflux disease)   . Glaucoma   . Hypercholesterolemia   . Hypertension   . Thyroid nodule    Past Surgical History:  Procedure Laterality Date  . ABDOMINAL HYSTERECTOMY  1987   fibroids, ovaries not removed  . TONSILLECTOMY     Family History  Problem Relation Age of Onset  . Diabetes Father   . Hypertension Father   . Hypercholesterolemia Father   . Heart disease Father     CABG  . Hypertension Mother   . Hypercholesterolemia Mother   . Rheumatic fever Mother   . Hypertension Brother   . Diabetes Sister   . Breast cancer Maternal Aunt   . Breast cancer      cousin  . Colon cancer Neg Hx    Social History   Social History  . Marital status: Divorced    Spouse name: N/A  . Number of children: 2  . Years of education: N/A   Social History Main Topics  . Smoking status: Never Smoker  . Smokeless tobacco: Never Used  . Alcohol use No  . Drug use: No  . Sexual activity: Not Currently   Other Topics Concern  . None   Social History Narrative  . None    Outpatient Encounter Prescriptions as of 10/01/2015  Medication Sig  . aspirin 81 MG tablet Take 81 mg by mouth daily.  . dorzolamide-timolol (COSOPT) 22.3-6.8 MG/ML ophthalmic solution 1 drop 2 (two) times daily.  . fish oil-omega-3 fatty acids 1000 MG capsule Take 1 g by mouth  3 (three) times daily.  Marland Kitchen losartan (COZAAR) 100 MG tablet Take 1 tablet (100 mg total) by mouth daily.  . Multiple Vitamin (MULTIVITAMIN) tablet Take 1 tablet by mouth daily.  . niacin 500 MG tablet Reported on 09/01/2015  . nystatin cream (MYCOSTATIN) Apply 1 application topically 2 (two) times daily.  Marland Kitchen spironolactone (ALDACTONE) 50 MG tablet Take 1 tablet (50 mg total) by mouth daily.  . [DISCONTINUED] diclofenac sodium (VOLTAREN) 1 % GEL Apply 4 g topically 4 (four) times daily.  . [DISCONTINUED] losartan (COZAAR) 100 MG tablet Take 1 tablet (100 mg total) by mouth daily.  . [DISCONTINUED] spironolactone (ALDACTONE) 50 MG tablet Take 0.5 tablets (25 mg total) by mouth daily.   No facility-administered encounter medications on file as of 10/01/2015.     Review of Systems  Constitutional: Negative for appetite change and unexpected weight change.  HENT: Negative for congestion and sinus pressure.   Respiratory: Negative for cough, chest tightness and shortness of breath.   Cardiovascular: Negative for chest pain, palpitations and leg swelling.  Gastrointestinal: Negative for abdominal pain, diarrhea, nausea and vomiting.  Neurological: Negative for dizziness, light-headedness and headaches.  Psychiatric/Behavioral: Negative for agitation and dysphoric mood.  Objective:     Blood pressure rechecked by me:  138/70  Physical Exam  Constitutional: She appears well-developed and well-nourished. No distress.  HENT:  Nose: Nose normal.  Mouth/Throat: Oropharynx is clear and moist.  Neck: Neck supple. No thyromegaly present.  Cardiovascular: Normal rate and regular rhythm.   Pulmonary/Chest: Breath sounds normal. No respiratory distress. She has no wheezes.  Abdominal: Soft. Bowel sounds are normal. There is no tenderness.  Musculoskeletal: She exhibits no edema or tenderness.  Lymphadenopathy:    She has no cervical adenopathy.  Skin: No rash noted. No erythema.  Psychiatric:  She has a normal mood and affect. Her behavior is normal.    BP 138/70   Pulse 81   Temp 98.1 F (36.7 C) (Oral)   Resp 18   Ht 5\' 2"  (1.575 m)   Wt 161 lb 8 oz (73.3 kg)   LMP 02/21/1984   SpO2 96%   BMI 29.54 kg/m  Wt Readings from Last 3 Encounters:  10/01/15 161 lb 8 oz (73.3 kg)  09/01/15 166 lb 12.8 oz (75.7 kg)  08/21/15 164 lb (74.4 kg)     Lab Results  Component Value Date   WBC 9.1 04/29/2015   HGB 13.4 04/29/2015   HCT 39.0 04/29/2015   PLT 275.0 04/29/2015   GLUCOSE 96 09/22/2015   CHOL 242 (H) 08/13/2015   TRIG 153.0 (H) 08/13/2015   HDL 46.60 08/13/2015   LDLDIRECT 163.4 10/25/2012   LDLCALC 165 (H) 08/13/2015   ALT 18 08/13/2015   AST 16 08/13/2015   NA 133 (L) 09/22/2015   K 4.3 09/22/2015   CL 99 09/22/2015   CREATININE 0.93 09/22/2015   BUN 22 09/22/2015   CO2 25 09/22/2015   TSH 2.38 04/29/2015   HGBA1C 5.6 08/13/2015    Mm Screening Breast Tomo Bilateral  Result Date: 09/30/2015 CLINICAL DATA:  Screening. EXAM: 2D DIGITAL SCREENING BILATERAL MAMMOGRAM WITH CAD AND ADJUNCT TOMO COMPARISON:  Previous exam(s). ACR Breast Density Category d: The breast tissue is extremely dense, which lowers the sensitivity of mammography. FINDINGS: There are no findings suspicious for malignancy. Images were processed with CAD. IMPRESSION: No mammographic evidence of malignancy. A result letter of this screening mammogram will be mailed directly to the patient. RECOMMENDATION: Screening mammogram in one year. (Code:SM-B-01Y) BI-RADS CATEGORY  1: Negative. Electronically Signed   By: Pamelia Hoit M.D.   On: 09/30/2015 09:15       Assessment & Plan:   Problem List Items Addressed This Visit    Hyperglycemia    Continue low carb diet and exercise.  Follow.        Hypertension    Blood pressure doing better on current regimen.  Follow pressures.  Continue same medication regimen.  Follow pressures.  Recheck metabolic panel in a few weeks to confirm sodium,  potassium and renal function stable.        Relevant Medications   spironolactone (ALDACTONE) 50 MG tablet   losartan (COZAAR) 100 MG tablet    Other Visit Diagnoses   None.      Einar Pheasant, MD

## 2015-10-04 ENCOUNTER — Encounter: Payer: Self-pay | Admitting: Internal Medicine

## 2015-10-04 NOTE — Assessment & Plan Note (Signed)
Continue low carb diet and exercise.  Follow.  

## 2015-10-04 NOTE — Assessment & Plan Note (Signed)
Blood pressure doing better on current regimen.  Follow pressures.  Continue same medication regimen.  Follow pressures.  Recheck metabolic panel in a few weeks to confirm sodium, potassium and renal function stable.

## 2015-10-05 ENCOUNTER — Telehealth: Payer: Self-pay | Admitting: *Deleted

## 2015-10-05 NOTE — Telephone Encounter (Signed)
Patient questioned if she take 50 mg or 25 mg of the spirolactone Pt contact  [phone]

## 2015-10-05 NOTE — Telephone Encounter (Signed)
Pt called back returning call via note. Thank you!

## 2015-10-05 NOTE — Telephone Encounter (Signed)
Attempted to reach patient, no VM available, needs to take 50mg  per the note on 8/10.

## 2015-10-05 NOTE — Telephone Encounter (Signed)
Pt aware that she will have to pay out of pocket ($23.41) because it was originally written for 50mg  tablet (take 1/2 (25mg ) once a day & she was taking a whole tablet instead). Pt aware & will pay out of pocket this time.

## 2015-10-06 NOTE — Telephone Encounter (Signed)
Noted, thanks!

## 2015-10-29 ENCOUNTER — Other Ambulatory Visit: Payer: PPO

## 2015-10-29 ENCOUNTER — Telehealth: Payer: Self-pay | Admitting: *Deleted

## 2015-10-29 ENCOUNTER — Other Ambulatory Visit (INDEPENDENT_AMBULATORY_CARE_PROVIDER_SITE_OTHER): Payer: PPO

## 2015-10-29 ENCOUNTER — Telehealth: Payer: Self-pay | Admitting: Internal Medicine

## 2015-10-29 DIAGNOSIS — E871 Hypo-osmolality and hyponatremia: Secondary | ICD-10-CM

## 2015-10-29 DIAGNOSIS — I10 Essential (primary) hypertension: Secondary | ICD-10-CM

## 2015-10-29 LAB — BASIC METABOLIC PANEL
BUN: 20 mg/dL (ref 6–23)
CHLORIDE: 95 meq/L — AB (ref 96–112)
CO2: 25 mEq/L (ref 19–32)
CREATININE: 0.92 mg/dL (ref 0.40–1.20)
Calcium: 9.7 mg/dL (ref 8.4–10.5)
GFR: 62.88 mL/min (ref >60.00)
GLUCOSE: 99 mg/dL (ref 70–99)
POTASSIUM: 4.7 meq/L (ref 3.5–5.1)
Sodium: 126 mEq/L — ABNORMAL LOW (ref 135–145)

## 2015-10-29 NOTE — Telephone Encounter (Signed)
Pt dropped off a note that is in a envelope for Dr. Nicki Reaper. Note is located up front in Dr. Bary Leriche folder.

## 2015-10-29 NOTE — Telephone Encounter (Signed)
Lab order placed.

## 2015-10-30 ENCOUNTER — Other Ambulatory Visit (INDEPENDENT_AMBULATORY_CARE_PROVIDER_SITE_OTHER): Payer: PPO

## 2015-10-30 ENCOUNTER — Telehealth: Payer: Self-pay

## 2015-10-30 DIAGNOSIS — E871 Hypo-osmolality and hyponatremia: Secondary | ICD-10-CM | POA: Diagnosis not present

## 2015-10-30 LAB — SODIUM: SODIUM: 126 meq/L — AB (ref 135–145)

## 2015-10-30 NOTE — Telephone Encounter (Signed)
Patient states she has not felt dizzy, informed patient to decrease free fluid intake.

## 2015-10-30 NOTE — Telephone Encounter (Signed)
While pt was here for blood draw she wanted a message put to Dr. Nicki Reaper. She wanted to let her know that she is having nausea and her hands have been shaking. She said it's been going on for about 1-2 weeks.

## 2015-10-30 NOTE — Telephone Encounter (Signed)
Read her note she left and reviewed message from [facility].  She apparently had previously noticed some dizziness.  Confirm this has resolved.  Also, she reported drinking water after each time she goes to the bathroom.  Per previous lab note, she was to decrease fluid intake by 1/3.  Continue decrease of free fluid intake.  Low sodium can contribute to some of her described symptoms.  If persistent dizziness, etc needs evaluation today - acute care.

## 2015-11-26 ENCOUNTER — Telehealth: Payer: Self-pay | Admitting: Internal Medicine

## 2015-11-26 NOTE — Telephone Encounter (Signed)
Need to know which medication she took this am.  Just have her monitor her blood pressure and let us know if any decrease in pressure.  The blood pressure should be ok, just have her monitor.  Also, is she supposed to take any other blood pressure medication today.  If so, let me know which medication.

## 2015-11-26 NOTE — Telephone Encounter (Signed)
Patient notified and states she is not taking anything else today, she will monitor her bp

## 2015-11-26 NOTE — Telephone Encounter (Signed)
Pt called and stated that she believes that she took both blood pressure pills this morning. States that her bp is doing ok. Please advise, thank you!  Call pt @ [phone]

## 2015-11-26 NOTE — Telephone Encounter (Signed)
Please advise 

## 2015-12-09 DIAGNOSIS — H40053 Ocular hypertension, bilateral: Secondary | ICD-10-CM | POA: Diagnosis not present

## 2015-12-14 DIAGNOSIS — L820 Inflamed seborrheic keratosis: Secondary | ICD-10-CM | POA: Diagnosis not present

## 2015-12-14 DIAGNOSIS — L578 Other skin changes due to chronic exposure to nonionizing radiation: Secondary | ICD-10-CM | POA: Diagnosis not present

## 2015-12-14 DIAGNOSIS — L538 Other specified erythematous conditions: Secondary | ICD-10-CM | POA: Diagnosis not present

## 2015-12-14 DIAGNOSIS — D485 Neoplasm of uncertain behavior of skin: Secondary | ICD-10-CM | POA: Diagnosis not present

## 2016-01-11 ENCOUNTER — Ambulatory Visit (INDEPENDENT_AMBULATORY_CARE_PROVIDER_SITE_OTHER): Payer: PPO | Admitting: Internal Medicine

## 2016-01-11 ENCOUNTER — Encounter: Payer: Self-pay | Admitting: Internal Medicine

## 2016-01-11 VITALS — BP 130/68 | HR 88 | Temp 98.1°F | Ht 62.0 in | Wt 167.4 lb

## 2016-01-11 DIAGNOSIS — R739 Hyperglycemia, unspecified: Secondary | ICD-10-CM | POA: Diagnosis not present

## 2016-01-11 DIAGNOSIS — E7800 Pure hypercholesterolemia, unspecified: Secondary | ICD-10-CM

## 2016-01-11 DIAGNOSIS — M25569 Pain in unspecified knee: Secondary | ICD-10-CM

## 2016-01-11 DIAGNOSIS — I10 Essential (primary) hypertension: Secondary | ICD-10-CM

## 2016-01-11 DIAGNOSIS — E042 Nontoxic multinodular goiter: Secondary | ICD-10-CM | POA: Diagnosis not present

## 2016-01-11 LAB — BASIC METABOLIC PANEL
BUN: 19 mg/dL (ref 6–23)
CALCIUM: 9.8 mg/dL (ref 8.4–10.5)
CO2: 25 mEq/L (ref 19–32)
CREATININE: 1 mg/dL (ref 0.40–1.20)
Chloride: 102 mEq/L (ref 96–112)
GFR: 57.08 mL/min — AB (ref >60.00)
Glucose, Bld: 96 mg/dL (ref 70–99)
POTASSIUM: 4.9 meq/L (ref 3.5–5.1)
Sodium: 134 mEq/L — ABNORMAL LOW (ref 135–145)

## 2016-01-11 LAB — TSH: TSH: 1.68 u[IU]/mL (ref 0.35–4.50)

## 2016-01-11 NOTE — Progress Notes (Signed)
Patient ID: Cathy Vazquez, female   DOB: [birth year], 75 y.o.   MRN: [id number]   Subjective:    Patient ID: Cathy Vazquez, female    DOB: 11/07/1940, 75 y.o.   MRN: [id number]  HPI  Patient here for a scheduled follow up.  She has OA - knee.  S/p injection.  Helped.  She is exercising more.  Walking faster.  Feels better.  No chest pain.  No sob.  No acid reflux.  No abdominal pain.  Bowels stable.  Blood pressure doing better.  Blood pressure averaging 127-135/60-70s.     Past Medical History:  Diagnosis Date  . Anemia   . Diverticulosis   . GERD (gastroesophageal reflux disease)   . Glaucoma   . Hypercholesterolemia   . Hypertension   . Thyroid nodule    Past Surgical History:  Procedure Laterality Date  . ABDOMINAL HYSTERECTOMY  1987   fibroids, ovaries not removed  . TONSILLECTOMY     Family History  Problem Relation Age of Onset  . Diabetes Father   . Hypertension Father   . Hypercholesterolemia Father   . Heart disease Father     CABG  . Hypertension Mother   . Hypercholesterolemia Mother   . Rheumatic fever Mother   . Hypertension Brother   . Diabetes Sister   . Breast cancer Maternal Aunt   . Breast cancer      cousin  . Colon cancer Neg Hx    Social History   Social History  . Marital status: Divorced    Spouse name: N/A  . Number of children: 2  . Years of education: N/A   Social History Main Topics  . Smoking status: Never Smoker  . Smokeless tobacco: Never Used  . Alcohol use No  . Drug use: No  . Sexual activity: Not Currently   Other Topics Concern  . None   Social History Narrative  . None    Outpatient Encounter Prescriptions as of 01/11/2016  Medication Sig  . aspirin 81 MG tablet Take 81 mg by mouth daily.  . fish oil-omega-3 fatty acids 1000 MG capsule Take 1 g by mouth 3 (three) times daily.  Marland Kitchen losartan (COZAAR) 100 MG tablet Take 1 tablet (100 mg total) by mouth daily.  . Multiple Vitamin (MULTIVITAMIN) tablet Take 1  tablet by mouth daily.  . niacin 500 MG tablet Reported on 09/01/2015  . nystatin cream (MYCOSTATIN) Apply 1 application topically 2 (two) times daily.  Marland Kitchen spironolactone (ALDACTONE) 50 MG tablet Take 1 tablet (50 mg total) by mouth daily.  . [DISCONTINUED] dorzolamide-timolol (COSOPT) 22.3-6.8 MG/ML ophthalmic solution 1 drop 2 (two) times daily.   No facility-administered encounter medications on file as of 01/11/2016.     Review of Systems  Constitutional: Negative for appetite change and unexpected weight change.  HENT: Negative for congestion and sinus pressure.   Respiratory: Negative for cough, chest tightness and shortness of breath.   Cardiovascular: Negative for chest pain, palpitations and leg swelling.  Gastrointestinal: Negative for abdominal pain, diarrhea, nausea and vomiting.  Genitourinary: Negative for difficulty urinating and dysuria.  Musculoskeletal: Negative for back pain.       Knee is better.   Skin: Negative for color change and rash.  Neurological: Negative for dizziness, light-headedness and headaches.  Psychiatric/Behavioral: Negative for agitation and dysphoric mood.       Objective:     Blood pressure rechecked by me:  130/68  Physical Exam  Constitutional: She appears well-developed  and well-nourished. No distress.  HENT:  Nose: Nose normal.  Mouth/Throat: Oropharynx is clear and moist.  Neck: Neck supple. No thyromegaly present.  Cardiovascular: Normal rate and regular rhythm.   Pulmonary/Chest: Breath sounds normal. No respiratory distress. She has no wheezes.  Abdominal: Soft. Bowel sounds are normal. There is no tenderness.  Musculoskeletal: She exhibits no edema or tenderness.  Lymphadenopathy:    She has no cervical adenopathy.  Skin: No rash noted. No erythema.  Psychiatric: She has a normal mood and affect. Her behavior is normal.    BP 130/68   Pulse 88   Temp 98.1 F (36.7 C) (Oral)   Ht _0  (1.575 m)   Wt 167 lb 6.4 oz (75.9  kg)   LMP 02/21/1984   SpO2 98%   BMI 30.62 kg/m  Wt Readings from Last 3 Encounters:  01/11/16 167 lb 6.4 oz (75.9 kg)  10/01/15 161 lb 8 oz (73.3 kg)  09/01/15 166 lb 12.8 oz (75.7 kg)     Lab Results  Component Value Date   WBC 9.1 04/29/2015   HGB 13.4 04/29/2015   HCT 39.0 04/29/2015   PLT 275.0 04/29/2015   GLUCOSE 96 01/11/2016   CHOL 242 (H) 08/13/2015   TRIG 153.0 (H) 08/13/2015   HDL 46.60 08/13/2015   LDLDIRECT 163.4 10/25/2012   LDLCALC 165 (H) 08/13/2015   ALT 18 08/13/2015   AST 16 08/13/2015   NA 134 (L) 01/11/2016   K 4.9 01/11/2016   CL 102 01/11/2016   CREATININE 1.00 01/11/2016   BUN 19 01/11/2016   CO2 25 01/11/2016   TSH 1.68 01/11/2016   HGBA1C 5.6 08/13/2015    Mm Screening Breast Tomo Bilateral  Result Date: 09/30/2015 CLINICAL DATA:  Screening. EXAM: 2D DIGITAL SCREENING BILATERAL MAMMOGRAM WITH CAD AND ADJUNCT TOMO COMPARISON:  Previous exam(s). ACR Breast Density Category d: The breast tissue is extremely dense, which lowers the sensitivity of mammography. FINDINGS: There are no findings suspicious for malignancy. Images were processed with CAD. IMPRESSION: No mammographic evidence of malignancy. A result letter of this screening mammogram will be mailed directly to the patient. RECOMMENDATION: Screening mammogram in one year. (Code:SM-B-01Y) BI-RADS CATEGORY  1: Negative. Electronically Signed   By: Pamelia Hoit M.D.   On: 09/30/2015 09:15       Assessment & Plan:   Problem List Items Addressed This Visit    Hypercholesterolemia    Low cholesterol diet and exercise.  Follow lipid panel.        Hyperglycemia    Low carb diet and exercise.  Follow met b and a1c.       Hypertension - Primary    Blood pressure much better.  Check metabolic panel.  Continue current medication regimen.        Relevant Orders   TSH (Completed)   Basic metabolic panel (Completed)   Knee pain    Better after injection.  Follow.        Multiple thyroid  nodules    Has seen Dr Eddie Dibbles.  Check thyroid function.        Relevant Orders   Ambulatory referral to Endocrinology       Einar Pheasant, MD

## 2016-01-11 NOTE — Progress Notes (Signed)
Pre visit review using our clinic review tool, if applicable. No additional management support is needed unless otherwise documented below in the visit note. 

## 2016-01-17 ENCOUNTER — Encounter: Payer: Self-pay | Admitting: Internal Medicine

## 2016-01-17 NOTE — Assessment & Plan Note (Signed)
Low carb diet and exercise.  Follow met b and a1c.  

## 2016-01-17 NOTE — Assessment & Plan Note (Signed)
Low cholesterol diet and exercise.  Follow lipid panel.   

## 2016-01-17 NOTE — Assessment & Plan Note (Signed)
Has seen Dr Eddie Dibbles.  Check thyroid function.

## 2016-01-17 NOTE — Assessment & Plan Note (Signed)
Blood pressure much better.  Check metabolic panel.  Continue current medication regimen.

## 2016-01-17 NOTE — Assessment & Plan Note (Signed)
Better after injection.  Follow.

## 2016-02-01 ENCOUNTER — Other Ambulatory Visit: Payer: Self-pay | Admitting: Internal Medicine

## 2016-02-01 DIAGNOSIS — I10 Essential (primary) hypertension: Secondary | ICD-10-CM

## 2016-02-12 ENCOUNTER — Encounter: Payer: Self-pay | Admitting: Internal Medicine

## 2016-02-26 ENCOUNTER — Ambulatory Visit: Payer: PPO

## 2016-02-29 ENCOUNTER — Ambulatory Visit: Payer: PPO

## 2016-04-18 ENCOUNTER — Ambulatory Visit: Payer: PPO | Admitting: Internal Medicine

## 2016-04-25 ENCOUNTER — Other Ambulatory Visit: Payer: Self-pay | Admitting: Internal Medicine

## 2016-04-25 ENCOUNTER — Ambulatory Visit (INDEPENDENT_AMBULATORY_CARE_PROVIDER_SITE_OTHER): Payer: PPO

## 2016-04-25 VITALS — BP 148/70 | HR 99 | Temp 98.5°F | Resp 14 | Ht 62.0 in | Wt 166.8 lb

## 2016-04-25 DIAGNOSIS — Z0000 Encounter for general adult medical examination without abnormal findings: Secondary | ICD-10-CM

## 2016-04-25 DIAGNOSIS — I10 Essential (primary) hypertension: Secondary | ICD-10-CM

## 2016-04-25 NOTE — Patient Instructions (Addendum)
  Cathy Vazquez , Thank you for taking time to come for your Medicare Wellness Visit. I appreciate your ongoing commitment to your health goals. Please review the following plan we discussed and let me know if I can assist you in the future.   Follow up with Dr. Nicki Reaper as needed.    Have a great day!  These are the goals we discussed: Goals    . Increase physical activity          Start chair exercises during winter months, as demonstrated.  Education provided.  [place] exercise classes 3x weekly.  Walk for exercise as tolerated.    . Increase water intake          Stay hydrated and drink plenty of fluids       This is a list of the screening recommended for you and due dates:  Health Maintenance  Topic Date Due  . Tetanus Vaccine  08/15/1957  . Pneumonia vaccines (2 of 2 - PPSV23) 10/29/2013  . Flu Shot  01/10/2017*  . Mammogram  09/28/2016  . DEXA scan (bone density measurement)  Completed  *Topic was postponed. The date shown is not the original due date.

## 2016-04-25 NOTE — Progress Notes (Signed)
Subjective:   Amabel Mccaffery is a 76 y.o. female who presents for Medicare Annual (Subsequent) preventive examination.  Review of Systems:  No ROS.  Medicare Wellness Visit.  Cardiac Risk Factors include: advanced age (>73men, >46 women);hypertension;obesity (BMI >30kg/m2)     Objective:     Vitals: BP (!) 148/70 (BP Location: Left Arm, Patient Position: Sitting, Cuff Size: Normal)   Pulse 99   Temp 98.5 F (36.9 C) (Oral)   Resp 14   Ht 5\' 2"  (1.575 m)   Wt 166 lb 12.8 oz (75.7 kg)   LMP 02/21/1984   SpO2 95%   BMI 30.51 kg/m   Body mass index is 30.51 kg/m.   Tobacco History  Smoking Status  . Never Smoker  Smokeless Tobacco  . Never Used     Counseling given: Not Answered   Past Medical History:  Diagnosis Date  . Anemia   . Diverticulosis   . GERD (gastroesophageal reflux disease)   . Glaucoma   . Hypercholesterolemia   . Hypertension   . Thyroid nodule    Past Surgical History:  Procedure Laterality Date  . ABDOMINAL HYSTERECTOMY  1987   fibroids, ovaries not removed  . TONSILLECTOMY     Family History  Problem Relation Age of Onset  . Diabetes Father   . Hypertension Father   . Hypercholesterolemia Father   . Heart disease Father     CABG  . Hypertension Mother   . Hypercholesterolemia Mother   . Rheumatic fever Mother   . Hypertension Brother   . Diabetes Sister   . Breast cancer Maternal Aunt   . Breast cancer      cousin  . Colon cancer Neg Hx    History  Sexual Activity  . Sexual activity: Not Currently    Outpatient Encounter Prescriptions as of 04/25/2016  Medication Sig  . aspirin 81 MG tablet Take 81 mg by mouth daily.  . fish oil-omega-3 fatty acids 1000 MG capsule Take 1 g by mouth 3 (three) times daily.  Marland Kitchen losartan (COZAAR) 100 MG tablet Take 1 tablet (100 mg total) by mouth daily.  . Multiple Vitamin (MULTIVITAMIN) tablet Take 1 tablet by mouth daily.  . niacin 500 MG tablet Reported on 09/01/2015  . nystatin  cream (MYCOSTATIN) Apply 1 application topically 2 (two) times daily.  Marland Kitchen spironolactone (ALDACTONE) 50 MG tablet TAKE 1 TABLET (50 MG TOTAL) BY MOUTH DAILY.   No facility-administered encounter medications on file as of 04/25/2016.     Activities of Daily Living In your present state of health, do you have any difficulty performing the following activities: 04/25/2016  Hearing? N  Vision? N  Difficulty concentrating or making decisions? N  Walking or climbing stairs? Y  Dressing or bathing? N  Doing errands, shopping? N  Preparing Food and eating ? N  Using the Toilet? N  In the past six months, have you accidently leaked urine? N  Do you have problems with loss of bowel control? N  Managing your Medications? N  Managing your Finances? N  Housekeeping or managing your Housekeeping? N  Some recent data might be hidden    Patient Care Team: Einar Pheasant, MD as PCP - General (Internal Medicine)    Assessment:    This is a routine wellness examination for Idania. The goal of the wellness visit is to assist the patient how to close the gaps in care and create a preventative care plan for the patient.   Osteoporosis  risk reviewed.  Medications reviewed; taking without issues or barriers.  Safety issues reviewed; smoke detectors in the home. No firearms in the home. Wears seatbelts when driving or riding with others. Patient does wear sunscreen or protective clothing when in direct sunlight. No violence in the home.  Patient is alert, normal appearance, oriented to person/place/and time. Correctly identified the president of the [place], recall of 3/3 objects, and performing simple calculations.  Patient displays appropriate judgement and can read correct time from watch face.  No new identified risk were noted.  No failures at ADL's or IADL's.   BMI- discussed the importance of a healthy diet, water intake and exercise. Educational material provided.   Daily fluid intake: 1 cup  of caffeine, 4 cups of water  HTN- followed by PCP.  Dental- every six months.  Eye- Visual acuity not assessed per patient preference since they have regular follow up with the ophthalmologist.  Wears corrective lenses.  Sleep patterns- Sleeps 8-9 hours at night.  Wakes feeling rested.  TDAP and PNA23 vaccine deferred per patient preference.  Educational material provided.  Patient Concerns: None at this time. Follow up with PCP as needed.  Exercise Activities and Dietary recommendations Current Exercise Habits: Home exercise routine, Type of exercise: stretching;calisthenics;strength training/weights, Time (Minutes): 20, Frequency (Times/Week): 3, Weekly Exercise (Minutes/Week): 60, Intensity: Mild  Goals    . Increase physical activity          Start chair exercises during winter months, as demonstrated.  Education provided.  [place] exercise classes 3x weekly.  Walk for exercise as tolerated.    . Increase water intake          Stay hydrated and drink plenty of fluids      Fall Risk Fall Risk  04/25/2016 01/11/2016 08/21/2015 02/26/2015 08/04/2014  Falls in the past year? No No No Yes No  Number falls in past yr: - - - 1 -  Follow up - - - Education provided;Falls prevention discussed -   Depression Screen PHQ 2/9 Scores 04/25/2016 01/11/2016 08/21/2015 02/26/2015  PHQ - 2 Score 0 0 0 0     Cognitive Function MMSE - Mini Mental State Exam 04/25/2016 02/26/2015  Orientation to time 5 5  Orientation to Place 5 5  Registration 3 3  Attention/ Calculation 5 5  Recall 3 3  Language- name 2 objects 2 2  Language- repeat 1 1  Language- follow 3 step command 3 3  Language- read & follow direction 1 1  Write a sentence 1 1  Copy design 1 1  Total score 30 30        Immunization History  Administered Date(s) Administered  . Influenza Split 10/29/2012  . Pneumococcal Conjugate-13 10/29/2012  . Zoster 02/21/2009   Screening Tests Health Maintenance  Topic  Date Due  . TETANUS/TDAP  08/15/1957  . PNA vac Low Risk Adult (2 of 2 - PPSV23) 10/29/2013  . INFLUENZA VACCINE  01/10/2017 (Originally 09/22/2015)  . MAMMOGRAM  09/28/2016  . DEXA SCAN  Completed      Plan:    End of life planning; Advance aging; Advanced directives discussed. Copy of current HCPOA/Living Will on file.    Medicare Attestation I have personally reviewed: The patient's medical and social history Their use of alcohol, tobacco or illicit drugs Their current medications and supplements The patient's functional ability including ADLs,fall risks, home safety risks, cognitive, and hearing and visual impairment Diet and physical activities Evidence for depression  The patient's weight, height, BMI, and visual acuity have been recorded in the chart.  I have made referrals and provided education to the patient based on review of the above and I have provided the patient with a written personalized care plan for preventive services.    During the course of the visit the patient was educated and counseled about the following appropriate screening and preventive services:   Vaccines to include Pneumoccal, Influenza, Hepatitis B, Td, Zostavax, HCV  Colorectal cancer screening-UTD  Bone density screening-UTD  Diabetes screening-followed by PCP  Glaucoma screening-annual eye exams  Mammography-UTD  Nutrition counseling   Patient Instructions (the written plan) was given to the patient.   Varney Biles, LPN  [id number]   Reviewed above information.  Agree with plan.  Dr Nicki Reaper

## 2016-04-28 ENCOUNTER — Ambulatory Visit: Payer: PPO | Admitting: Internal Medicine

## 2016-05-23 ENCOUNTER — Ambulatory Visit: Payer: PPO

## 2016-05-30 ENCOUNTER — Ambulatory Visit (INDEPENDENT_AMBULATORY_CARE_PROVIDER_SITE_OTHER): Payer: PPO | Admitting: Internal Medicine

## 2016-05-30 ENCOUNTER — Ambulatory Visit (INDEPENDENT_AMBULATORY_CARE_PROVIDER_SITE_OTHER): Payer: PPO

## 2016-05-30 ENCOUNTER — Encounter: Payer: Self-pay | Admitting: Internal Medicine

## 2016-05-30 VITALS — BP 140/78 | HR 81 | Temp 98.6°F | Resp 12 | Ht 62.0 in | Wt 167.6 lb

## 2016-05-30 DIAGNOSIS — M5441 Lumbago with sciatica, right side: Secondary | ICD-10-CM

## 2016-05-30 DIAGNOSIS — M5442 Lumbago with sciatica, left side: Secondary | ICD-10-CM | POA: Diagnosis not present

## 2016-05-30 DIAGNOSIS — I10 Essential (primary) hypertension: Secondary | ICD-10-CM | POA: Diagnosis not present

## 2016-05-30 DIAGNOSIS — M47816 Spondylosis without myelopathy or radiculopathy, lumbar region: Secondary | ICD-10-CM | POA: Diagnosis not present

## 2016-05-30 DIAGNOSIS — R739 Hyperglycemia, unspecified: Secondary | ICD-10-CM

## 2016-05-30 DIAGNOSIS — E7800 Pure hypercholesterolemia, unspecified: Secondary | ICD-10-CM

## 2016-05-30 MED ORDER — SPIRONOLACTONE 50 MG PO TABS: 50.0000 mg | ORAL_TABLET | Freq: Every day | ORAL | 1 refills | Status: DC

## 2016-05-30 NOTE — Progress Notes (Signed)
Pre-visit discussion using our clinic review tool. No additional management support is needed unless otherwise documented below in the visit note.  

## 2016-05-30 NOTE — Progress Notes (Signed)
Patient ID: Cathy Vazquez, female   DOB: [birth year], 76 y.o.   MRN: [id number]   Subjective:    Patient ID: [name], female    DOB: [birth year], 76 y.o.   MRN: [id number]  HPI  Patient here for a scheduled follow up.  She reports she is doing well.  Feels good.  Feels better than she has in a long time.  No chest pain.  No sob.  No acid reflux.  No abdominal pain.  Bowels stable.  Blood pressure doing better averaging 130-140/60-70s.  Does report some low back pain.  Persistent.  No radicular symptoms.     Past Medical History:  Diagnosis Date  . Anemia   . Diverticulosis   . GERD (gastroesophageal reflux disease)   . Glaucoma   . Hypercholesterolemia   . Hypertension   . Thyroid nodule    Past Surgical History:  Procedure Laterality Date  . ABDOMINAL HYSTERECTOMY  1987   fibroids, ovaries not removed  . TONSILLECTOMY     Family History  Problem Relation Age of Onset  . Diabetes Father   . Hypertension Father   . Hypercholesterolemia Father   . Heart disease Father     CABG  . Hypertension Mother   . Hypercholesterolemia Mother   . Rheumatic fever Mother   . Hypertension Brother   . Diabetes Sister   . Breast cancer Maternal Aunt   . Breast cancer      cousin  . Colon cancer Neg Hx    Social History   Social History  . Marital status: Divorced    Spouse name: N/A  . Number of children: 2  . Years of education: N/A   Social History Main Topics  . Smoking status: Never Smoker  . Smokeless tobacco: Never Used  . Alcohol use No  . Drug use: No  . Sexual activity: Not Currently   Other Topics Concern  . None   Social History Narrative  . None    Outpatient Encounter Prescriptions as of 05/30/2016  Medication Sig  . aspirin 81 MG tablet Take 81 mg by mouth daily.  . fish oil-omega-3 fatty acids 1000 MG capsule Take 1 g by mouth 3 (three) times daily.  Marland Kitchen losartan (COZAAR) 100 MG tablet Take 1 tablet (100 mg total) by mouth daily.  . Multiple  Vitamin (MULTIVITAMIN) tablet Take 1 tablet by mouth daily.  . niacin 500 MG tablet Reported on 09/01/2015  . nystatin cream (MYCOSTATIN) Apply 1 application topically 2 (two) times daily.  Marland Kitchen spironolactone (ALDACTONE) 50 MG tablet Take 1 tablet (50 mg total) by mouth daily.  . [DISCONTINUED] spironolactone (ALDACTONE) 50 MG tablet TAKE 1 TABLET (50 MG TOTAL) BY MOUTH DAILY.   No facility-administered encounter medications on file as of 05/30/2016.     Review of Systems  Constitutional: Negative for appetite change and unexpected weight change.  HENT: Negative for congestion and sinus pressure.   Respiratory: Negative for cough, chest tightness and shortness of breath.   Cardiovascular: Negative for chest pain, palpitations and leg swelling.  Gastrointestinal: Negative for abdominal pain, diarrhea, nausea and vomiting.  Genitourinary: Negative for difficulty urinating and dysuria.  Musculoskeletal: Positive for back pain. Negative for joint swelling.  Neurological: Negative for dizziness, light-headedness and headaches.  Psychiatric/Behavioral: Negative for agitation, decreased concentration and dysphoric mood.       Objective:    Physical Exam  Constitutional: She appears well-developed and well-nourished. No distress.  HENT:  Nose: Nose normal.  Mouth/Throat: Oropharynx is clear and moist.  Neck: Neck supple. No thyromegaly present.  Cardiovascular: Normal rate and regular rhythm.   Pulmonary/Chest: Breath sounds normal. No respiratory distress. She has no wheezes.  Abdominal: Soft. Bowel sounds are normal. There is no tenderness.  Musculoskeletal: She exhibits no edema or tenderness.  Lymphadenopathy:    She has no cervical adenopathy.  Skin: No rash noted. No erythema.  Psychiatric: She has a normal mood and affect. Her behavior is normal.    BP 140/78 (BP Location: Left Arm, Patient Position: Sitting, Cuff Size: Normal)   Pulse 81   Temp 98.6 F (37 C) (Oral)   Resp 12    Ht 5' 2" (1.575 m)   Wt 167 lb 9.6 oz (76 kg)   LMP 02/21/1984   SpO2 98%   BMI 30.65 kg/m  Wt Readings from Last 3 Encounters:  05/30/16 167 lb 9.6 oz (76 kg)  04/25/16 166 lb 12.8 oz (75.7 kg)  01/11/16 167 lb 6.4 oz (75.9 kg)     Lab Results  Component Value Date   WBC 10.5 06/09/2016   HGB 13.2 06/09/2016   HCT 39.8 06/09/2016   PLT 298.0 06/09/2016   GLUCOSE 115 (H) 06/09/2016   CHOL 226 (H) 06/09/2016   TRIG 197.0 (H) 06/09/2016   HDL 48.90 06/09/2016   LDLDIRECT 163.4 10/25/2012   LDLCALC 138 (H) 06/09/2016   ALT 18 06/09/2016   AST 16 06/09/2016   NA 136 06/09/2016   K 4.9 06/09/2016   CL 101 06/09/2016   CREATININE 1.19 06/09/2016   BUN 21 06/09/2016   CO2 26 06/09/2016   TSH 1.68 01/11/2016   HGBA1C 6.0 06/09/2016    Mm Screening Breast Tomo Bilateral  Result Date: 09/30/2015 CLINICAL DATA:  Screening. EXAM: 2D DIGITAL SCREENING BILATERAL MAMMOGRAM WITH CAD AND ADJUNCT TOMO COMPARISON:  Previous exam(s). ACR Breast Density Category d: The breast tissue is extremely dense, which lowers the sensitivity of mammography. FINDINGS: There are no findings suspicious for malignancy. Images were processed with CAD. IMPRESSION: No mammographic evidence of malignancy. A result letter of this screening mammogram will be mailed directly to the patient. RECOMMENDATION: Screening mammogram in one year. (Code:SM-B-01Y) BI-RADS CATEGORY  1: Negative. Electronically Signed   By: Pamelia Hoit M.D.   On: 09/30/2015 09:15       Assessment & Plan:   Problem List Items Addressed This Visit    Hypercholesterolemia    Declines cholesterol medication.  Low cholesterol diet and exercise.  Follow lipid panel.        Relevant Medications   spironolactone (ALDACTONE) 50 MG tablet   Hyperglycemia    Low carb diet and exercise.  Follow met b and a1c.        Hypertension    Blood pressure overall improved.  Continue same medication regimen.  Follow pressures.  Follow metabolic panel.         Relevant Medications   spironolactone (ALDACTONE) 50 MG tablet   Low back pain - Primary    Persistent. Check xray.  Tylenol prn.  Follow.       Relevant Orders   DG Lumbar Spine 2-3 Views (Completed)       Einar Pheasant, MD

## 2016-06-08 ENCOUNTER — Telehealth: Payer: Self-pay | Admitting: Radiology

## 2016-06-08 DIAGNOSIS — H40053 Ocular hypertension, bilateral: Secondary | ICD-10-CM | POA: Diagnosis not present

## 2016-06-08 NOTE — Telephone Encounter (Signed)
Pt coming in for labs tomorrow, please place future orders. Thank you.  

## 2016-06-09 ENCOUNTER — Other Ambulatory Visit: Payer: Self-pay | Admitting: Internal Medicine

## 2016-06-09 ENCOUNTER — Other Ambulatory Visit (INDEPENDENT_AMBULATORY_CARE_PROVIDER_SITE_OTHER): Payer: PPO

## 2016-06-09 DIAGNOSIS — E7800 Pure hypercholesterolemia, unspecified: Secondary | ICD-10-CM

## 2016-06-09 DIAGNOSIS — R739 Hyperglycemia, unspecified: Secondary | ICD-10-CM

## 2016-06-09 DIAGNOSIS — I10 Essential (primary) hypertension: Secondary | ICD-10-CM

## 2016-06-09 LAB — CBC WITH DIFFERENTIAL/PLATELET
BASOS ABS: 0.1 10*3/uL (ref 0.0–0.1)
BASOS PCT: 0.8 % (ref 0.0–3.0)
Eosinophils Absolute: 0.2 10*3/uL (ref 0.0–0.7)
Eosinophils Relative: 2.2 % (ref 0.0–5.0)
HEMATOCRIT: 39.8 % (ref 36.0–46.0)
Hemoglobin: 13.2 g/dL (ref 12.0–15.0)
LYMPHS ABS: 2.6 10*3/uL (ref 0.7–4.0)
LYMPHS PCT: 24.5 % (ref 12.0–46.0)
MCHC: 33.3 g/dL (ref 30.0–36.0)
MCV: 90.8 fl (ref 78.0–100.0)
MONOS PCT: 7.2 % (ref 3.0–12.0)
Monocytes Absolute: 0.8 10*3/uL (ref 0.1–1.0)
NEUTROS ABS: 6.8 10*3/uL (ref 1.4–7.7)
NEUTROS PCT: 65.3 % (ref 43.0–77.0)
PLATELETS: 298 10*3/uL (ref 150.0–400.0)
RBC: 4.38 Mil/uL (ref 3.87–5.11)
RDW: 14.4 % (ref 11.5–15.5)
WBC: 10.5 10*3/uL (ref 4.0–10.5)

## 2016-06-09 LAB — BASIC METABOLIC PANEL
BUN: 21 mg/dL (ref 6–23)
CHLORIDE: 101 meq/L (ref 96–112)
CO2: 26 mEq/L (ref 19–32)
CREATININE: 1.19 mg/dL (ref 0.40–1.20)
Calcium: 10.3 mg/dL (ref 8.4–10.5)
GFR: 46.65 mL/min — ABNORMAL LOW (ref >60.00)
Glucose, Bld: 115 mg/dL — ABNORMAL HIGH (ref 70–99)
Potassium: 4.9 mEq/L (ref 3.5–5.1)
Sodium: 136 mEq/L (ref 135–145)

## 2016-06-09 LAB — HEPATIC FUNCTION PANEL
ALT: 18 U/L (ref 0–35)
AST: 16 U/L (ref 0–37)
Albumin: 4.5 g/dL (ref 3.5–5.2)
Alkaline Phosphatase: 70 U/L (ref 39–117)
BILIRUBIN DIRECT: 0.1 mg/dL (ref 0.0–0.3)
BILIRUBIN TOTAL: 0.5 mg/dL (ref 0.2–1.2)
TOTAL PROTEIN: 7.7 g/dL (ref 6.0–8.3)

## 2016-06-09 LAB — LIPID PANEL
CHOL/HDL RATIO: 5
CHOLESTEROL: 226 mg/dL — AB (ref 0–200)
HDL: 48.9 mg/dL (ref >39.00)
LDL Cholesterol: 138 mg/dL — ABNORMAL HIGH (ref 0–99)
NonHDL: 177.49
TRIGLYCERIDES: 197 mg/dL — AB (ref 0.0–149.0)
VLDL: 39.4 mg/dL (ref 0.0–40.0)

## 2016-06-09 LAB — HEMOGLOBIN A1C: Hgb A1c MFr Bld: 6 % (ref 4.6–6.5)

## 2016-06-09 NOTE — Progress Notes (Signed)
Orders placed for labs

## 2016-06-09 NOTE — Telephone Encounter (Signed)
Orders placed for labs

## 2016-06-10 ENCOUNTER — Other Ambulatory Visit: Payer: Self-pay | Admitting: Internal Medicine

## 2016-06-10 DIAGNOSIS — R944 Abnormal results of kidney function studies: Secondary | ICD-10-CM

## 2016-06-10 NOTE — Progress Notes (Signed)
Order placed for f/u met b.  

## 2016-06-12 NOTE — Assessment & Plan Note (Signed)
Low carb diet and exercise.  Follow met b and a1c.   

## 2016-06-12 NOTE — Assessment & Plan Note (Signed)
Declines cholesterol medication.  Low cholesterol diet and exercise.  Follow lipid panel.   

## 2016-06-12 NOTE — Assessment & Plan Note (Signed)
Persistent. Check xray.  Tylenol prn.  Follow.

## 2016-06-12 NOTE — Assessment & Plan Note (Signed)
Blood pressure overall improved.  Continue same medication regimen.  Follow pressures.  Follow metabolic panel.   

## 2016-06-13 ENCOUNTER — Telehealth: Payer: Self-pay | Admitting: *Deleted

## 2016-06-13 NOTE — Telephone Encounter (Signed)
Patient stated that her spironolactone was making her feel fatigue at 50mg . Patient is now taking 25mg  and monitoring her blood pressure, and she's feeling much better. Pt contact [phone]

## 2016-06-13 NOTE — Telephone Encounter (Signed)
Called patient Bp are running about 135/69 Patient is checking bid. Patient is feeling much better after change.

## 2016-06-14 NOTE — Telephone Encounter (Signed)
She was just in and feeling fine on 50mg  of spironolactone.  Will need to continue to monitor blood pressure.  If elevation, will need to let me know.

## 2016-06-14 NOTE — Telephone Encounter (Signed)
No answer no vm

## 2016-06-14 NOTE — Telephone Encounter (Signed)
Patient informed will call if and problems or BP goes up

## 2016-06-21 ENCOUNTER — Other Ambulatory Visit: Payer: PPO

## 2016-06-24 ENCOUNTER — Other Ambulatory Visit (INDEPENDENT_AMBULATORY_CARE_PROVIDER_SITE_OTHER): Payer: PPO

## 2016-06-24 ENCOUNTER — Telehealth: Payer: Self-pay | Admitting: Internal Medicine

## 2016-06-24 DIAGNOSIS — R944 Abnormal results of kidney function studies: Secondary | ICD-10-CM

## 2016-06-24 LAB — BASIC METABOLIC PANEL
BUN: 23 mg/dL (ref 6–23)
CHLORIDE: 102 meq/L (ref 96–112)
CO2: 23 mEq/L (ref 19–32)
CREATININE: 1.2 mg/dL (ref 0.40–1.20)
Calcium: 9.7 mg/dL (ref 8.4–10.5)
GFR: 46.2 mL/min — AB (ref >60.00)
Glucose, Bld: 93 mg/dL (ref 70–99)
POTASSIUM: 4.8 meq/L (ref 3.5–5.1)
Sodium: 133 mEq/L — ABNORMAL LOW (ref 135–145)

## 2016-06-24 NOTE — Telephone Encounter (Signed)
In  Your green folder for review

## 2016-06-24 NOTE — Telephone Encounter (Signed)
Patient will call if any problems understood all information

## 2016-06-24 NOTE — Telephone Encounter (Signed)
Blood pressures reviewed and are ok.  Continue current medication regimen and continue to follow.  Let us know if any problems.

## 2016-06-24 NOTE — Telephone Encounter (Signed)
Pt dropped off a envelope with her BP results. Results handed to [facility].

## 2016-06-25 ENCOUNTER — Other Ambulatory Visit: Payer: Self-pay | Admitting: Internal Medicine

## 2016-06-25 DIAGNOSIS — R7989 Other specified abnormal findings of blood chemistry: Secondary | ICD-10-CM

## 2016-06-25 NOTE — Progress Notes (Signed)
Order placed for f/u met b and urinalysis.   

## 2016-07-06 ENCOUNTER — Telehealth: Payer: Self-pay | Admitting: Internal Medicine

## 2016-07-06 NOTE — Telephone Encounter (Signed)
Reason for call:cough Symptoms: cough dry, sinus drainage in throat, she has antihistamine benadryl wants to know what you think about taking, what can she take for cough Duration 1 week Medications: Tylenol, cough drops,  Last seen for this problem: Seen by: Please advise

## 2016-07-06 NOTE — Telephone Encounter (Signed)
If having sinus drainage and cough, would recommend saline nasal spray - flush nose at least 2-3x/day, nasacort nasal spray - 2 sprays each nostril one time per day - do this in the evening.  Also can take robitussin DM twice a day as needed for cough and congestion.  If persistent problems, will need to be evaluated.

## 2016-07-06 NOTE — Telephone Encounter (Signed)
Tried calling patient-voice mailbox not set up.

## 2016-07-06 NOTE — Telephone Encounter (Signed)
Pt states that she has had a  bad cough for about a week now and wanted to know what she could take. Please advise.

## 2016-07-07 NOTE — Telephone Encounter (Signed)
Patient advised of below and verbalized understanding.  

## 2016-07-13 ENCOUNTER — Other Ambulatory Visit (INDEPENDENT_AMBULATORY_CARE_PROVIDER_SITE_OTHER): Payer: PPO

## 2016-07-13 DIAGNOSIS — R7989 Other specified abnormal findings of blood chemistry: Secondary | ICD-10-CM

## 2016-07-13 LAB — BASIC METABOLIC PANEL
BUN: 19 mg/dL (ref 6–23)
CALCIUM: 10 mg/dL (ref 8.4–10.5)
CO2: 25 meq/L (ref 19–32)
Chloride: 102 mEq/L (ref 96–112)
Creatinine, Ser: 1.25 mg/dL — ABNORMAL HIGH (ref 0.40–1.20)
GFR: 44.06 mL/min — ABNORMAL LOW (ref >60.00)
GLUCOSE: 99 mg/dL (ref 70–99)
Potassium: 5 mEq/L (ref 3.5–5.1)
SODIUM: 135 meq/L (ref 135–145)

## 2016-07-16 ENCOUNTER — Other Ambulatory Visit: Payer: Self-pay | Admitting: Internal Medicine

## 2016-07-16 DIAGNOSIS — N183 Chronic kidney disease, stage 3 unspecified: Secondary | ICD-10-CM

## 2016-07-16 NOTE — Progress Notes (Signed)
Order placed for nephrology referral.   °

## 2016-07-19 ENCOUNTER — Telehealth: Payer: Self-pay | Admitting: Internal Medicine

## 2016-07-19 NOTE — Telephone Encounter (Signed)
Pt called stating her daughter wants her to go to see the nephrologist at [facility]  Dr Olam Idler. In [place]. I did advised that a referral was already in place. Please advise? Thank you!  Call pt @ [phone].

## 2016-07-25 ENCOUNTER — Telehealth: Payer: Self-pay | Admitting: *Deleted

## 2016-07-25 NOTE — Telephone Encounter (Signed)
Patient stated that her referral to [facility] kidney did not work out, and she requested to go back to [place] contact [phone]

## 2016-08-09 ENCOUNTER — Telehealth: Payer: Self-pay | Admitting: *Deleted

## 2016-08-09 NOTE — Telephone Encounter (Signed)
Patient requested a update on her referral to [place] . Pt contact [phone]

## 2016-09-01 ENCOUNTER — Encounter: Payer: Self-pay | Admitting: Internal Medicine

## 2016-09-01 ENCOUNTER — Ambulatory Visit (INDEPENDENT_AMBULATORY_CARE_PROVIDER_SITE_OTHER): Payer: PPO | Admitting: Internal Medicine

## 2016-09-01 VITALS — BP 136/70 | HR 91 | Temp 98.6°F | Resp 12 | Ht 62.0 in | Wt 173.8 lb

## 2016-09-01 DIAGNOSIS — M25569 Pain in unspecified knee: Secondary | ICD-10-CM | POA: Diagnosis not present

## 2016-09-01 DIAGNOSIS — N183 Chronic kidney disease, stage 3 unspecified: Secondary | ICD-10-CM

## 2016-09-01 DIAGNOSIS — E7800 Pure hypercholesterolemia, unspecified: Secondary | ICD-10-CM

## 2016-09-01 DIAGNOSIS — I10 Essential (primary) hypertension: Secondary | ICD-10-CM | POA: Diagnosis not present

## 2016-09-01 DIAGNOSIS — R739 Hyperglycemia, unspecified: Secondary | ICD-10-CM | POA: Diagnosis not present

## 2016-09-01 MED ORDER — LOSARTAN POTASSIUM 50 MG PO TABS: ORAL_TABLET | ORAL | 3 refills | Status: DC

## 2016-09-01 MED ORDER — SPIRONOLACTONE 25 MG PO TABS: 25.0000 mg | ORAL_TABLET | Freq: Every day | ORAL | 3 refills | Status: DC

## 2016-09-01 NOTE — Progress Notes (Signed)
Pre-visit discussion using our clinic review tool. No additional management support is needed unless otherwise documented below in the visit note.  

## 2016-09-01 NOTE — Progress Notes (Signed)
Patient ID: Cathy Vazquez, female   DOB: 18-Oct-1940, 76 y.o.   MRN: [id number]   Subjective:    Patient ID: Cathy Vazquez, female    DOB: 12-Jun-1940, 76 y.o.   MRN: [id number]  HPI  Patient here for a scheduled follow up.  States she is doing relatively well.  Occasional left knee discomfort.  Desires no further evaluation or w/up.  Tries to stay active.  No chest pain.  No sob.  No acid reflux.  No abdominal pain.  Bowels moving.  Planning to see nephrology 09/22/16 for elevated creatine and CKD.  Outside blood pressure checks averaging 120-130s/60-70s.  She has adjusted her medications.  She is taking 45m of spironolactone now and 527mbid of losartan.  Feels tolerates this better.  No headache or dizziness.     Past Medical History:  Diagnosis Date  . Anemia   . Diverticulosis   . GERD (gastroesophageal reflux disease)   . Glaucoma   . Hypercholesterolemia   . Hypertension   . Thyroid nodule    Past Surgical History:  Procedure Laterality Date  . ABDOMINAL HYSTERECTOMY  1987   fibroids, ovaries not removed  . TONSILLECTOMY     Family History  Problem Relation Age of Onset  . Diabetes Father   . Hypertension Father   . Hypercholesterolemia Father   . Heart disease Father        CABG  . Hypertension Mother   . Hypercholesterolemia Mother   . Rheumatic fever Mother   . Hypertension Brother   . Diabetes Sister   . Breast cancer Maternal Aunt   . Breast cancer Unknown        cousin  . Colon cancer Neg Hx    Social History   Social History  . Marital status: Divorced    Spouse name: N/A  . Number of children: 2  . Years of education: N/A   Social History Main Topics  . Smoking status: Never Smoker  . Smokeless tobacco: Never Used  . Alcohol use No  . Drug use: No  . Sexual activity: Not Currently   Other Topics Concern  . None   Social History Narrative  . None    Outpatient Encounter Prescriptions as of 09/01/2016  Medication Sig  . aspirin 81  MG tablet Take 81 mg by mouth daily.  . diclofenac sodium (VOLTAREN) 1 % GEL diclofenac 1 % topical gel  . fish oil-omega-3 fatty acids 1000 MG capsule Take 1 g by mouth 3 (three) times daily.  . Multiple Vitamin (MULTIVITAMIN) tablet Take 1 tablet by mouth daily.  . [DISCONTINUED] losartan (COZAAR) 100 MG tablet Take 1 tablet (100 mg total) by mouth daily.  . [DISCONTINUED] spironolactone (ALDACTONE) 50 MG tablet Take 1 tablet (50 mg total) by mouth daily.  . Marland Kitchenosartan (COZAAR) 50 MG tablet Take one tablet bid  . niacin 500 MG tablet Reported on 09/01/2015  . spironolactone (ALDACTONE) 25 MG tablet Take 1 tablet (25 mg total) by mouth daily.  . [DISCONTINUED] nystatin cream (MYCOSTATIN) Apply 1 application topically 2 (two) times daily.   No facility-administered encounter medications on file as of 09/01/2016.     Review of Systems  Constitutional: Negative for appetite change and unexpected weight change.  HENT: Negative for congestion and sinus pressure.   Respiratory: Negative for cough, chest tightness and shortness of breath.   Cardiovascular: Negative for chest pain, palpitations and leg swelling.  Gastrointestinal: Negative for abdominal pain, diarrhea, nausea and vomiting.  Genitourinary: Negative for difficulty urinating and dysuria.  Musculoskeletal: Negative for joint swelling and myalgias.  Skin: Negative for color change and rash.  Neurological: Negative for dizziness, light-headedness and headaches.  Psychiatric/Behavioral: Negative for agitation and dysphoric mood.       Objective:     Blood pressure rechecked by me:  134/74  Physical Exam  Constitutional: She appears well-developed and well-nourished. No distress.  HENT:  Nose: Nose normal.  Mouth/Throat: Oropharynx is clear and moist.  Neck: Neck supple. No thyromegaly present.  Cardiovascular: Normal rate and regular rhythm.   Pulmonary/Chest: Breath sounds normal. No respiratory distress. She has no wheezes.    Abdominal: Soft. Bowel sounds are normal. There is no tenderness.  Musculoskeletal: She exhibits no edema or tenderness.  Lymphadenopathy:    She has no cervical adenopathy.  Skin: No rash noted. No erythema.  Psychiatric: She has a normal mood and affect. Her behavior is normal.    BP 136/70 (BP Location: Left Arm, Patient Position: Sitting, Cuff Size: Normal)   Pulse 91   Temp 98.6 F (37 C) (Oral)   Resp 12   Ht 5' 2" (1.575 m)   Wt 173 lb 12.8 oz (78.8 kg)   LMP 02/21/1984   SpO2 97%   BMI 31.79 kg/m  Wt Readings from Last 3 Encounters:  09/01/16 173 lb 12.8 oz (78.8 kg)  05/30/16 167 lb 9.6 oz (76 kg)  04/25/16 166 lb 12.8 oz (75.7 kg)     Lab Results  Component Value Date   WBC 10.5 06/09/2016   HGB 13.2 06/09/2016   HCT 39.8 06/09/2016   PLT 298.0 06/09/2016   GLUCOSE 93 09/01/2016   CHOL 226 (H) 06/09/2016   TRIG 197.0 (H) 06/09/2016   HDL 48.90 06/09/2016   LDLDIRECT 163.4 10/25/2012   LDLCALC 138 (H) 06/09/2016   ALT 18 06/09/2016   AST 16 06/09/2016   NA 131 (L) 09/01/2016   K 4.9 09/01/2016   CL 99 09/01/2016   CREATININE 1.06 09/01/2016   BUN 21 09/01/2016   CO2 23 09/01/2016   TSH 1.68 01/11/2016   HGBA1C 6.0 06/09/2016    Mm Screening Breast Tomo Bilateral  Result Date: 09/30/2015 CLINICAL DATA:  Screening. EXAM: 2D DIGITAL SCREENING BILATERAL MAMMOGRAM WITH CAD AND ADJUNCT TOMO COMPARISON:  Previous exam(s). ACR Breast Density Category d: The breast tissue is extremely dense, which lowers the sensitivity of mammography. FINDINGS: There are no findings suspicious for malignancy. Images were processed with CAD. IMPRESSION: No mammographic evidence of malignancy. A result letter of this screening mammogram will be mailed directly to the patient. RECOMMENDATION: Screening mammogram in one year. (Code:SM-B-01Y) BI-RADS CATEGORY  1: Negative. Electronically Signed   By: Pamelia Hoit M.D.   On: 09/30/2015 09:15       Assessment & Plan:   Problem List  Items Addressed This Visit    CKD (chronic kidney disease)    Avoid antiinflammatories.  Has appt with nephrology 09/22/16.        Hypercholesterolemia    She declines cholesterol medication.  Low cholesterol diet and exercise.  Follow lipid panel and liver function tests.        Relevant Medications   losartan (COZAAR) 50 MG tablet   spironolactone (ALDACTONE) 25 MG tablet   Hyperglycemia    Low carb diet and exercise.  Follow met b and a1c.  Discussed diet and exercise today.        Hypertension - Primary    Blood pressure under good  control on current regimen.  She has adjusted her medication.    Continue same medication regimen.  Follow pressures.  Follow metabolic panel.        Relevant Medications   losartan (COZAAR) 50 MG tablet   spironolactone (ALDACTONE) 25 MG tablet   Other Relevant Orders   Basic metabolic panel (Completed)   Knee pain    Desires no further intervention at this time.  Follow.            Einar Pheasant, MD

## 2016-09-02 LAB — BASIC METABOLIC PANEL
BUN: 21 mg/dL (ref 6–23)
CALCIUM: 9.9 mg/dL (ref 8.4–10.5)
CO2: 23 meq/L (ref 19–32)
Chloride: 99 mEq/L (ref 96–112)
Creatinine, Ser: 1.06 mg/dL (ref 0.40–1.20)
GFR: 53.28 mL/min — AB (ref >60.00)
GLUCOSE: 93 mg/dL (ref 70–99)
POTASSIUM: 4.9 meq/L (ref 3.5–5.1)
SODIUM: 131 meq/L — AB (ref 135–145)

## 2016-09-03 ENCOUNTER — Encounter: Payer: Self-pay | Admitting: Internal Medicine

## 2016-09-03 DIAGNOSIS — N189 Chronic kidney disease, unspecified: Secondary | ICD-10-CM | POA: Insufficient documentation

## 2016-09-03 DIAGNOSIS — N183 Chronic kidney disease, stage 3 unspecified: Secondary | ICD-10-CM | POA: Insufficient documentation

## 2016-09-03 NOTE — Assessment & Plan Note (Signed)
Low carb diet and exercise.  Follow met b and a1c.  Discussed diet and exercise today.

## 2016-09-03 NOTE — Assessment & Plan Note (Signed)
She declines cholesterol medication.  Low cholesterol diet and exercise.  Follow lipid panel and liver function tests.

## 2016-09-03 NOTE — Assessment & Plan Note (Signed)
Desires no further intervention at this time.  Follow  

## 2016-09-03 NOTE — Assessment & Plan Note (Signed)
Blood pressure under good control on current regimen.  She has adjusted her medication.    Continue same medication regimen.  Follow pressures.  Follow metabolic panel.

## 2016-09-03 NOTE — Assessment & Plan Note (Signed)
Avoid antiinflammatories.  Has appt with nephrology 09/22/16.

## 2016-09-04 ENCOUNTER — Other Ambulatory Visit: Payer: Self-pay | Admitting: Internal Medicine

## 2016-09-04 DIAGNOSIS — E871 Hypo-osmolality and hyponatremia: Secondary | ICD-10-CM

## 2016-09-04 NOTE — Progress Notes (Signed)
Order placed for f/u sodium check.   

## 2016-09-09 ENCOUNTER — Other Ambulatory Visit (INDEPENDENT_AMBULATORY_CARE_PROVIDER_SITE_OTHER): Payer: PPO

## 2016-09-09 DIAGNOSIS — E871 Hypo-osmolality and hyponatremia: Secondary | ICD-10-CM | POA: Diagnosis not present

## 2016-09-09 LAB — SODIUM: Sodium: 131 mEq/L — ABNORMAL LOW (ref 135–145)

## 2016-09-11 ENCOUNTER — Other Ambulatory Visit: Payer: Self-pay | Admitting: Internal Medicine

## 2016-09-11 DIAGNOSIS — E871 Hypo-osmolality and hyponatremia: Secondary | ICD-10-CM

## 2016-09-11 NOTE — Progress Notes (Signed)
Order placed for f/u met b.  

## 2016-09-23 ENCOUNTER — Other Ambulatory Visit: Payer: PPO

## 2016-09-26 ENCOUNTER — Other Ambulatory Visit (INDEPENDENT_AMBULATORY_CARE_PROVIDER_SITE_OTHER): Payer: PPO

## 2016-09-26 ENCOUNTER — Other Ambulatory Visit: Payer: Self-pay | Admitting: Internal Medicine

## 2016-09-26 DIAGNOSIS — R7989 Other specified abnormal findings of blood chemistry: Secondary | ICD-10-CM

## 2016-09-26 DIAGNOSIS — E871 Hypo-osmolality and hyponatremia: Secondary | ICD-10-CM | POA: Diagnosis not present

## 2016-09-26 NOTE — Addendum Note (Signed)
Addended by: Leeanne Rio on: 09/26/2016 02:52 PM   Modules accepted: Orders

## 2016-09-26 NOTE — Addendum Note (Signed)
Addended by: Arby Barrette on: 09/26/2016 10:00 AM   Modules accepted: Orders

## 2016-09-27 LAB — BASIC METABOLIC PANEL
BUN: 24 mg/dL (ref 7–25)
CALCIUM: 9.5 mg/dL (ref 8.6–10.4)
CHLORIDE: 104 mmol/L (ref 98–110)
CO2: 23 mmol/L (ref 20–32)
CREATININE: 1.12 mg/dL — AB (ref 0.60–0.93)
Glucose, Bld: 99 mg/dL (ref 65–99)
Potassium: 4.6 mmol/L (ref 3.5–5.3)
SODIUM: 136 mmol/L (ref 135–146)

## 2016-09-27 LAB — URINALYSIS, ROUTINE W REFLEX MICROSCOPIC
BILIRUBIN URINE: NEGATIVE
GLUCOSE, UA: NEGATIVE
KETONES UR: NEGATIVE
Nitrite: NEGATIVE
PH: 5.5 (ref 5.0–8.0)
Specific Gravity, Urine: 1.017 (ref 1.001–1.035)

## 2016-09-27 LAB — URINALYSIS, MICROSCOPIC ONLY
CASTS: NONE SEEN [LPF]
Crystals: NONE SEEN [HPF]
YEAST: NONE SEEN [HPF]

## 2016-09-28 ENCOUNTER — Other Ambulatory Visit: Payer: Self-pay | Admitting: Internal Medicine

## 2016-09-28 DIAGNOSIS — R319 Hematuria, unspecified: Secondary | ICD-10-CM

## 2016-09-28 NOTE — Progress Notes (Signed)
Order placed for f/u urinalysis 

## 2016-09-30 DIAGNOSIS — N390 Urinary tract infection, site not specified: Secondary | ICD-10-CM | POA: Diagnosis not present

## 2016-09-30 DIAGNOSIS — I10 Essential (primary) hypertension: Secondary | ICD-10-CM | POA: Diagnosis not present

## 2016-09-30 DIAGNOSIS — N183 Chronic kidney disease, stage 3 (moderate): Secondary | ICD-10-CM | POA: Diagnosis not present

## 2016-10-03 ENCOUNTER — Telehealth: Payer: Self-pay | Admitting: Internal Medicine

## 2016-10-03 NOTE — Telephone Encounter (Signed)
Patient states that you spoke to her last time she was in about Symvisc injections in her knee. She states that she she was seen by ortho but only sent for PT and that is not what she wants. She wants to wait until her follow up with you in September for any referral she just wanted to make sure you are aware so she can discuss at her follow up. I have put note in next app as well.

## 2016-10-03 NOTE — Telephone Encounter (Signed)
Pt stated that she was ready for the referral for her to start getting injections in her Knee and Hip. Please advise

## 2016-10-04 ENCOUNTER — Other Ambulatory Visit: Payer: Self-pay

## 2016-10-05 ENCOUNTER — Other Ambulatory Visit (INDEPENDENT_AMBULATORY_CARE_PROVIDER_SITE_OTHER): Payer: PPO

## 2016-10-05 DIAGNOSIS — R319 Hematuria, unspecified: Secondary | ICD-10-CM | POA: Diagnosis not present

## 2016-10-05 LAB — URINALYSIS, ROUTINE W REFLEX MICROSCOPIC
Bilirubin Urine: NEGATIVE
Ketones, ur: NEGATIVE
Nitrite: NEGATIVE
PH: 6 (ref 5.0–8.0)
SPECIFIC GRAVITY, URINE: 1.01 (ref 1.000–1.030)
Total Protein, Urine: NEGATIVE
Urine Glucose: NEGATIVE
Urobilinogen, UA: 0.2 (ref 0.0–1.0)

## 2016-10-07 ENCOUNTER — Telehealth: Payer: Self-pay | Admitting: Internal Medicine

## 2016-10-07 ENCOUNTER — Other Ambulatory Visit: Payer: Self-pay | Admitting: Internal Medicine

## 2016-10-07 DIAGNOSIS — R319 Hematuria, unspecified: Secondary | ICD-10-CM

## 2016-10-07 DIAGNOSIS — M25569 Pain in unspecified knee: Secondary | ICD-10-CM

## 2016-10-07 NOTE — Telephone Encounter (Signed)
This in regards to the last phone message about knee pain she would like to have referral now if she can get it.

## 2016-10-07 NOTE — Telephone Encounter (Signed)
Pt called stating she needs a referral to the orthopedic Dr name is Reche Dixon. Thank you!

## 2016-10-07 NOTE — Progress Notes (Signed)
Order placed for urology referral.  

## 2016-10-09 NOTE — Telephone Encounter (Signed)
Order placed for ortho referral.   

## 2016-10-15 ENCOUNTER — Other Ambulatory Visit: Payer: Self-pay | Admitting: Internal Medicine

## 2016-10-20 ENCOUNTER — Ambulatory Visit: Payer: Self-pay | Admitting: Urology

## 2016-11-18 ENCOUNTER — Telehealth: Payer: Self-pay | Admitting: *Deleted

## 2016-11-18 NOTE — Telephone Encounter (Signed)
Ok.  Let me know if I need to do anything else.

## 2016-11-18 NOTE — Telephone Encounter (Signed)
Pt has requested a sooner appt befor 10/26 with Dr. Nicki Reaper, Pt has right  hip pain that is causing her to have trouble walking-please advise  Pt contact [phone]

## 2016-11-18 NOTE — Telephone Encounter (Signed)
Spoke to patient she states that she never made app with orhto for this. She had to c/a  and didn't call back. I gave number she will call office and make app to be seen with [facility].

## 2016-11-24 DIAGNOSIS — E669 Obesity, unspecified: Secondary | ICD-10-CM | POA: Diagnosis not present

## 2016-11-24 DIAGNOSIS — M1612 Unilateral primary osteoarthritis, left hip: Secondary | ICD-10-CM | POA: Diagnosis not present

## 2016-11-24 DIAGNOSIS — M25552 Pain in left hip: Secondary | ICD-10-CM | POA: Diagnosis not present

## 2016-11-24 DIAGNOSIS — M1611 Unilateral primary osteoarthritis, right hip: Secondary | ICD-10-CM | POA: Diagnosis not present

## 2016-12-05 DIAGNOSIS — H40053 Ocular hypertension, bilateral: Secondary | ICD-10-CM | POA: Diagnosis not present

## 2016-12-12 ENCOUNTER — Other Ambulatory Visit: Payer: Self-pay | Admitting: Internal Medicine

## 2016-12-13 ENCOUNTER — Other Ambulatory Visit: Payer: Self-pay | Admitting: Internal Medicine

## 2016-12-13 DIAGNOSIS — H40053 Ocular hypertension, bilateral: Secondary | ICD-10-CM | POA: Diagnosis not present

## 2016-12-16 ENCOUNTER — Encounter: Payer: PPO | Admitting: Internal Medicine

## 2016-12-27 DIAGNOSIS — M9905 Segmental and somatic dysfunction of pelvic region: Secondary | ICD-10-CM | POA: Diagnosis not present

## 2016-12-27 DIAGNOSIS — M9904 Segmental and somatic dysfunction of sacral region: Secondary | ICD-10-CM | POA: Diagnosis not present

## 2016-12-27 DIAGNOSIS — M5417 Radiculopathy, lumbosacral region: Secondary | ICD-10-CM | POA: Diagnosis not present

## 2016-12-27 DIAGNOSIS — M5136 Other intervertebral disc degeneration, lumbar region: Secondary | ICD-10-CM | POA: Diagnosis not present

## 2016-12-27 DIAGNOSIS — M7918 Myalgia, other site: Secondary | ICD-10-CM | POA: Diagnosis not present

## 2016-12-27 DIAGNOSIS — M545 Low back pain: Secondary | ICD-10-CM | POA: Diagnosis not present

## 2016-12-27 DIAGNOSIS — M1611 Unilateral primary osteoarthritis, right hip: Secondary | ICD-10-CM | POA: Diagnosis not present

## 2016-12-27 DIAGNOSIS — M9903 Segmental and somatic dysfunction of lumbar region: Secondary | ICD-10-CM | POA: Diagnosis not present

## 2017-01-13 ENCOUNTER — Other Ambulatory Visit: Payer: Self-pay | Admitting: Internal Medicine

## 2017-02-03 ENCOUNTER — Encounter: Payer: PPO | Admitting: Internal Medicine

## 2017-02-14 ENCOUNTER — Other Ambulatory Visit: Payer: Self-pay | Admitting: Internal Medicine

## 2017-03-15 ENCOUNTER — Other Ambulatory Visit: Payer: Self-pay | Admitting: Internal Medicine

## 2017-04-13 ENCOUNTER — Other Ambulatory Visit: Payer: Self-pay

## 2017-04-13 MED ORDER — SPIRONOLACTONE 25 MG PO TABS: 25.0000 mg | ORAL_TABLET | Freq: Every day | ORAL | 2 refills | Status: DC

## 2017-04-17 ENCOUNTER — Other Ambulatory Visit: Payer: Self-pay | Admitting: Internal Medicine

## 2017-04-26 ENCOUNTER — Ambulatory Visit (INDEPENDENT_AMBULATORY_CARE_PROVIDER_SITE_OTHER): Payer: PPO

## 2017-04-26 VITALS — BP 132/78 | HR 90 | Temp 98.3°F | Resp 15 | Ht 63.0 in | Wt 167.1 lb

## 2017-04-26 DIAGNOSIS — Z0000 Encounter for general adult medical examination without abnormal findings: Secondary | ICD-10-CM

## 2017-04-26 DIAGNOSIS — Z1239 Encounter for other screening for malignant neoplasm of breast: Secondary | ICD-10-CM

## 2017-04-26 NOTE — Progress Notes (Addendum)
Subjective:   Cathy Vazquez is a 77 y.o. female who presents for Medicare Annual (Subsequent) preventive examination.  Review of Systems:  No ROS.  Medicare Wellness Visit. Additional risk factors are reflected in the social history.  Cardiac Risk Factors include: advanced age (>66men, >3 women);hypertension     Objective:     Vitals: BP 132/78 (BP Location: Left Arm, Patient Position: Sitting, Cuff Size: Normal)   Pulse 90   Temp 98.3 F (36.8 C) (Oral)   Resp 15   Ht 5\' 3"  (1.6 m)   Wt 167 lb 1.9 oz (75.8 kg)   LMP 02/21/1984   SpO2 94%   BMI 29.60 kg/m   Body mass index is 29.6 kg/m.  Advanced Directives 04/26/2017 04/25/2016 02/26/2015  Does Patient Have a Medical Advance Directive? Yes Yes Yes  Type of Paramedic of [place];Living will Living will;Healthcare Power of [place];Living will  Does patient want to make changes to medical advance directive? No - Patient declined No - Patient declined No - Patient declined  Copy of [place] in Chart? Yes Yes No - copy requested    Tobacco Social History   Tobacco Use  Smoking Status Never Smoker  Smokeless Tobacco Never Used     Counseling given: Not Answered   Clinical Intake:  Pre-visit preparation completed: Yes  Pain : No/denies pain     Nutritional Status: BMI > 30  Obese Diabetes: No  How often do you need to have someone help you when you read instructions, pamphlets, or other written materials from your doctor or pharmacy?: 1 - Never  Interpreter Needed?: No     Past Medical History:  Diagnosis Date  . Anemia   . Diverticulosis   . GERD (gastroesophageal reflux disease)   . Glaucoma   . Hypercholesterolemia   . Hypertension   . Thyroid nodule    Past Surgical History:  Procedure Laterality Date  . ABDOMINAL HYSTERECTOMY  1987   fibroids, ovaries not removed  . TONSILLECTOMY     Family History  Problem  Relation Age of Onset  . Diabetes Father   . Hypertension Father   . Hypercholesterolemia Father   . Heart disease Father        CABG  . Hypertension Mother   . Hypercholesterolemia Mother   . Rheumatic fever Mother   . Hypertension Brother   . Diabetes Sister   . Breast cancer Maternal Aunt   . Breast cancer Unknown        cousin  . Colon cancer Neg Hx    Social History   Socioeconomic History  . Marital status: Divorced    Spouse name: None  . Number of children: 2  . Years of education: None  . Highest education level: None  Social Needs  . Financial resource strain: Not hard at all  . Food insecurity - worry: Never true  . Food insecurity - inability: Never true  . Transportation needs - medical: No  . Transportation needs - non-medical: No  Occupational History  . None  Tobacco Use  . Smoking status: Never Smoker  . Smokeless tobacco: Never Used  Substance and Sexual Activity  . Alcohol use: No    Alcohol/week: 0.0 oz  . Drug use: No  . Sexual activity: Not Currently  Other Topics Concern  . None  Social History Narrative  . None    Outpatient Encounter Medications as of 04/26/2017  Medication Sig  .  acetaminophen (TYLENOL) 325 MG tablet Take 650 mg by mouth every 4 (four) hours as needed.  Marland Kitchen aspirin 81 MG tablet Take 81 mg by mouth daily.  . diclofenac sodium (VOLTAREN) 1 % GEL diclofenac 1 % topical gel  . fish oil-omega-3 fatty acids 1000 MG capsule Take 1 g by mouth 3 (three) times daily.  Marland Kitchen losartan (COZAAR) 50 MG tablet TAKE 1 TABLET BY MOUTH TWICE A DAY  . meloxicam (MOBIC) 7.5 MG tablet Take 7.5 mg by mouth daily as needed for pain.  . Multiple Vitamin (MULTIVITAMIN) tablet Take 1 tablet by mouth daily.  . niacin 500 MG tablet Reported on 09/01/2015  . spironolactone (ALDACTONE) 25 MG tablet Take 1 tablet (25 mg total) by mouth daily.  . Turmeric 500 MG TABS Take 500 mg by mouth 3 (three) times daily.   No facility-administered encounter  medications on file as of 04/26/2017.     Activities of Daily Living In your present state of health, do you have any difficulty performing the following activities: 04/26/2017  Hearing? N  Vision? N  Difficulty concentrating or making decisions? N  Walking or climbing stairs? Y  Comment R hip pain, intermittent  Dressing or bathing? N  Doing errands, shopping? N  Preparing Food and eating ? N  Using the Toilet? N  In the past six months, have you accidently leaked urine? N  Do you have problems with loss of bowel control? N  Managing your Medications? N  Managing your Finances? N  Housekeeping or managing your Housekeeping? Y  Comment Daughter assists as needed  Some recent data might be hidden    Patient Care Team: Einar Pheasant, MD as PCP - General (Internal Medicine)    Assessment:   This is a routine wellness examination for Cathy Vazquez.  The goal of the wellness visit is to assist the patient how to close the gaps in care and create a preventative care plan for the patient.   The roster of all physicians providing medical care to patient is listed in the Snapshot section of the chart.  Taking calcium VIT D as appropriate/Osteoporosis risk reviewed.    Safety issues reviewed; Smoke and carbon monoxide detectors in the home. No firearms or firearms locked in a safe within the home. Wears seatbelts when driving or riding with others. No violence in the home.  They do not have excessive sun exposure.  Discussed the need for sun protection: hats, long sleeves and the use of sunscreen if there is significant sun exposure.  Patient is alert, normal appearance, oriented to person/place/and time.  Correctly identified the president of the [place] and recalls of 3/3 words. Performs simple calculations and can read correct time from watch face.  Displays appropriate judgement.  No new identified risk were noted.  No failures at ADL's or IADL's.    BMI- discussed the importance of a  healthy diet, water intake and the benefits of aerobic exercise. Educational material provided.   24 hour diet recall: Regular diet  Dental- every 12 months.  Eye- Visual acuity not assessed per patient preference since they have regular follow up with the ophthalmologist.  Wears corrective lenses.  Sleep patterns- Sleeps 6-8 hours at night.  Wakes feeling rested.  TDAP vaccine deferred per patient preference.  Follow up with insurance.  Educational material provided.  Patient Concerns: None at this time. Follow up with PCP as needed.  Exercise Activities and Dietary recommendations Current Exercise Habits: Home exercise routine, Type of exercise: walking(bed/standing/chair  exercises), Time (Minutes): 20, Frequency (Times/Week): 4, Weekly Exercise (Minutes/Week): 80, Intensity: Mild  Goals    . Increase fluid intake     Stay hydrated and drink plenty of fluids (gatorade, propel water with electrolytes)        Fall Risk Fall Risk  04/26/2017 04/25/2016 01/11/2016 08/21/2015 02/26/2015  Falls in the past year? No No No No Yes  Number falls in past yr: - - - - 1  Follow up - - - - Education provided;Falls prevention discussed   Depression Screen PHQ 2/9 Scores 04/26/2017 04/25/2016 01/11/2016 08/21/2015  PHQ - 2 Score 0 0 0 0     Cognitive Function MMSE - Mini Mental State Exam 04/26/2017 04/25/2016 02/26/2015  Orientation to time 5 5 5   Orientation to Place 5 5 5   Registration 3 3 3   Attention/ Calculation 5 5 5   Recall 3 3 3   Language- name 2 objects 2 2 2   Language- repeat 1 1 1   Language- follow 3 step command 3 3 3   Language- read & follow direction 1 1 1   Write a sentence 1 1 1   Copy design 1 1 1   Total score 30 30 30         Immunization History  Administered Date(s) Administered  . Influenza Split 10/29/2012  . Pneumococcal Conjugate-13 10/29/2012  . Zoster 02/21/2009    Screening Tests Health Maintenance  Topic Date Due  . TETANUS/TDAP  08/15/1959  . MAMMOGRAM   09/28/2016  . INFLUENZA VACCINE  Completed  . DEXA SCAN  Completed  . PNA vac Low Risk Adult  Discontinued       Plan:    End of life planning; Advance aging; Advanced directives discussed. Copy of current HCPOA/Living Will on file.    I have personally reviewed and noted the following in the patient's chart:   . Medical and social history . Use of alcohol, tobacco or illicit drugs  . Current medications and supplements . Functional ability and status . Nutritional status . Physical activity . Advanced directives . List of other physicians . Hospitalizations, surgeries, and ER visits in previous 12 months . Vitals . Screenings to include cognitive, depression, and falls . Referrals and appointments  In addition, I have reviewed and discussed with patient certain preventive protocols, quality metrics, and best practice recommendations. A written personalized care plan for preventive services as well as general preventive health recommendations were provided to patient.     OBrien-Blaney, Shanyce Daris L, LPN  03/26/4495    I have reviewed the above information and agree with above.   Deborra Medina, MD

## 2017-04-26 NOTE — Patient Instructions (Addendum)
[name] , Thank you for taking time to come for your Medicare Wellness Visit. I appreciate your ongoing commitment to your health goals. Please review the following plan we discussed and let me know if I can assist you in the future.   Follow up with Dr. Nicki Reaper as needed.    Mammogram ordered; follow as directed.  Have a great day!  These are the goals we discussed: Goals    . Increase fluid intake     Stay hydrated and drink plenty of fluids (gatorade, propel water with electrolytes)        This is a list of the screening recommended for you and due dates:  Health Maintenance  Topic Date Due  . Tetanus Vaccine  08[birth year]15/1959  . Mammogram  09/28/2016  . Flu Shot  Completed  . DEXA scan (bone density measurement)  Completed  . Pneumonia vaccines  Discontinued    Mammogram A mammogram is an X-ray of the breasts that is done to check for abnormal changes. This procedure can screen for and detect any changes that may suggest breast cancer. A mammogram can also identify other changes and variations in the breast, such as:  Inflammation of the breast tissue (mastitis).  An infected area that contains a collection of pus (abscess).  A fluid-filled sac (cyst).  Fibrocystic changes. This is when breast tissue becomes denser, which can make the tissue feel rope-like or uneven under the skin.  Tumors that are not cancerous (benign).  Tell a health care provider about:  Any allergies you have.  If you have breast implants.  If you have had previous breast disease, biopsy, or surgery.  If you are breastfeeding.  Any possibility that you could be pregnant, if this applies.  If you are younger than age 31.  If you have a family history of breast cancer. What are the risks? Generally, this is a safe procedure. However, problems may occur, including:  Exposure to radiation. Radiation levels are very low with this test.  The results being misinterpreted.  The need for  further tests.  The inability of the mammogram to detect certain cancers.  What happens before the procedure?  Schedule your test about 1-2 weeks after your menstrual period. This is usually when your breasts are the least tender.  If you have had a mammogram done at a different facility in the past, get the mammogram X-rays or have them sent to your current exam facility in order to compare them.  Wash your breasts and under your arms the day of the test.  Do not wear deodorants, perfumes, lotions, or powders anywhere on your body on the day of the test.  Remove any jewelry from your neck.  Wear clothes that you can change into and out of easily. What happens during the procedure?  You will undress from the waist up and put on a gown.  You will stand in front of the X-ray machine.  Each breast will be placed between two plastic or glass plates. The plates will compress your breast for a few seconds. Try to stay as relaxed as possible during the procedure. This does not cause any harm to your breasts and any discomfort you feel will be very brief.  X-rays will be taken from different angles of each breast. The procedure may vary among health care providers and hospitals. What happens after the procedure?  The mammogram will be examined by a specialist (radiologist).  You may need to repeat certain parts  of the test, depending on the quality of the images. This is commonly done if the radiologist needs a better view of the breast tissue.  Ask when your test results will be ready. Make sure you get your test results.  You may resume your normal activities. This information is not intended to replace advice given to you by your health care provider. Make sure you discuss any questions you have with your health care provider. Document Released: 02/05/2000 Document Revised: 07/13/2015 Document Reviewed: 04/18/2014 Elsevier Interactive Patient Education  [employer].

## 2017-05-08 ENCOUNTER — Telehealth: Payer: Self-pay

## 2017-05-08 ENCOUNTER — Ambulatory Visit: Payer: Self-pay

## 2017-05-08 NOTE — Telephone Encounter (Signed)
FYI

## 2017-05-08 NOTE — Telephone Encounter (Signed)
Please call and confirm pt evaluated.

## 2017-05-08 NOTE — Telephone Encounter (Signed)
Pt states she feels she is having a reaction to Delsyn. She has had a productive cough and Friday began taking the Delsyn. Today she is having tingling, dizzyness to both sides of the body, mouth and throat are dry. BP is elevated 171/96 and repeat BP: 181/83. Attempted to get appt but unable to for today or tomorrow. Pt advised to go to [facility] to be evaluated. Reason for Disposition . [1] Numbness or tingling on both sides of body AND [2] is a new symptom present > 24 hours . [6] Systolic BP  >= 761 OR Diastolic >= 950 AND [9] cardiac or neurologic symptoms (e.g., chest pain, difficulty breathing, unsteady gait, blurred vision) . Ran out of BP medications  Answer Assessment - Initial Assessment Questions 1. SYMPTOM: "What is the main symptom you are concerned about?" (e.g., weakness, numbness)     Weakness and tingling 2. ONSET: "When did this start?" (minutes, hours, days; while sleeping)     This am  3. LAST NORMAL: "When was the last time you were normal (no symptoms)?"     yesterday 4. PATTERN "Does this come and go, or has it been constant since it started?"  "Is it present now?"     Constant -yes  5. CARDIAC SYMPTOMS: "Have you had any of the following symptoms: chest pain, difficulty breathing, palpitations?"     no 6. NEUROLOGIC SYMPTOMS: "Have you had any of the following symptoms: headache, dizziness, vision loss, double vision, changes in speech, unsteady on your feet?"     Dizziness present all the time, tingling all over present all the time, unsteady on feet  7. OTHER SYMPTOMS: "Do you have any other symptoms?"     Lost appetite, dry mouth 8. PREGNANCY: "Is there any chance you are pregnant?" "When was your last menstrual period?"     no  Answer Assessment - Initial Assessment Questions 1. BLOOD PRESSURE: "What is the blood pressure?" "Did you take at least two measurements 5 minutes apart?"     171/96 181/83 2. ONSET: "When did you take your blood pressure?"     1320 3. HOW:  "How did you obtain the blood pressure?" (e.g., visiting nurse, automatic home BP monitor)     Auto BP cuff 4. HISTORY: "Do you have a history of high blood pressure?"     yes 5. MEDICATIONS: "Are you taking any medications for blood pressure?" "Have you missed any doses recently?"     Yes- no 6. OTHER SYMPTOMS: "Do you have any symptoms?" (e.g., headache, chest pain, blurred vision, difficulty breathing, weakness)     Tingling all over, dizziness, lightheaded 7. PREGNANCY: "Is there any chance you are pregnant?" "When was your last menstrual period?"     n/a  Protocols used: NEUROLOGIC DEFICIT-A-AH, HIGH BLOOD PRESSURE-A-AH

## 2017-05-08 NOTE — Telephone Encounter (Signed)
Copied from [id number]. Topic: Appointment Scheduling - Scheduling Inquiry for Clinic >> May 08, 2017  8:42 AM Conception Chancy, NT wrote: Patient is calling in today 05/08/17 and states she has a continous cough going on a week now that she can not get rid of. She went to the pharmacy and has been taking cough DM. She states it is helping but the cough has not gone away yet. I informed the patient the office was full today but I could offer another [place] office. Patient refused another office at this time and states she has a appt on 3/25 and would like to know if Dr. Nicki Reaper thinks she can wait that long or If she needs to be seen sooner. Please contact patient.

## 2017-05-08 NOTE — Telephone Encounter (Signed)
Patient stated she went to [facility] but wait was too long. Patient stated she is feeling better now. Advised patient that you recommended she be evaluated. Also advised that if she was not going to be evaluated she should monitor pressures closely and go for evaluation if symptoms come back.

## 2017-05-08 NOTE — Telephone Encounter (Signed)
See other phone note. Patient was advised to go to Urgent care.

## 2017-05-08 NOTE — Telephone Encounter (Signed)
Agree with evaluation.  Needs to go ahead and be seen today.

## 2017-05-08 NOTE — Telephone Encounter (Signed)
fyi

## 2017-05-09 ENCOUNTER — Emergency Department
Admission: EM | Admit: 2017-05-09 | Discharge: 2017-05-09 | Disposition: A | Discharge status: Home / Self Care | Payer: PPO | Attending: Emergency Medicine | Admitting: Emergency Medicine

## 2017-05-09 ENCOUNTER — Other Ambulatory Visit: Payer: Self-pay

## 2017-05-09 ENCOUNTER — Encounter: Payer: Self-pay | Admitting: Emergency Medicine

## 2017-05-09 DIAGNOSIS — R6889 Other general symptoms and signs: Secondary | ICD-10-CM | POA: Diagnosis not present

## 2017-05-09 DIAGNOSIS — Z7982 Long term (current) use of aspirin: Secondary | ICD-10-CM | POA: Insufficient documentation

## 2017-05-09 DIAGNOSIS — R5383 Other fatigue: Secondary | ICD-10-CM | POA: Diagnosis not present

## 2017-05-09 DIAGNOSIS — E860 Dehydration: Secondary | ICD-10-CM | POA: Diagnosis not present

## 2017-05-09 DIAGNOSIS — R799 Abnormal finding of blood chemistry, unspecified: Secondary | ICD-10-CM | POA: Diagnosis present

## 2017-05-09 DIAGNOSIS — E871 Hypo-osmolality and hyponatremia: Secondary | ICD-10-CM | POA: Diagnosis not present

## 2017-05-09 DIAGNOSIS — N189 Chronic kidney disease, unspecified: Secondary | ICD-10-CM | POA: Diagnosis not present

## 2017-05-09 DIAGNOSIS — R05 Cough: Secondary | ICD-10-CM | POA: Diagnosis not present

## 2017-05-09 DIAGNOSIS — Z79899 Other long term (current) drug therapy: Secondary | ICD-10-CM | POA: Diagnosis not present

## 2017-05-09 DIAGNOSIS — E878 Other disorders of electrolyte and fluid balance, not elsewhere classified: Secondary | ICD-10-CM | POA: Diagnosis not present

## 2017-05-09 DIAGNOSIS — I129 Hypertensive chronic kidney disease with stage 1 through stage 4 chronic kidney disease, or unspecified chronic kidney disease: Secondary | ICD-10-CM | POA: Diagnosis not present

## 2017-05-09 DIAGNOSIS — R531 Weakness: Secondary | ICD-10-CM | POA: Diagnosis not present

## 2017-05-09 LAB — BASIC METABOLIC PANEL
ANION GAP: 12 (ref 5–15)
Anion gap: 11 (ref 5–15)
BUN: 24 mg/dL — ABNORMAL HIGH (ref 6–20)
BUN: 28 mg/dL — AB (ref 6–20)
CHLORIDE: 96 mmol/L — AB (ref 101–111)
CO2: 19 mmol/L — AB (ref 22–32)
CO2: 21 mmol/L — AB (ref 22–32)
CREATININE: 1.01 mg/dL — AB (ref 0.44–1.00)
Calcium: 9.5 mg/dL (ref 8.9–10.3)
Calcium: 8.5 mg/dL — ABNORMAL LOW (ref 8.9–10.3)
Chloride: 98 mmol/L — ABNORMAL LOW (ref 101–111)
Creatinine, Ser: 0.96 mg/dL (ref 0.44–1.00)
GFR calc Af Amer: >60 mL/min (ref >60)
GFR calc Af Amer: >60 mL/min (ref >60)
GFR calc non Af Amer: 55 mL/min — ABNORMAL LOW (ref >60)
GFR calc non Af Amer: 52 mL/min — ABNORMAL LOW (ref >60)
GLUCOSE: 109 mg/dL — AB (ref 65–99)
Glucose, Bld: 157 mg/dL — ABNORMAL HIGH (ref 65–99)
POTASSIUM: 4.3 mmol/L (ref 3.5–5.1)
Potassium: 3.9 mmol/L (ref 3.5–5.1)
Sodium: 127 mmol/L — ABNORMAL LOW (ref 135–145)
Sodium: 130 mmol/L — ABNORMAL LOW (ref 135–145)

## 2017-05-09 LAB — CBC
HEMATOCRIT: 40.7 % (ref 35.0–47.0)
Hemoglobin: 14 g/dL (ref 12.0–16.0)
MCH: 29.6 pg (ref 26.0–34.0)
MCHC: 34.4 g/dL (ref 32.0–36.0)
MCV: 86 fL (ref 80.0–100.0)
Platelets: 300 10*3/uL (ref 150–440)
RBC: 4.73 MIL/uL (ref 3.80–5.20)
RDW: 14.3 % (ref 11.5–14.5)
WBC: 14.6 10*3/uL — ABNORMAL HIGH (ref 3.6–11.0)

## 2017-05-09 MED ORDER — SODIUM CHLORIDE 0.9 % IV BOLUS (SEPSIS)
1000.0000 mL | Freq: Once | INTRAVENOUS | Status: AC
  Administered 2017-05-09: 1000 mL via INTRAVENOUS

## 2017-05-09 NOTE — ED Notes (Signed)
Topez not working 

## 2017-05-09 NOTE — Discharge Instructions (Signed)
Please seek medical attention for any high fevers, chest pain, shortness of breath, change in behavior, persistent vomiting, bloody stool or any other new or concerning symptoms.  

## 2017-05-09 NOTE — ED Provider Notes (Signed)
[facility] Emergency Department Provider Note   ____________________________________________   I have reviewed the triage vital signs and the nursing notes.   HISTORY  Chief Complaint Abnormal Lab; Fatigue; and Nausea   History limited by: Not Limited   HPI Cathy Vazquez is a 77 y.o. female who presents to the emergency department today because of concern for abnormal blood work noted at walk in clinic, specifically low na and chloride. The patient's main complaint has been weakness, nausea and loss of appetite. This has been going on for the past few days. She has had generalized weakness. She denies any measured fevers. No significant abdominal pain.    Per medical record review patient has a history of diverticulosis, gerd.   Past Medical History:  Diagnosis Date  . Anemia   . Diverticulosis   . GERD (gastroesophageal reflux disease)   . Glaucoma   . Hypercholesterolemia   . Hypertension   . Thyroid nodule     Patient Active Problem List   Diagnosis Date Noted  . CKD (chronic kidney disease) 09/03/2016  . Low back pain 05/03/2015  . Vaginitis 04/12/2015  . Hyperglycemia 12/14/2014  . Health care maintenance 08/11/2014  . Lesion of skin of cheek 11/17/2013  . Knee pain 08/04/2013  . Right leg pain 03/31/2013  . Hypertension 02/21/2012  . Hypercholesterolemia 02/21/2012  . Multiple thyroid nodules 02/21/2012    Past Surgical History:  Procedure Laterality Date  . ABDOMINAL HYSTERECTOMY  1987   fibroids, ovaries not removed  . TONSILLECTOMY      Prior to Admission medications   Medication Sig Start Date End Date Taking? Authorizing Provider  acetaminophen (TYLENOL) 325 MG tablet Take 650 mg by mouth every 4 (four) hours as needed.    [provider]  aspirin 81 MG tablet Take 81 mg by mouth daily.    [provider]  diclofenac sodium (VOLTAREN) 1 % GEL diclofenac 1 % topical gel    [provider]   fish oil-omega-3 fatty acids 1000 MG capsule Take 1 g by mouth 3 (three) times daily.    [provider]  losartan (COZAAR) 50 MG tablet TAKE 1 TABLET BY MOUTH TWICE A DAY 04/17/17   Einar Pheasant, MD  meloxicam (MOBIC) 7.5 MG tablet Take 7.5 mg by mouth daily as needed for pain.    [provider]  Multiple Vitamin (MULTIVITAMIN) tablet Take 1 tablet by mouth daily.    [provider]  niacin 500 MG tablet Reported on 09/01/2015    [provider]  spironolactone (ALDACTONE) 25 MG tablet Take 1 tablet (25 mg total) by mouth daily. 04/13/17   Einar Pheasant, MD  Turmeric 500 MG TABS Take 500 mg by mouth 3 (three) times daily.    [provider]    Allergies Norvasc [amlodipine besylate]; Contrast media [iodinated diagnostic agents]; Benicar [olmesartan]; Codeine; Diclofenac; Felodipine; Lipitor [atorvastatin]; Stay awake [caffeine]; and Zocor [simvastatin]  Family History  Problem Relation Age of Onset  . Diabetes Father   . Hypertension Father   . Hypercholesterolemia Father   . Heart disease Father        CABG  . Hypertension Mother   . Hypercholesterolemia Mother   . Rheumatic fever Mother   . Hypertension Brother   . Diabetes Sister   . Breast cancer Maternal Aunt   . Breast cancer Unknown        cousin  . Colon cancer Neg Hx     Social History  Social History   Tobacco Use  . Smoking status: Never Smoker  . Smokeless tobacco: Never Used  Substance Use Topics  . Alcohol use: No    Alcohol/week: 0.0 oz  . Drug use: No    Review of Systems Constitutional: No fever/chills. Positive for generalized weakness. Eyes: No visual changes. ENT: No sore throat. Cardiovascular: Denies chest pain. Respiratory: Denies shortness of breath. Positive for cough. Gastrointestinal: No abdominal pain.  Positive for nausea.  Genitourinary: Negative for dysuria. Musculoskeletal: Negative for back pain. Skin: Negative for  rash. Neurological: Negative for headaches, focal weakness or numbness.  ____________________________________________   PHYSICAL EXAM:  VITAL SIGNS: ED Triage Vitals  Enc Vitals Group     BP 05/09/17 1503 (!) 153/68     Pulse Rate 05/09/17 1503 97     Resp 05/09/17 1503 16     Temp 05/09/17 1503 98.9 F (37.2 C)     Temp Source 05/09/17 1503 Oral     SpO2 05/09/17 1503 97 %     Weight 05/09/17 1504 167 lb (75.8 kg)     Height 05/09/17 1504 5\' 3"  (1.6 m)     Head Circumference --      Peak Flow --      Pain Score 05/09/17 1504 0   Constitutional: Alert and oriented. Well appearing and in no distress. Eyes: Conjunctivae are normal.  ENT   Head: Normocephalic and atraumatic.   Nose: No congestion/rhinnorhea.   Mouth/Throat: Mucous membranes are moist.   Neck: No stridor. Hematological/Lymphatic/Immunilogical: No cervical lymphadenopathy. Cardiovascular: Normal rate, regular rhythm.  No murmurs, rubs, or gallops.  Respiratory: Normal respiratory effort without tachypnea nor retractions. Breath sounds are clear and equal bilaterally. No wheezes/rales/rhonchi. Gastrointestinal: Soft and non tender. No rebound. No guarding.  Genitourinary: Deferred Musculoskeletal: Normal range of motion in all extremities. No lower extremity edema. Neurologic:  Normal speech and language. No gross focal neurologic deficits are appreciated.  Skin:  Skin is warm, dry and intact. No rash noted. Psychiatric: Mood and affect are normal. Speech and behavior are normal. Patient exhibits appropriate insight and judgment.  ____________________________________________    LABS (pertinent positives/negatives)  CBC wbc 14.6, hgb 14.0, plt 300 BMP na 127, glu 109, bun 24, cr 0.96  ____________________________________________   EKG  I, Nance Pear, attending physician, personally viewed and interpreted this EKG  EKG Time: 1507 Rate: 92 Rhythm: normal sinus rhythm Axis: left axis  deviation Intervals: qtc 472 QRS: narrow ST changes: no st elevation Impression: abnormal ekg   ____________________________________________    RADIOLOGY  None  ____________________________________________   PROCEDURES  Procedures  ____________________________________________   INITIAL IMPRESSION / ASSESSMENT AND PLAN / ED COURSE  Pertinent labs & imaging results that were available during my care of the patient were reviewed by me and considered in my medical decision making (see chart for details).  Patient sent from walk in clinic due to abnormal lab values. The abnormal lab values are consistent with dehydration which is likely secondary to patients symptoms of nausea decreased appetite. Patient was given IV fluids and did feel better. Repeat na did show improvement. Discussed with patient importance of continued hydration. Discussed return precautions.   ____________________________________________   FINAL CLINICAL IMPRESSION(S) / ED DIAGNOSES  Final diagnoses:  Dehydration  Hyponatremia     Note: This dictation was prepared with Dragon dictation. Any transcriptional errors that result from this process are unintentional     Nance Pear, MD 05/10/17 1610

## 2017-05-09 NOTE — ED Triage Notes (Signed)
Pt reports that she was brought over from Walk in clinic for nausea, weakness and loss of appetite for the last three days. She is also complaining of a dry cough.

## 2017-05-09 NOTE — Telephone Encounter (Signed)
Attempted to reach patient. No voicemail set up to leave message

## 2017-05-15 ENCOUNTER — Ambulatory Visit (INDEPENDENT_AMBULATORY_CARE_PROVIDER_SITE_OTHER): Payer: PPO | Admitting: Internal Medicine

## 2017-05-15 ENCOUNTER — Encounter: Payer: Self-pay | Admitting: Internal Medicine

## 2017-05-15 VITALS — BP 144/72 | HR 77 | Temp 97.8°F | Resp 18 | Ht 63.0 in | Wt 164.2 lb

## 2017-05-15 DIAGNOSIS — N183 Chronic kidney disease, stage 3 unspecified: Secondary | ICD-10-CM

## 2017-05-15 DIAGNOSIS — Z1231 Encounter for screening mammogram for malignant neoplasm of breast: Secondary | ICD-10-CM | POA: Diagnosis not present

## 2017-05-15 DIAGNOSIS — I10 Essential (primary) hypertension: Secondary | ICD-10-CM | POA: Diagnosis not present

## 2017-05-15 DIAGNOSIS — R739 Hyperglycemia, unspecified: Secondary | ICD-10-CM | POA: Diagnosis not present

## 2017-05-15 DIAGNOSIS — E7800 Pure hypercholesterolemia, unspecified: Secondary | ICD-10-CM | POA: Diagnosis not present

## 2017-05-15 DIAGNOSIS — Z1239 Encounter for other screening for malignant neoplasm of breast: Secondary | ICD-10-CM

## 2017-05-15 DIAGNOSIS — R531 Weakness: Secondary | ICD-10-CM

## 2017-05-15 LAB — CBC WITH DIFFERENTIAL/PLATELET
BASOS ABS: 0.1 10*3/uL (ref 0.0–0.1)
Basophils Relative: 0.9 % (ref 0.0–3.0)
EOS ABS: 0.1 10*3/uL (ref 0.0–0.7)
Eosinophils Relative: 0.9 % (ref 0.0–5.0)
HEMATOCRIT: 39.6 % (ref 36.0–46.0)
HEMOGLOBIN: 13.3 g/dL (ref 12.0–15.0)
LYMPHS PCT: 18.8 % (ref 12.0–46.0)
Lymphs Abs: 2.6 10*3/uL (ref 0.7–4.0)
MCHC: 33.6 g/dL (ref 30.0–36.0)
MCV: 91.3 fl (ref 78.0–100.0)
MONOS PCT: 5.8 % (ref 3.0–12.0)
Monocytes Absolute: 0.8 10*3/uL (ref 0.1–1.0)
NEUTROS ABS: 10.3 10*3/uL — AB (ref 1.4–7.7)
Neutrophils Relative %: 73.6 % (ref 43.0–77.0)
Platelets: 302 10*3/uL (ref 150.0–400.0)
RBC: 4.33 Mil/uL (ref 3.87–5.11)
RDW: 14 % (ref 11.5–15.5)
WBC: 14 10*3/uL — AB (ref 4.0–10.5)

## 2017-05-15 LAB — TSH: TSH: 1.75 u[IU]/mL (ref 0.35–4.50)

## 2017-05-15 LAB — LIPID PANEL
CHOL/HDL RATIO: 4
Cholesterol: 181 mg/dL (ref 0–200)
HDL: 45.9 mg/dL (ref >39.00)
LDL CALC: 107 mg/dL — AB (ref 0–99)
NONHDL: 135.59
Triglycerides: 144 mg/dL (ref 0.0–149.0)
VLDL: 28.8 mg/dL (ref 0.0–40.0)

## 2017-05-15 LAB — HEPATIC FUNCTION PANEL
ALK PHOS: 67 U/L (ref 39–117)
ALT: 18 U/L (ref 0–35)
AST: 18 U/L (ref 0–37)
Albumin: 4.2 g/dL (ref 3.5–5.2)
BILIRUBIN DIRECT: 0.2 mg/dL (ref 0.0–0.3)
BILIRUBIN TOTAL: 0.8 mg/dL (ref 0.2–1.2)
TOTAL PROTEIN: 7.7 g/dL (ref 6.0–8.3)

## 2017-05-15 LAB — BASIC METABOLIC PANEL
BUN: 18 mg/dL (ref 6–23)
CALCIUM: 9.5 mg/dL (ref 8.4–10.5)
CO2: 25 mEq/L (ref 19–32)
Chloride: 92 mEq/L — ABNORMAL LOW (ref 96–112)
Creatinine, Ser: 0.83 mg/dL (ref 0.40–1.20)
GFR: 70.53 mL/min (ref >60.00)
GLUCOSE: 107 mg/dL — AB (ref 70–99)
POTASSIUM: 4.5 meq/L (ref 3.5–5.1)
Sodium: 126 mEq/L — ABNORMAL LOW (ref 135–145)

## 2017-05-15 LAB — HEMOGLOBIN A1C: Hgb A1c MFr Bld: 5.9 % (ref 4.6–6.5)

## 2017-05-15 NOTE — Progress Notes (Signed)
Patient ID: Cathy Vazquez, female   DOB: November 10, 1940, 77 y.o.   MRN: [id number]   Subjective:    Patient ID: Cathy Vazquez, female    DOB: 1940/12/06, 77 y.o.   MRN: [id number]  HPI  Patient with past history of hypercholesterolemia and hypertension.  She comes in today to follow up on these issues as well as for a complete physical exam.  Was evaluated in the ER 05/09/17 with weakness, nausea and decreased appetite.  Also had some cough.  Found to have low sodium.  Was given fluids and told to stay hydrated.  She has been eating.  No nausea or vomiting.  Will have intermittent weak spells.  No syncope or near syncope.  No chest pain.  No sob.  No acid reflux.  States her blood pressure has been averaging 120-130s/70s.  Does feel better.    Past Medical History:  Diagnosis Date  . Anemia   . Diverticulosis   . GERD (gastroesophageal reflux disease)   . Glaucoma   . Hypercholesterolemia   . Hypertension   . Thyroid nodule    Past Surgical History:  Procedure Laterality Date  . ABDOMINAL HYSTERECTOMY  1987   fibroids, ovaries not removed  . TONSILLECTOMY     Family History  Problem Relation Age of Onset  . Diabetes Father   . Hypertension Father   . Hypercholesterolemia Father   . Heart disease Father        CABG  . Hypertension Mother   . Hypercholesterolemia Mother   . Rheumatic fever Mother   . Hypertension Brother   . Diabetes Sister   . Breast cancer Maternal Aunt   . Breast cancer Unknown        cousin  . Colon cancer Neg Hx    Social History   Socioeconomic History  . Marital status: Divorced    Spouse name: Not on file  . Number of children: 2  . Years of education: Not on file  . Highest education level: Not on file  Occupational History  . Not on file  Social Needs  . Financial resource strain: Not hard at all  . Food insecurity:    Worry: Never true    Inability: Never true  . Transportation needs:    Medical: No    Non-medical: No    Tobacco Use  . Smoking status: Never Smoker  . Smokeless tobacco: Never Used  Substance and Sexual Activity  . Alcohol use: No    Alcohol/week: 0.0 oz  . Drug use: No  . Sexual activity: Not Currently  Lifestyle  . Physical activity:    Days per week: Not on file    Minutes per session: Not on file  . Stress: Not on file  Relationships  . Social connections:    Talks on phone: Not on file    Gets together: Not on file    Attends religious service: Not on file    Active member of club or organization: Not on file    Attends meetings of clubs or organizations: Not on file    Relationship status: Not on file  Other Topics Concern  . Not on file  Social History Narrative  . Not on file    Outpatient Encounter Medications as of 05/15/2017  Medication Sig  . acetaminophen (TYLENOL) 325 MG tablet Take 650 mg by mouth every 4 (four) hours as needed.  Marland Kitchen aspirin 81 MG tablet Take 81 mg by mouth daily.  . diclofenac  sodium (VOLTAREN) 1 % GEL diclofenac 1 % topical gel  . fish oil-omega-3 fatty acids 1000 MG capsule Take 1 g by mouth 3 (three) times daily.  . Multiple Vitamin (MULTIVITAMIN) tablet Take 1 tablet by mouth daily.  Marland Kitchen spironolactone (ALDACTONE) 25 MG tablet Take 1 tablet (25 mg total) by mouth daily.  . Turmeric 500 MG TABS Take 500 mg by mouth 3 (three) times daily.  . [DISCONTINUED] losartan (COZAAR) 50 MG tablet TAKE 1 TABLET BY MOUTH TWICE A DAY  . [DISCONTINUED] meloxicam (MOBIC) 7.5 MG tablet Take 7.5 mg by mouth daily as needed for pain.  . [DISCONTINUED] niacin 500 MG tablet Reported on 09/01/2015   No facility-administered encounter medications on file as of 05/15/2017.     Review of Systems  Constitutional: Negative for appetite change and unexpected weight change.  HENT: Negative for congestion and sinus pressure.   Eyes: Negative for pain and visual disturbance.  Respiratory: Negative for cough, chest tightness and shortness of breath.   Cardiovascular:  Negative for chest pain, palpitations and leg swelling.  Gastrointestinal: Negative for abdominal pain, diarrhea, nausea and vomiting.  Genitourinary: Negative for difficulty urinating and dysuria.  Musculoskeletal: Negative for joint swelling and myalgias.  Skin: Negative for color change and rash.  Neurological: Negative for dizziness, light-headedness and headaches.  Hematological: Negative for adenopathy. Does not bruise/bleed easily.  Psychiatric/Behavioral: Negative for agitation and dysphoric mood.       Objective:     Blood pressure rechecked by me:  144/72  Physical Exam  Constitutional: She appears well-developed and well-nourished. No distress.  HENT:  Nose: Nose normal.  Mouth/Throat: Oropharynx is clear and moist.  Neck: Neck supple. No thyromegaly present.  Cardiovascular: Normal rate and regular rhythm.  Pulmonary/Chest: Breath sounds normal. No respiratory distress. She has no wheezes.  Abdominal: Soft. Bowel sounds are normal. There is no tenderness.  Musculoskeletal: She exhibits no edema or tenderness.  Lymphadenopathy:    She has no cervical adenopathy.  Skin: No rash noted. No erythema.  Psychiatric: She has a normal mood and affect. Her behavior is normal.    BP (!) 144/72   Pulse 77   Temp 97.8 F (36.6 C) (Oral)   Resp 18   Ht _0  (1.6 m)   Wt 164 lb 3.2 oz (74.5 kg)   LMP 02/21/1984   SpO2 98%   BMI 29.09 kg/m  Wt Readings from Last 3 Encounters:  05/15/17 164 lb 3.2 oz (74.5 kg)  05/09/17 167 lb (75.8 kg)  04/26/17 167 lb 1.9 oz (75.8 kg)     Lab Results  Component Value Date   WBC 14.0 (H) 05/15/2017   HGB 13.3 05/15/2017   HCT 39.6 05/15/2017   PLT 302.0 05/15/2017   GLUCOSE 107 (H) 05/15/2017   CHOL 181 05/15/2017   TRIG 144.0 05/15/2017   HDL 45.90 05/15/2017   LDLDIRECT 163.4 10/25/2012   LDLCALC 107 (H) 05/15/2017   ALT 18 05/15/2017   AST 18 05/15/2017   NA 130 (L) 05/16/2017   K 4.5 05/15/2017   CL 92 (L) 05/15/2017     CREATININE 0.83 05/15/2017   BUN 18 05/15/2017   CO2 25 05/15/2017   TSH 1.75 05/15/2017   HGBA1C 5.9 05/15/2017       Assessment & Plan:   Problem List Items Addressed This Visit    CKD (chronic kidney disease)    Avoid antiinflammatories.  Followed by nephrology.  Follow metabolic panel.  Relevant Orders   Basic metabolic panel (Completed)   Hypercholesterolemia    She declines cholesterol medication.  Low cholesterol diet and exercise.  Follow lipid panel.        Relevant Orders   Hepatic function panel (Completed)   Lipid panel (Completed)   Hyperglycemia    Low carb diet and exercise.  Follow met b and a1c.        Relevant Orders   Hemoglobin A1c (Completed)   Hypertension    Blood pressure as outlined.  Outside checks better.  Continue current medication regimen.  Follow pressures.        Relevant Orders   CBC with Differential/Platelet (Completed)   TSH (Completed)   Weakness    Overall feels better.  Occasional brief weak episodes.  Discussed with her today.  Discussed further w/up.  She declines.  Wants to monitor.  She is eating.  Recheck metabolic panel.        Other Visit Diagnoses    Breast cancer screening    -  Primary       Einar Pheasant, MD

## 2017-05-15 NOTE — Telephone Encounter (Signed)
Patient is coming in today for appointment with PCP

## 2017-05-16 ENCOUNTER — Telehealth: Payer: Self-pay | Admitting: Internal Medicine

## 2017-05-16 ENCOUNTER — Telehealth: Payer: Self-pay

## 2017-05-16 ENCOUNTER — Other Ambulatory Visit: Payer: Self-pay | Admitting: Internal Medicine

## 2017-05-16 ENCOUNTER — Other Ambulatory Visit (INDEPENDENT_AMBULATORY_CARE_PROVIDER_SITE_OTHER): Payer: PPO

## 2017-05-16 DIAGNOSIS — E871 Hypo-osmolality and hyponatremia: Secondary | ICD-10-CM

## 2017-05-16 LAB — SODIUM: Sodium: 130 mEq/L — ABNORMAL LOW (ref 135–145)

## 2017-05-16 NOTE — Telephone Encounter (Signed)
Agree.  If any recurrence or persistent issues, will need to be reevaluated.  Sodium better.  Continue increased po intake.  We will follow as outlined in result note.

## 2017-05-16 NOTE — Telephone Encounter (Signed)
PT was in for labs, she wanted Dr. Nicki Reaper to know she is weak, thirsty, and a little dizzie. She did cut back on her fluids per Dr. Nicki Reaper and ate some more. Please advise.

## 2017-05-16 NOTE — Telephone Encounter (Signed)
Called patient to give labs, asked how she was feeling. Patient stated that she felt great. She had a "spell" this morning when she came to have blood drawn but is ok now. Advised patient that she should be evaluated if symptoms become persistent.

## 2017-05-16 NOTE — Progress Notes (Signed)
Order placed for f/u sodium check.   

## 2017-05-16 NOTE — Progress Notes (Signed)
Order placed for f/u sodium.  ?

## 2017-05-16 NOTE — Telephone Encounter (Signed)
-----   Message from Einar Pheasant, MD sent at 05/16/2017  4:00 AM EDT ----- Regarding: schedule lab I notified pt of her lab results.  She is coming in today around 10:30 or 11:00 for f/u sodium check.  She is aware of appt.  Please place on lab schedule.  Thanks    Dr Nicki Reaper

## 2017-05-16 NOTE — Telephone Encounter (Signed)
Patient placed on the lab schedule as requested.

## 2017-05-17 ENCOUNTER — Other Ambulatory Visit: Payer: Self-pay | Admitting: Internal Medicine

## 2017-05-20 ENCOUNTER — Encounter: Payer: Self-pay | Admitting: Internal Medicine

## 2017-05-20 DIAGNOSIS — R531 Weakness: Secondary | ICD-10-CM | POA: Insufficient documentation

## 2017-05-20 NOTE — Assessment & Plan Note (Addendum)
Overall feels better.  Occasional brief weak episodes.  Discussed with her today.  Discussed further w/up.  She declines.  Wants to monitor.  She is eating.  Recheck metabolic panel.

## 2017-05-20 NOTE — Assessment & Plan Note (Signed)
Low carb diet and exercise.  Follow met b and a1c.   

## 2017-05-20 NOTE — Assessment & Plan Note (Signed)
She declines cholesterol medication.  Low cholesterol diet and exercise.  Follow lipid panel.   

## 2017-05-20 NOTE — Assessment & Plan Note (Signed)
Blood pressure as outlined.  Outside checks better.  Continue current medication regimen.  Follow pressures.

## 2017-05-20 NOTE — Assessment & Plan Note (Signed)
Avoid antiinflammatories.  Followed by nephrology.  Follow metabolic panel.  

## 2017-05-25 ENCOUNTER — Other Ambulatory Visit (INDEPENDENT_AMBULATORY_CARE_PROVIDER_SITE_OTHER): Payer: PPO

## 2017-05-25 DIAGNOSIS — E871 Hypo-osmolality and hyponatremia: Secondary | ICD-10-CM

## 2017-05-25 LAB — SODIUM: Sodium: 134 mEq/L — ABNORMAL LOW (ref 135–145)

## 2017-05-30 ENCOUNTER — Ambulatory Visit
Admission: RE | Admit: 2017-05-30 | Discharge: 2017-05-30 | Disposition: A | Discharge status: Home / Self Care | Payer: PPO | Source: Ambulatory Visit | Attending: Internal Medicine | Admitting: Internal Medicine

## 2017-05-30 ENCOUNTER — Other Ambulatory Visit: Payer: Self-pay | Admitting: Internal Medicine

## 2017-05-30 DIAGNOSIS — Z1231 Encounter for screening mammogram for malignant neoplasm of breast: Secondary | ICD-10-CM | POA: Diagnosis not present

## 2017-05-30 DIAGNOSIS — Z1239 Encounter for other screening for malignant neoplasm of breast: Secondary | ICD-10-CM

## 2017-06-15 IMAGING — MG MM DIGITAL SCREENING BILAT W/ TOMO W/ CAD
9 of 16 series · 9 of 36 positions shown · non-contrast
Comparison: Previous exam(s).

CLINICAL DATA: Screening.

EXAM:
2D DIGITAL SCREENING BILATERAL MAMMOGRAM WITH CAD AND ADJUNCT TOMO

[R CC]
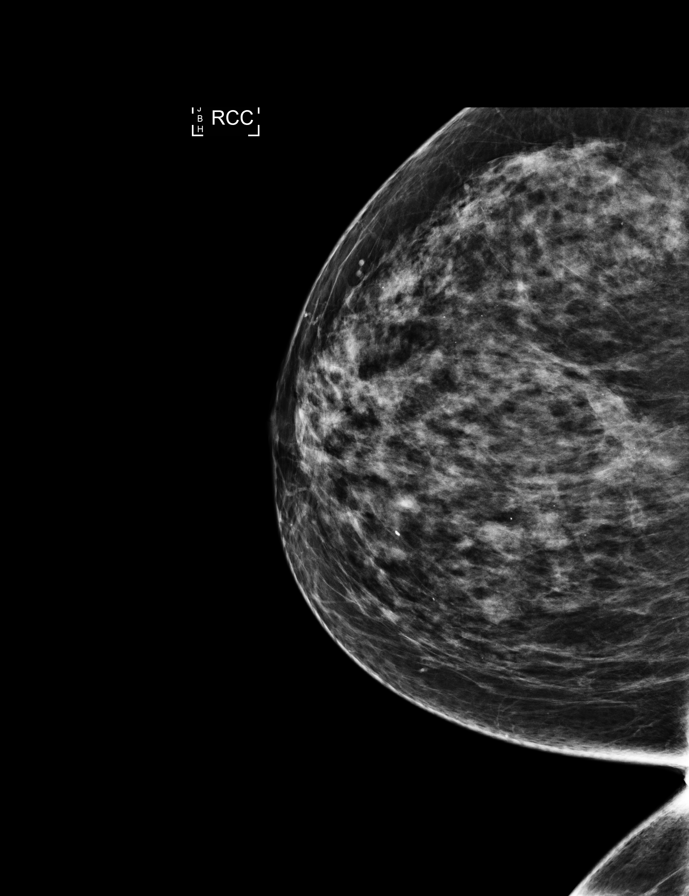

[R MLO synth-2D (1 of 2)]
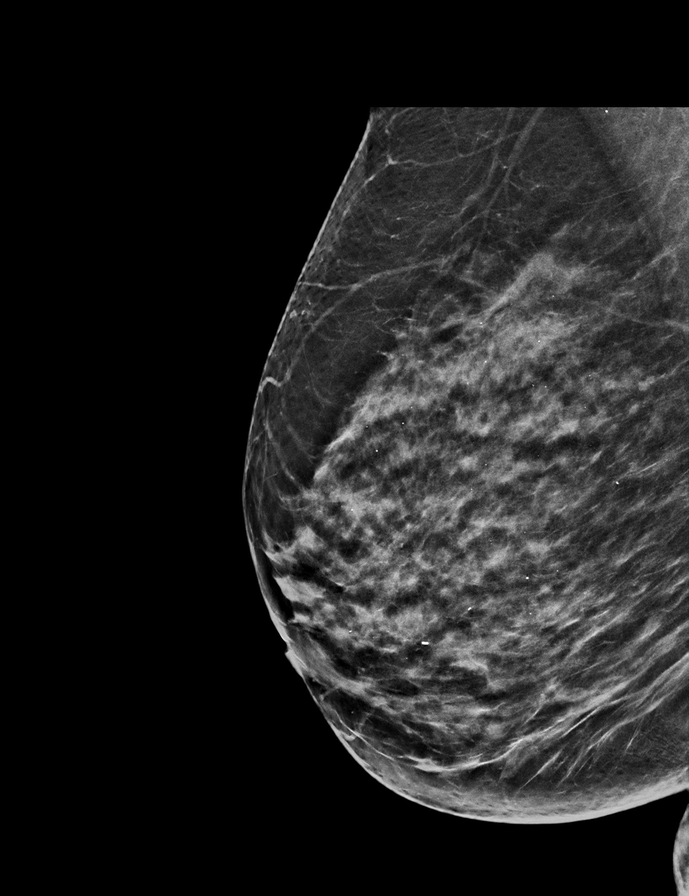

[L CC]
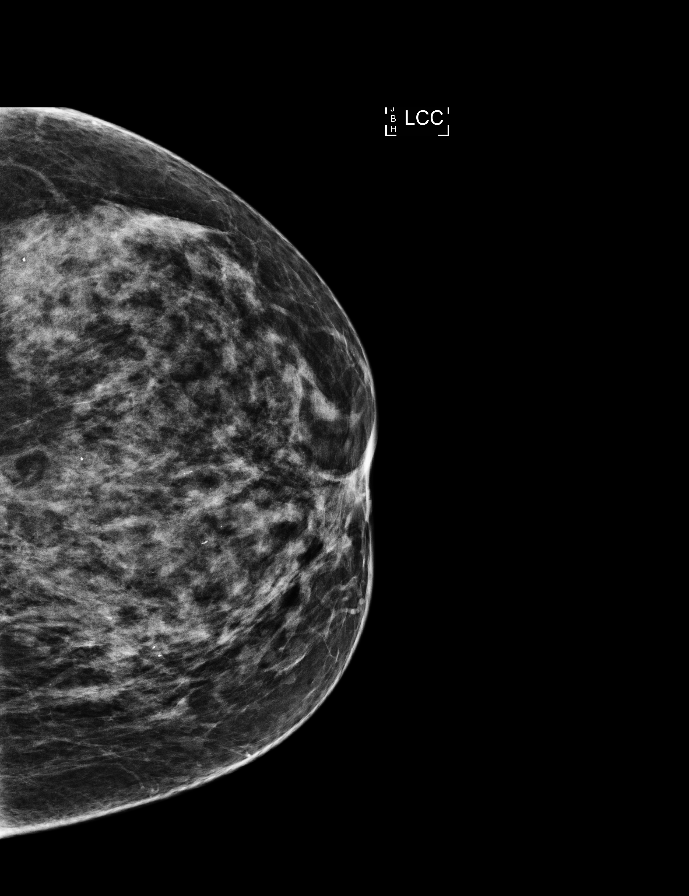

[L MLO synth-2D]
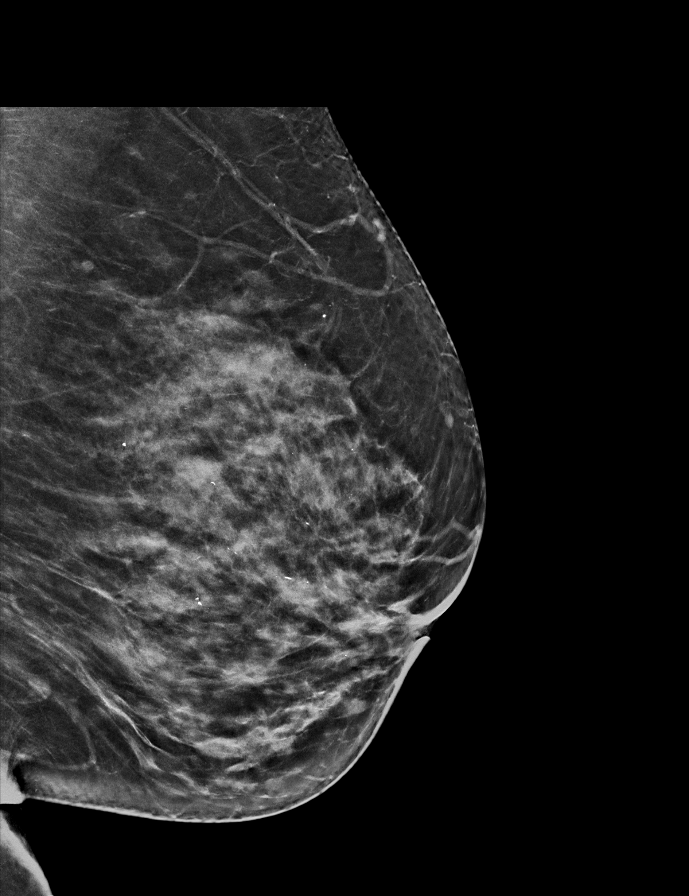

[L CC synth-2D]
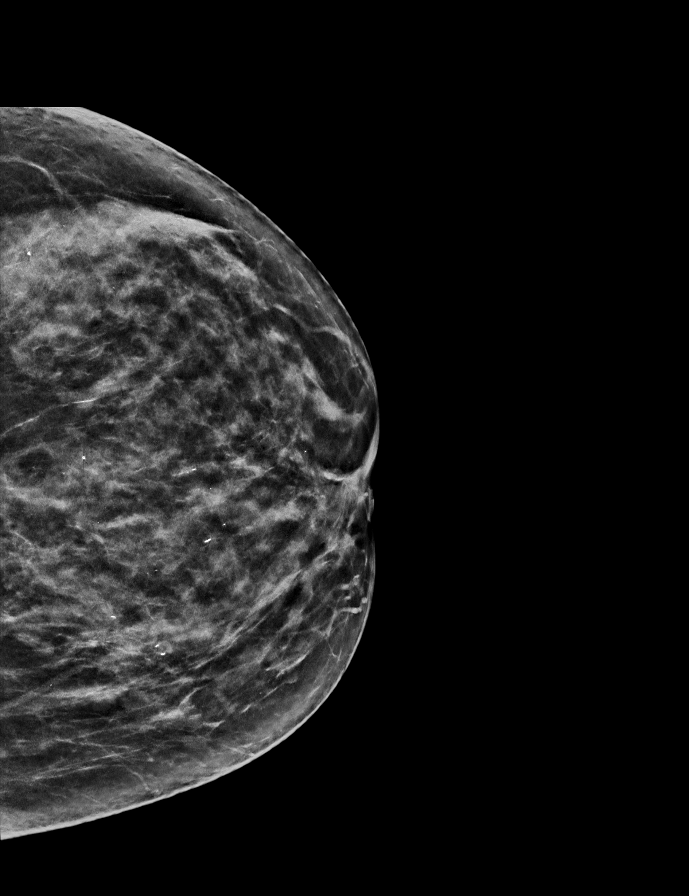

[R MLO (1 of 2)]
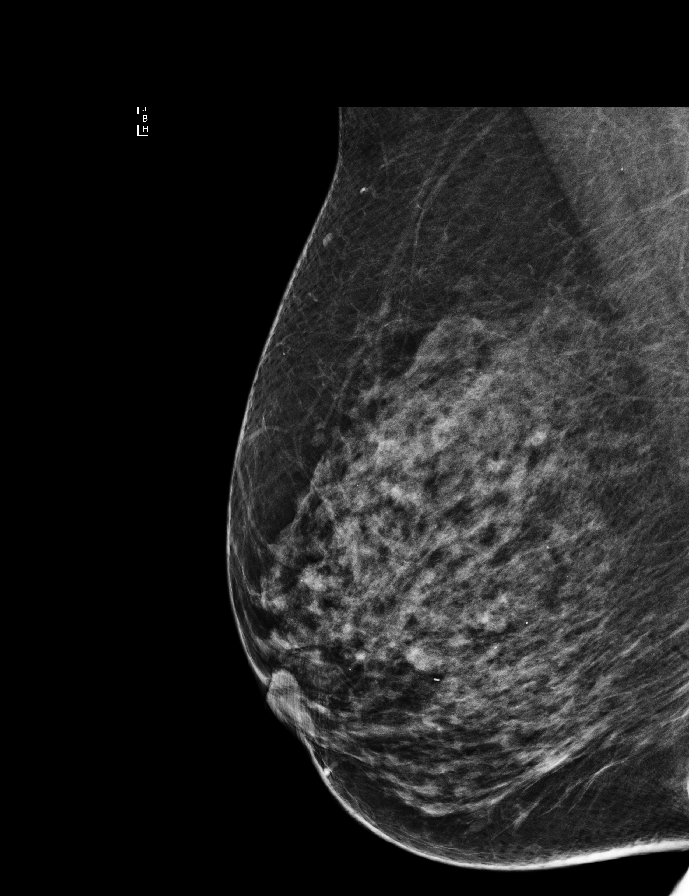

[L MLO]
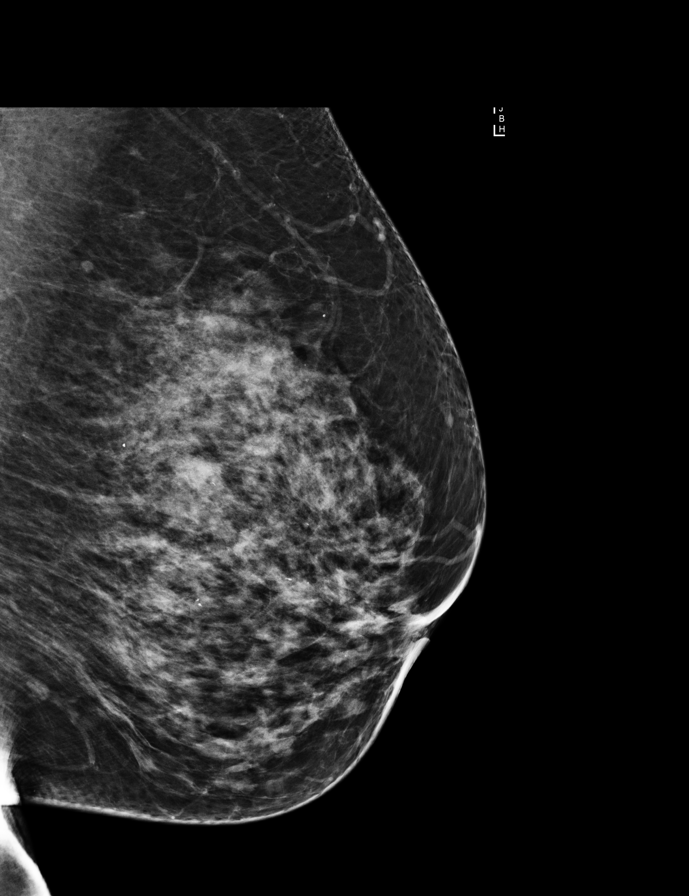

[R MLO (2 of 2)]
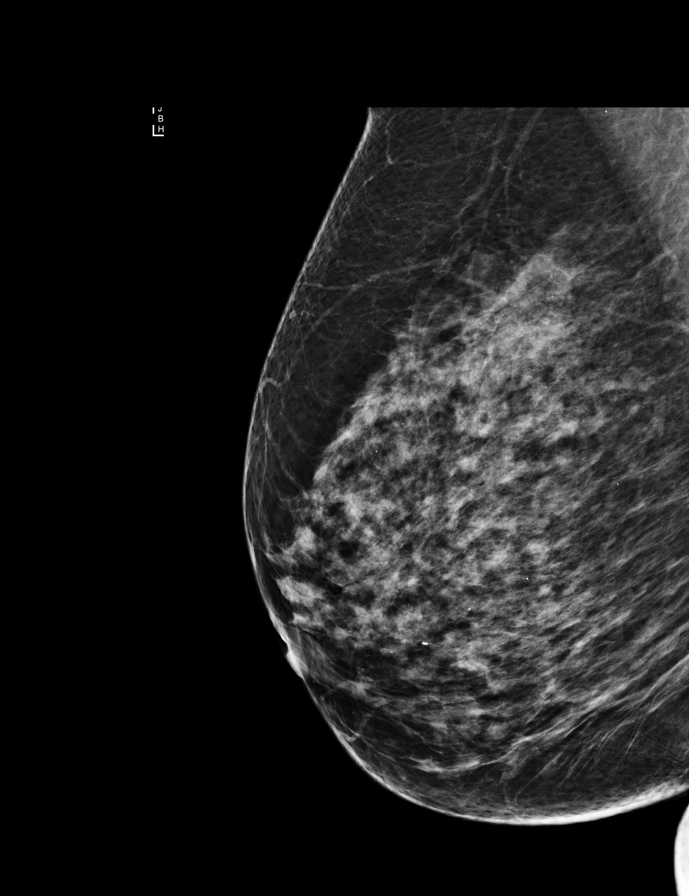

[R MLO synth-2D (2 of 2)]
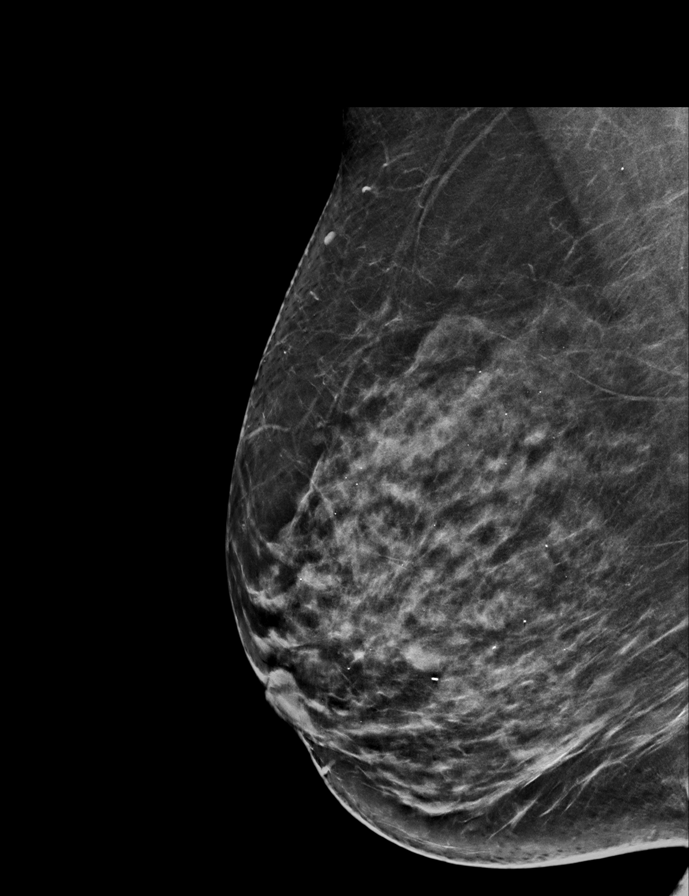

[9 of 36 positions shown; findings below may reference images not displayed]

ACR Breast Density Category d: The breast tissue is extremely dense,
which lowers the sensitivity of mammography.
FINDINGS: There are no findings suspicious for malignancy. Images were
processed with CAD.
IMPRESSION: No mammographic evidence of malignancy. A result letter of this
screening mammogram will be mailed directly to the patient.

RECOMMENDATION:
Screening mammogram in one year. (Code:US-D-RZ7)

BI-RADS CATEGORY  1: Negative.

## 2017-06-16 ENCOUNTER — Other Ambulatory Visit: Payer: Self-pay | Admitting: Internal Medicine

## 2017-07-07 ENCOUNTER — Other Ambulatory Visit: Payer: Self-pay | Admitting: Internal Medicine

## 2017-07-19 ENCOUNTER — Other Ambulatory Visit: Payer: Self-pay | Admitting: Internal Medicine

## 2017-07-24 ENCOUNTER — Encounter: Payer: Self-pay | Admitting: Internal Medicine

## 2017-07-24 ENCOUNTER — Ambulatory Visit (INDEPENDENT_AMBULATORY_CARE_PROVIDER_SITE_OTHER): Payer: PPO | Admitting: Internal Medicine

## 2017-07-24 VITALS — BP 144/80 | HR 95 | Temp 98.4°F | Resp 18 | Wt 167.2 lb

## 2017-07-24 DIAGNOSIS — M25551 Pain in right hip: Secondary | ICD-10-CM

## 2017-07-24 DIAGNOSIS — D72829 Elevated white blood cell count, unspecified: Secondary | ICD-10-CM | POA: Diagnosis not present

## 2017-07-24 DIAGNOSIS — E042 Nontoxic multinodular goiter: Secondary | ICD-10-CM | POA: Diagnosis not present

## 2017-07-24 DIAGNOSIS — I10 Essential (primary) hypertension: Secondary | ICD-10-CM | POA: Diagnosis not present

## 2017-07-24 DIAGNOSIS — N183 Chronic kidney disease, stage 3 unspecified: Secondary | ICD-10-CM

## 2017-07-24 DIAGNOSIS — E7800 Pure hypercholesterolemia, unspecified: Secondary | ICD-10-CM | POA: Diagnosis not present

## 2017-07-24 DIAGNOSIS — E871 Hypo-osmolality and hyponatremia: Secondary | ICD-10-CM | POA: Diagnosis not present

## 2017-07-24 DIAGNOSIS — R739 Hyperglycemia, unspecified: Secondary | ICD-10-CM | POA: Diagnosis not present

## 2017-07-24 LAB — CBC WITH DIFFERENTIAL/PLATELET
BASOS ABS: 0.1 10*3/uL (ref 0.0–0.1)
Basophils Relative: 0.9 % (ref 0.0–3.0)
EOS ABS: 0.2 10*3/uL (ref 0.0–0.7)
Eosinophils Relative: 1.9 % (ref 0.0–5.0)
HCT: 37.7 % (ref 36.0–46.0)
HEMOGLOBIN: 12.8 g/dL (ref 12.0–15.0)
Lymphocytes Relative: 19 % (ref 12.0–46.0)
Lymphs Abs: 1.9 10*3/uL (ref 0.7–4.0)
MCHC: 33.9 g/dL (ref 30.0–36.0)
MCV: 91.7 fl (ref 78.0–100.0)
Monocytes Absolute: 0.6 10*3/uL (ref 0.1–1.0)
Monocytes Relative: 6.1 % (ref 3.0–12.0)
Neutro Abs: 7.4 10*3/uL (ref 1.4–7.7)
Neutrophils Relative %: 72.1 % (ref 43.0–77.0)
Platelets: 278 10*3/uL (ref 150.0–400.0)
RBC: 4.12 Mil/uL (ref 3.87–5.11)
RDW: 14.8 % (ref 11.5–15.5)
WBC: 10.2 10*3/uL (ref 4.0–10.5)

## 2017-07-24 LAB — BASIC METABOLIC PANEL
BUN: 21 mg/dL (ref 6–23)
CO2: 25 meq/L (ref 19–32)
Calcium: 9.7 mg/dL (ref 8.4–10.5)
Chloride: 102 mEq/L (ref 96–112)
Creatinine, Ser: 1.19 mg/dL (ref 0.40–1.20)
GFR: 46.51 mL/min — ABNORMAL LOW (ref >60.00)
GLUCOSE: 131 mg/dL — AB (ref 70–99)
POTASSIUM: 4.5 meq/L (ref 3.5–5.1)
SODIUM: 135 meq/L (ref 135–145)

## 2017-07-24 NOTE — Progress Notes (Signed)
Patient ID: Cathy Vazquez, female   DOB: [birth year], 77 y.o.   MRN: [id number]   Subjective:    Patient ID: Cathy Vazquez, female    DOB: 1940-08-26, 77 y.o.   MRN: [id number]  HPI  Patient here for a scheduled follow up.  Here to f/u on her blood pressure.  She reports she feels good.  Feels better.  Trying to stay active.  No chest pain.  No sob.  No acid reflux.  No abdominal pain.  Bowels moving.  Taking her blood pressure medication regularly.  States averaging 130-140 on outside checks.  Having some issues with her right hip and left knee.  Scheduled to see Reche Dixon this week.  Takes meloxicam prn.  Rarely takes.  Discussed trying to avoid, given issues with her blood pressure.     Past Medical History:  Diagnosis Date  . Anemia   . Diverticulosis   . GERD (gastroesophageal reflux disease)   . Glaucoma   . Hypercholesterolemia   . Hypertension   . Thyroid nodule    Past Surgical History:  Procedure Laterality Date  . ABDOMINAL HYSTERECTOMY  1987   fibroids, ovaries not removed  . TONSILLECTOMY     Family History  Problem Relation Age of Onset  . Diabetes Father   . Hypertension Father   . Hypercholesterolemia Father   . Heart disease Father        CABG  . Hypertension Mother   . Hypercholesterolemia Mother   . Rheumatic fever Mother   . Hypertension Brother   . Diabetes Sister   . Breast cancer Maternal Aunt   . Breast cancer Unknown        cousin  . Colon cancer Neg Hx    Social History   Socioeconomic History  . Marital status: Divorced    Spouse name: Not on file  . Number of children: 2  . Years of education: Not on file  . Highest education level: Not on file  Occupational History  . Not on file  Social Needs  . Financial resource strain: Not hard at all  . Food insecurity:    Worry: Never true    Inability: Never true  . Transportation needs:    Medical: No    Non-medical: No  Tobacco Use  . Smoking status: Never Smoker  .  Smokeless tobacco: Never Used  Substance and Sexual Activity  . Alcohol use: No    Alcohol/week: 0.0 oz  . Drug use: No  . Sexual activity: Not Currently  Lifestyle  . Physical activity:    Days per week: Not on file    Minutes per session: Not on file  . Stress: Not on file  Relationships  . Social connections:    Talks on phone: Not on file    Gets together: Not on file    Attends religious service: Not on file    Active member of club or organization: Not on file    Attends meetings of clubs or organizations: Not on file    Relationship status: Not on file  Other Topics Concern  . Not on file  Social History Narrative  . Not on file    Outpatient Encounter Medications as of 07/24/2017  Medication Sig  . acetaminophen (TYLENOL) 325 MG tablet Take 650 mg by mouth every 4 (four) hours as needed.  Marland Kitchen aspirin 81 MG tablet Take 81 mg by mouth daily.  . fish oil-omega-3 fatty acids 1000 MG capsule Take 1  g by mouth 3 (three) times daily.  Marland Kitchen losartan (COZAAR) 50 MG tablet TAKE 1 TABLET BY MOUTH TWICE A DAY  . Multiple Vitamin (MULTIVITAMIN) tablet Take 1 tablet by mouth daily.  Marland Kitchen spironolactone (ALDACTONE) 25 MG tablet TAKE 1 TABLET BY MOUTH EVERY DAY  . Turmeric 500 MG TABS Take 500 mg by mouth 3 (three) times daily.  . [DISCONTINUED] diclofenac sodium (VOLTAREN) 1 % GEL diclofenac 1 % topical gel   No facility-administered encounter medications on file as of 07/24/2017.     Review of Systems  Constitutional: Negative for appetite change and unexpected weight change.  HENT: Negative for congestion and sinus pressure.   Respiratory: Negative for cough, chest tightness and shortness of breath.   Cardiovascular: Negative for chest pain, palpitations and leg swelling.  Gastrointestinal: Negative for abdominal pain, diarrhea, nausea and vomiting.  Genitourinary: Negative for difficulty urinating and dysuria.  Musculoskeletal:       Left knee pain and right hip pain as outlined.  Due  to see ortho this week.   Skin: Negative for color change and rash.  Neurological: Negative for dizziness, light-headedness and headaches.  Psychiatric/Behavioral: Negative for agitation and dysphoric mood.       Objective:     Blood pressure rechecked by me:  144/78-80  Physical Exam  Constitutional: She appears well-developed and well-nourished. No distress.  HENT:  Nose: Nose normal.  Mouth/Throat: Oropharynx is clear and moist.  Neck: Neck supple. No thyromegaly present.  Cardiovascular: Normal rate and regular rhythm.  Pulmonary/Chest: Breath sounds normal. No respiratory distress. She has no wheezes.  Abdominal: Soft. Bowel sounds are normal. There is no tenderness.  Musculoskeletal: She exhibits no edema or tenderness.  Lymphadenopathy:    She has no cervical adenopathy.  Skin: No rash noted. No erythema.  Psychiatric: She has a normal mood and affect. Her behavior is normal.    BP (!) 144/80   Pulse 95   Temp 98.4 F (36.9 C) (Oral)   Resp 18   Wt 167 lb 3.2 oz (75.8 kg)   LMP 02/21/1984   SpO2 97%   BMI 29.62 kg/m  Wt Readings from Last 3 Encounters:  07/24/17 167 lb 3.2 oz (75.8 kg)  05/15/17 164 lb 3.2 oz (74.5 kg)  05/09/17 167 lb (75.8 kg)     Lab Results  Component Value Date   WBC 10.2 07/24/2017   HGB 12.8 07/24/2017   HCT 37.7 07/24/2017   PLT 278.0 07/24/2017   GLUCOSE 131 (H) 07/24/2017   CHOL 181 05/15/2017   TRIG 144.0 05/15/2017   HDL 45.90 05/15/2017   LDLDIRECT 163.4 10/25/2012   LDLCALC 107 (H) 05/15/2017   ALT 18 05/15/2017   AST 18 05/15/2017   NA 135 07/24/2017   K 4.5 07/24/2017   CL 102 07/24/2017   CREATININE 1.19 07/24/2017   BUN 21 07/24/2017   CO2 25 07/24/2017   TSH 1.75 05/15/2017   HGBA1C 5.9 05/15/2017    Mm 3d Screen Breast Bilateral  Result Date: 05/30/2017 CLINICAL DATA:  Screening. EXAM: DIGITAL SCREENING BILATERAL MAMMOGRAM WITH TOMO AND CAD COMPARISON:  Previous exam(s). ACR Breast Density Category c: The  breast tissue is heterogeneously dense, which may obscure small masses. FINDINGS: There are no findings suspicious for malignancy. Images were processed with CAD. IMPRESSION: No mammographic evidence of malignancy. A result letter of this screening mammogram will be mailed directly to the patient. RECOMMENDATION: Screening mammogram in one year. (Code:SM-B-01Y) BI-RADS CATEGORY  1: Negative. Electronically Signed  By: Nolon Nations M.D.   On: 05/30/2017 14:31       Assessment & Plan:   Problem List Items Addressed This Visit    CKD (chronic kidney disease)    Followed by nephrology.  Discussed the need to try to avoid antiinflammatories.  Follow met b.       Hypercholesterolemia    She declines cholesterol medication.  Low cholesterol diet and exercise.  Follow lipid panel.       Hyperglycemia    Low carb diet and exercise.  Follow met b and a1c.       Hypertension - Primary    Blood pressure on recheck improved.  Her outside checks averaging 226-333 systolic.  Same medication regimen.  Follow metb.        Relevant Orders   Basic metabolic panel (Completed)   Multiple thyroid nodules    Has seen Dr Eddie Dibbles.  Follow thyroid function tests.        Other Visit Diagnoses    Hyponatremia syndrome       Relevant Orders   Basic metabolic panel (Completed)   Leukocytosis, unspecified type       Relevant Orders   CBC with Differential/Platelet (Completed)   Right hip pain       Right hip and left knee pain. Due to see ortho this week.  Follow.  Discussed avoiding antiinflammatories.         Einar Pheasant, MD

## 2017-07-25 ENCOUNTER — Other Ambulatory Visit: Payer: Self-pay | Admitting: Internal Medicine

## 2017-07-25 DIAGNOSIS — R7989 Other specified abnormal findings of blood chemistry: Secondary | ICD-10-CM

## 2017-07-25 NOTE — Progress Notes (Signed)
Order placed for f/u labs.  

## 2017-07-26 ENCOUNTER — Encounter: Payer: Self-pay | Admitting: Internal Medicine

## 2017-07-26 NOTE — Assessment & Plan Note (Signed)
Blood pressure on recheck improved.  Her outside checks averaging 241-991 systolic.  Same medication regimen.  Follow metb.

## 2017-07-26 NOTE — Assessment & Plan Note (Signed)
She declines cholesterol medication.  Low cholesterol diet and exercise.  Follow lipid panel.   

## 2017-07-26 NOTE — Assessment & Plan Note (Signed)
Has seen Dr Paul.  Follow thyroid function tests.   

## 2017-07-26 NOTE — Assessment & Plan Note (Signed)
Followed by nephrology.  Discussed the need to try to avoid antiinflammatories.  Follow met b.

## 2017-07-26 NOTE — Assessment & Plan Note (Signed)
Low carb diet and exercise.  Follow met b and a1c.  

## 2017-07-27 DIAGNOSIS — E669 Obesity, unspecified: Secondary | ICD-10-CM | POA: Diagnosis not present

## 2017-07-27 DIAGNOSIS — M9905 Segmental and somatic dysfunction of pelvic region: Secondary | ICD-10-CM | POA: Diagnosis not present

## 2017-07-27 DIAGNOSIS — M7918 Myalgia, other site: Secondary | ICD-10-CM | POA: Diagnosis not present

## 2017-07-27 DIAGNOSIS — M9903 Segmental and somatic dysfunction of lumbar region: Secondary | ICD-10-CM | POA: Diagnosis not present

## 2017-07-27 DIAGNOSIS — M5136 Other intervertebral disc degeneration, lumbar region: Secondary | ICD-10-CM | POA: Diagnosis not present

## 2017-07-27 DIAGNOSIS — M9904 Segmental and somatic dysfunction of sacral region: Secondary | ICD-10-CM | POA: Diagnosis not present

## 2017-07-27 DIAGNOSIS — M1611 Unilateral primary osteoarthritis, right hip: Secondary | ICD-10-CM | POA: Diagnosis not present

## 2017-07-27 DIAGNOSIS — M1612 Unilateral primary osteoarthritis, left hip: Secondary | ICD-10-CM | POA: Diagnosis not present

## 2017-07-27 DIAGNOSIS — M5417 Radiculopathy, lumbosacral region: Secondary | ICD-10-CM | POA: Diagnosis not present

## 2017-07-27 DIAGNOSIS — M545 Low back pain: Secondary | ICD-10-CM | POA: Diagnosis not present

## 2017-08-01 ENCOUNTER — Ambulatory Visit: Payer: Self-pay | Admitting: *Deleted

## 2017-08-01 NOTE — Telephone Encounter (Signed)
  Called in c/o feeling weak yesterday with today the weakness being worse and nausea.   No vomiting or diarrhea.   "The last time I felt like this my sodium level was low".   "I had to go to the ER and get IV fluids".   I called the flow coordinator and made her aware of the situation.   Trying to decide whether to send her to the ED or do home care or have an office visit.   Patina looked at her labs from a week ago and her sodium was within normal limits.  It was suggested she go to the urgent care or try drinking some Gatorade and see if she starts to feel better.  I let the pt decided what she wanted to do and my conversation with Patina the flow coordinator.   The pt has decided to drink some Gatorade and see if she starts to feel better.   I also went over the home care advice with her too.  I instructed her to go to the urgent care center if she doesn't start feeling better in a day or two or she starts feeling worse.    She verbalized understanding and was agreeable to this plan.     Reason for Disposition . Unexplained nausea  Answer Assessment - Initial Assessment Questions 1. NAUSEA SEVERITY: "How bad is the nausea?" (e.g., mild, moderate, severe; dehydration, weight loss)   - MILD: loss of appetite without change in eating habits   - MODERATE: decreased oral intake without significant weight loss, dehydration, or malnutrition   - SEVERE: inadequate caloric or fluid intake, significant weight loss, symptoms of dehydration     I'm just very nauseated.   No vomiting.  I'm feeling very weak also. 2. ONSET: "When did the nausea begin?"     Yesterday I was weak.   Today the nausea started.   The last time I had this my sodium was low. 3. VOMITING: "Any vomiting?" If so, ask: "How many times today?"     No vomiting. 4. RECURRENT SYMPTOM: "Have you had nausea before?" If so, ask: "When was the last time?" "What happened that time?"     Yes when my sodium was low. 5. CAUSE: "What do you  think is causing the nausea?"     My sodium being low I think. 6. PREGNANCY: "Is there any chance you are pregnant?" (e.g., unprotected intercourse, missed birth control pill, broken condom)     N/A  Protocols used: NAUSEA-A-AH

## 2017-08-14 ENCOUNTER — Other Ambulatory Visit (INDEPENDENT_AMBULATORY_CARE_PROVIDER_SITE_OTHER): Payer: PPO

## 2017-08-14 DIAGNOSIS — R7989 Other specified abnormal findings of blood chemistry: Secondary | ICD-10-CM | POA: Diagnosis not present

## 2017-08-14 LAB — URINALYSIS, ROUTINE W REFLEX MICROSCOPIC
Bilirubin Urine: NEGATIVE
KETONES UR: NEGATIVE
Nitrite: NEGATIVE
PH: 5.5 (ref 5.0–8.0)
Specific Gravity, Urine: <=1.005 — AB (ref 1.000–1.030)
TOTAL PROTEIN, URINE-UPE24: NEGATIVE
Urine Glucose: NEGATIVE
Urobilinogen, UA: 0.2 (ref 0.0–1.0)

## 2017-08-14 LAB — BASIC METABOLIC PANEL
BUN: 22 mg/dL (ref 6–23)
CHLORIDE: 100 meq/L (ref 96–112)
CO2: 25 meq/L (ref 19–32)
Calcium: 9.8 mg/dL (ref 8.4–10.5)
Creatinine, Ser: 1.38 mg/dL — ABNORMAL HIGH (ref 0.40–1.20)
GFR: 39.2 mL/min — AB (ref >60.00)
GLUCOSE: 114 mg/dL — AB (ref 70–99)
POTASSIUM: 4.8 meq/L (ref 3.5–5.1)
SODIUM: 133 meq/L — AB (ref 135–145)

## 2017-08-15 DIAGNOSIS — H40003 Preglaucoma, unspecified, bilateral: Secondary | ICD-10-CM | POA: Diagnosis not present

## 2017-08-16 ENCOUNTER — Ambulatory Visit: Payer: PPO | Admitting: Internal Medicine

## 2017-08-19 ENCOUNTER — Other Ambulatory Visit: Payer: Self-pay | Admitting: Internal Medicine

## 2017-09-01 DIAGNOSIS — I10 Essential (primary) hypertension: Secondary | ICD-10-CM | POA: Diagnosis not present

## 2017-09-01 DIAGNOSIS — N390 Urinary tract infection, site not specified: Secondary | ICD-10-CM | POA: Diagnosis not present

## 2017-09-01 DIAGNOSIS — N179 Acute kidney failure, unspecified: Secondary | ICD-10-CM | POA: Diagnosis not present

## 2017-09-04 DIAGNOSIS — N179 Acute kidney failure, unspecified: Secondary | ICD-10-CM | POA: Diagnosis not present

## 2017-09-04 DIAGNOSIS — N183 Chronic kidney disease, stage 3 (moderate): Secondary | ICD-10-CM | POA: Diagnosis not present

## 2017-09-04 DIAGNOSIS — I10 Essential (primary) hypertension: Secondary | ICD-10-CM | POA: Diagnosis not present

## 2017-09-04 DIAGNOSIS — N390 Urinary tract infection, site not specified: Secondary | ICD-10-CM | POA: Diagnosis not present

## 2017-09-06 ENCOUNTER — Other Ambulatory Visit: Payer: Self-pay | Admitting: Nephrology

## 2017-09-06 DIAGNOSIS — N179 Acute kidney failure, unspecified: Secondary | ICD-10-CM

## 2017-09-13 ENCOUNTER — Ambulatory Visit
Admission: RE | Admit: 2017-09-13 | Discharge: 2017-09-13 | Disposition: A | Discharge status: Home / Self Care | Payer: PPO | Source: Ambulatory Visit | Attending: Nephrology | Admitting: Nephrology

## 2017-09-13 DIAGNOSIS — N179 Acute kidney failure, unspecified: Secondary | ICD-10-CM | POA: Insufficient documentation

## 2017-09-13 DIAGNOSIS — N281 Cyst of kidney, acquired: Secondary | ICD-10-CM | POA: Diagnosis not present

## 2017-09-15 ENCOUNTER — Telehealth: Payer: Self-pay

## 2017-09-15 ENCOUNTER — Other Ambulatory Visit: Payer: Self-pay | Admitting: Internal Medicine

## 2017-09-15 DIAGNOSIS — R112 Nausea with vomiting, unspecified: Secondary | ICD-10-CM | POA: Diagnosis not present

## 2017-09-15 DIAGNOSIS — R5383 Other fatigue: Secondary | ICD-10-CM | POA: Diagnosis not present

## 2017-09-15 DIAGNOSIS — N390 Urinary tract infection, site not specified: Secondary | ICD-10-CM | POA: Diagnosis not present

## 2017-09-15 NOTE — Telephone Encounter (Signed)
Possible diverticulitis from what the [facility] stated. Can you please call pt

## 2017-09-15 NOTE — Telephone Encounter (Signed)
Copied from [place] [id number]. Topic: General - Other >> Sep 15, 2017  8:06 AM Carolyn Stare wrote:  Pt is requesting to see Dr Nicki Reaper next week and would like a call back as to when she can come in

## 2017-09-15 NOTE — Telephone Encounter (Signed)
Can you please triage this patient. If acute symptoms she will not need to wait until next week to see someone.

## 2017-09-15 NOTE — Telephone Encounter (Signed)
Tried to call patient unable to leave voicemail for patient due to not having a voicemail set up

## 2017-09-20 DIAGNOSIS — N390 Urinary tract infection, site not specified: Secondary | ICD-10-CM | POA: Diagnosis not present

## 2017-09-20 DIAGNOSIS — N183 Chronic kidney disease, stage 3 (moderate): Secondary | ICD-10-CM | POA: Diagnosis not present

## 2017-09-20 DIAGNOSIS — I10 Essential (primary) hypertension: Secondary | ICD-10-CM | POA: Diagnosis not present

## 2017-09-20 DIAGNOSIS — N10 Acute tubulo-interstitial nephritis: Secondary | ICD-10-CM | POA: Diagnosis not present

## 2017-09-20 DIAGNOSIS — N179 Acute kidney failure, unspecified: Secondary | ICD-10-CM | POA: Diagnosis not present

## 2017-10-18 ENCOUNTER — Other Ambulatory Visit: Payer: Self-pay | Admitting: Internal Medicine

## 2017-10-22 ENCOUNTER — Other Ambulatory Visit: Payer: Self-pay | Admitting: Internal Medicine

## 2017-10-25 ENCOUNTER — Other Ambulatory Visit: Payer: Self-pay | Admitting: Internal Medicine

## 2017-11-02 ENCOUNTER — Encounter: Payer: Self-pay | Admitting: Internal Medicine

## 2017-11-02 ENCOUNTER — Other Ambulatory Visit (HOSPITAL_COMMUNITY)
Admission: RE | Admit: 2017-11-02 | Discharge: 2017-11-02 | Disposition: A | Discharge status: Home / Self Care | Payer: PPO | Source: Ambulatory Visit | Attending: Internal Medicine | Admitting: Internal Medicine

## 2017-11-02 ENCOUNTER — Ambulatory Visit (INDEPENDENT_AMBULATORY_CARE_PROVIDER_SITE_OTHER): Payer: PPO | Admitting: Internal Medicine

## 2017-11-02 VITALS — BP 148/78 | HR 98 | Temp 98.3°F | Resp 18 | Wt 168.4 lb

## 2017-11-02 DIAGNOSIS — E042 Nontoxic multinodular goiter: Secondary | ICD-10-CM

## 2017-11-02 DIAGNOSIS — I10 Essential (primary) hypertension: Secondary | ICD-10-CM | POA: Diagnosis not present

## 2017-11-02 DIAGNOSIS — R739 Hyperglycemia, unspecified: Secondary | ICD-10-CM | POA: Diagnosis not present

## 2017-11-02 DIAGNOSIS — N760 Acute vaginitis: Secondary | ICD-10-CM

## 2017-11-02 DIAGNOSIS — E7800 Pure hypercholesterolemia, unspecified: Secondary | ICD-10-CM | POA: Diagnosis not present

## 2017-11-02 DIAGNOSIS — R300 Dysuria: Secondary | ICD-10-CM | POA: Insufficient documentation

## 2017-11-02 DIAGNOSIS — N183 Chronic kidney disease, stage 3 unspecified: Secondary | ICD-10-CM

## 2017-11-02 LAB — POCT URINALYSIS DIPSTICK
BILIRUBIN UA: NEGATIVE
GLUCOSE UA: NEGATIVE
KETONES UA: NEGATIVE
Nitrite, UA: NEGATIVE
Protein, UA: POSITIVE — AB
RBC UA: POSITIVE
SPEC GRAV UA: 1.015 (ref 1.010–1.025)
Urobilinogen, UA: NEGATIVE E.U./dL — AB
pH, UA: 6.5 (ref 5.0–8.0)

## 2017-11-02 LAB — URINALYSIS, MICROSCOPIC ONLY: RBC / HPF: NONE SEEN (ref 0–?)

## 2017-11-02 NOTE — Progress Notes (Signed)
Patient ID: Shaana Acocella, female   DOB: Jul 01, 1940, 77 y.o.   MRN: [id number]   Subjective:    Patient ID: [name], female    DOB: 26-May-1940, 77 y.o.   MRN: [id number]  HPI  Patient here for a scheduled follow up.  She reports she is doing relatively well.  States her blood pressure has been averaging 140s/70s.  Trying to stay active.  No chest pain.  No sob.  No acid reflux.  No abdominal pain.  Bowels moving.  Does report some vaginal discharge.  No itching or burning.  No bleeding or spotting.     Past Medical History:  Diagnosis Date  . Anemia   . Diverticulosis   . GERD (gastroesophageal reflux disease)   . Glaucoma   . Hypercholesterolemia   . Hypertension   . Thyroid nodule    Past Surgical History:  Procedure Laterality Date  . ABDOMINAL HYSTERECTOMY  1987   fibroids, ovaries not removed  . TONSILLECTOMY     Family History  Problem Relation Age of Onset  . Diabetes Father   . Hypertension Father   . Hypercholesterolemia Father   . Heart disease Father        CABG  . Hypertension Mother   . Hypercholesterolemia Mother   . Rheumatic fever Mother   . Hypertension Brother   . Diabetes Sister   . Breast cancer Maternal Aunt   . Breast cancer Unknown        cousin  . Colon cancer Neg Hx    Social History   Socioeconomic History  . Marital status: Divorced    Spouse name: Not on file  . Number of children: 2  . Years of education: Not on file  . Highest education level: Not on file  Occupational History  . Not on file  Social Needs  . Financial resource strain: Not hard at all  . Food insecurity:    Worry: Never true    Inability: Never true  . Transportation needs:    Medical: No    Non-medical: No  Tobacco Use  . Smoking status: Never Smoker  . Smokeless tobacco: Never Used  Substance and Sexual Activity  . Alcohol use: No    Alcohol/week: 0.0 standard drinks  . Drug use: No  . Sexual activity: Not Currently  Lifestyle  .  Physical activity:    Days per week: Not on file    Minutes per session: Not on file  . Stress: Not on file  Relationships  . Social connections:    Talks on phone: Not on file    Gets together: Not on file    Attends religious service: Not on file    Active member of club or organization: Not on file    Attends meetings of clubs or organizations: Not on file    Relationship status: Not on file  Other Topics Concern  . Not on file  Social History Narrative  . Not on file    Outpatient Encounter Medications as of 11/02/2017  Medication Sig  . acetaminophen (TYLENOL) 325 MG tablet Take 650 mg by mouth every 4 (four) hours as needed.  Marland Kitchen aspirin 81 MG tablet Take 81 mg by mouth daily.  . fish oil-omega-3 fatty acids 1000 MG capsule Take 1 g by mouth 3 (three) times daily.  Marland Kitchen losartan (COZAAR) 50 MG tablet TAKE 1 TABLET BY MOUTH TWICE A DAY  . Multiple Vitamin (MULTIVITAMIN) tablet Take 1 tablet by mouth daily.  Marland Kitchen  spironolactone (ALDACTONE) 25 MG tablet TAKE 1 TABLET BY MOUTH EVERY DAY  . Turmeric 500 MG TABS Take 500 mg by mouth 3 (three) times daily.   No facility-administered encounter medications on file as of 11/02/2017.     Review of Systems  Constitutional: Negative for appetite change and unexpected weight change.  HENT: Negative for congestion and sinus pressure.   Respiratory: Negative for cough, chest tightness and shortness of breath.   Cardiovascular: Negative for chest pain, palpitations and leg swelling.  Gastrointestinal: Negative for abdominal pain, diarrhea, nausea and vomiting.  Genitourinary: Positive for vaginal discharge. Negative for difficulty urinating, dysuria, vaginal bleeding and vaginal pain.  Musculoskeletal: Negative for joint swelling and myalgias.  Skin: Negative for color change and rash.  Neurological: Negative for dizziness, light-headedness and headaches.  Psychiatric/Behavioral: Negative for agitation and dysphoric mood.       Objective:      Blood pressure rechecked by me:  148/78  Physical Exam  Constitutional: She appears well-developed and well-nourished. No distress.  HENT:  Nose: Nose normal.  Mouth/Throat: Oropharynx is clear and moist.  Neck: Neck supple. No thyromegaly present.  Cardiovascular: Normal rate and regular rhythm.  Pulmonary/Chest: Breath sounds normal. No respiratory distress. She has no wheezes.  Abdominal: Soft. Bowel sounds are normal. There is no tenderness.  Genitourinary:  Genitourinary Comments: Normal external genitalia.  Vaginal vault without lesions.  KOH/wet prep obtained.  Could not appreciate any adnexal masses or tenderness.    Musculoskeletal: She exhibits no edema or tenderness.  Lymphadenopathy:    She has no cervical adenopathy.  Skin: No rash noted. No erythema.  Psychiatric: She has a normal mood and affect. Her behavior is normal.    BP (!) 148/78   Pulse 98   Temp 98.3 F (36.8 C) (Oral)   Resp 18   Wt 168 lb 6.4 oz (76.4 kg)   LMP 02/21/1984   SpO2 97%   BMI 29.83 kg/m  Wt Readings from Last 3 Encounters:  11/02/17 168 lb 6.4 oz (76.4 kg)  07/24/17 167 lb 3.2 oz (75.8 kg)  05/15/17 164 lb 3.2 oz (74.5 kg)     Lab Results  Component Value Date   WBC 10.2 07/24/2017   HGB 12.8 07/24/2017   HCT 37.7 07/24/2017   PLT 278.0 07/24/2017   GLUCOSE 114 (H) 08/14/2017   CHOL 181 05/15/2017   TRIG 144.0 05/15/2017   HDL 45.90 05/15/2017   LDLDIRECT 163.4 10/25/2012   LDLCALC 107 (H) 05/15/2017   ALT 18 05/15/2017   AST 18 05/15/2017   NA 133 (L) 08/14/2017   K 4.8 08/14/2017   CL 100 08/14/2017   CREATININE 1.38 (H) 08/14/2017   BUN 22 08/14/2017   CO2 25 08/14/2017   TSH 1.75 05/15/2017   HGBA1C 5.9 05/15/2017    US Renal  Result Date: 09/13/2017 CLINICAL DATA:  Acute renal failure. EXAM: RENAL / URINARY TRACT ULTRASOUND COMPLETE COMPARISON:  None. FINDINGS: Right Kidney: Length: 11.2 cm. Echogenicity within normal limits. No mass or hydronephrosis  visualized. Left Kidney: Length: 10.5 cm. 2.2 cm simple cyst is noted in midpole. Echogenicity within normal limits. No mass or hydronephrosis visualized. Bladder: Appears normal for degree of bladder distention. Bilateral ureteral jets are noted. IMPRESSION: Simple left renal cyst.  No other renal abnormality seen. Electronically Signed   By: Marijo Conception, M.D.   On: 09/13/2017 15:03       Assessment & Plan:   Problem List Items Addressed This Visit  CKD (chronic kidney disease)    Continue to avoid antiinflammatories.  Sees nephrology.  Follow metabolic panel.        Hypercholesterolemia    She has declined cholesterol medication.  Low cholesterol diet and exercise.  Follow lipid panel.        Relevant Orders   Hepatic function panel   Lipid panel   Hyperglycemia    Low carb diet and exercise.  Follow met b and a1c.        Relevant Orders   Hemoglobin A1c   Hypertension    Blood pressure as outlined.  Checks outside averaging 140/70s.  Continue current medication regimen.  Follow pressures.  Follow metabolic panel.        Relevant Orders   Basic metabolic panel   Multiple thyroid nodules    Has seen Dr Eddie Dibbles.  Follow thyroid function.        Other Visit Diagnoses    Dysuria    -  Primary   No dysuria reported.  hold on checking urinalysis.     Relevant Orders   POCT urinalysis dipstick (Completed)   Urine Microscopic (Completed)   Urine Culture (Completed)   Acute vaginitis       Vaginal discharge as outlined.  KOH/wet pretp obtained.     Relevant Orders   Cervicovaginal ancillary only       Einar Pheasant, MD

## 2017-11-05 ENCOUNTER — Encounter: Payer: Self-pay | Admitting: Internal Medicine

## 2017-11-05 NOTE — Assessment & Plan Note (Signed)
Continue to avoid antiinflammatories.  Sees nephrology.  Follow metabolic panel.   

## 2017-11-05 NOTE — Assessment & Plan Note (Signed)
Has seen Dr Eddie Dibbles.  Follow thyroid function.

## 2017-11-05 NOTE — Assessment & Plan Note (Signed)
Low carb diet and exercise.  Follow met b and a1c.   

## 2017-11-05 NOTE — Assessment & Plan Note (Signed)
Blood pressure as outlined.  Checks outside averaging 140/70s.  Continue current medication regimen.  Follow pressures.  Follow metabolic panel.

## 2017-11-05 NOTE — Assessment & Plan Note (Signed)
She has declined cholesterol medication.  Low cholesterol diet and exercise.  Follow lipid panel.

## 2017-11-06 LAB — CERVICOVAGINAL ANCILLARY ONLY: Wet Prep (BD Affirm): POSITIVE — AB

## 2017-11-06 NOTE — Addendum Note (Signed)
Addended by: Leeanne Rio on: 11/06/2017 02:54 PM   Modules accepted: Orders

## 2017-11-07 ENCOUNTER — Other Ambulatory Visit: Payer: Self-pay

## 2017-11-08 ENCOUNTER — Other Ambulatory Visit: Payer: Self-pay | Admitting: Internal Medicine

## 2017-11-08 MED ORDER — CLINDAMYCIN PHOSPHATE 2 % VA CREA: 1.0000 | TOPICAL_CREAM | Freq: Every day | VAGINAL | 0 refills | Status: DC

## 2017-11-08 NOTE — Progress Notes (Signed)
rx sent in for cleocin vaginal cream.   

## 2017-11-17 ENCOUNTER — Encounter: Payer: Self-pay | Admitting: Family Medicine

## 2017-11-17 ENCOUNTER — Ambulatory Visit (INDEPENDENT_AMBULATORY_CARE_PROVIDER_SITE_OTHER): Payer: PPO | Admitting: Family Medicine

## 2017-11-17 ENCOUNTER — Ambulatory Visit: Payer: Self-pay | Admitting: *Deleted

## 2017-11-17 VITALS — BP 144/72 | HR 97 | Temp 98.6°F | Ht 62.0 in | Wt 169.4 lb

## 2017-11-17 DIAGNOSIS — T7840XA Allergy, unspecified, initial encounter: Secondary | ICD-10-CM | POA: Diagnosis not present

## 2017-11-17 DIAGNOSIS — R252 Cramp and spasm: Secondary | ICD-10-CM | POA: Diagnosis not present

## 2017-11-17 MED ORDER — METHYLPREDNISOLONE 4 MG PO TBPK: ORAL_TABLET | ORAL | 0 refills | Status: DC

## 2017-11-17 NOTE — Progress Notes (Signed)
Subjective:    Patient ID: Cathy Vazquez, female    DOB: [birth year], 77 y.o.   MRN: [id number]  HPI   Patient presents to clinic complaining of tingling type sensation in feet, arms, face after using a muscles cramp spray she bought over-the-counter.  Patient is unsure of name of muscle cramps spray, but used it to spray on the legs and arms.  States after she uses she began to feel a tingling type sensation in arms and feet, also on face -patient speculates she fell on her face as well because she did touch her face after using the muscle cramps spray.  She first used the spray a week ago, tingling type sensation resolved on its own after a few minutes.  Patient decided to use a spray again yesterday, and same sensation occurred.  Patient now believes that she is having some sort of allergic reaction to the muscle cramps spray.  Denies any muscle weakness, denies any one-sided extremity weakness, denies any changes in vision or speech, denies any other neurological type symptoms.  Denies any trouble breathing or feelings of throat closing up.  No fever or chills.  Patient Active Problem List   Diagnosis Date Noted  . CKD (chronic kidney disease) 09/03/2016  . Low back pain 05/03/2015  . Hyperglycemia 12/14/2014  . Health care maintenance 08/11/2014  . Lesion of skin of cheek 11/17/2013  . Knee pain 08/04/2013  . Right leg pain 03/31/2013  . Hypertension 02/21/2012  . Hypercholesterolemia 02/21/2012  . Multiple thyroid nodules 02/21/2012   Social History   Tobacco Use  . Smoking status: Never Smoker  . Smokeless tobacco: Never Used  Substance Use Topics  . Alcohol use: No    Alcohol/week: 0.0 standard drinks    Review of Systems  Constitutional: Negative for chills, fatigue and fever.  HENT: Negative for congestion, ear pain, sinus pain and sore throat.   Eyes: Negative.   Respiratory: Negative for cough, shortness of breath and wheezing.   Cardiovascular: Negative  for chest pain, palpitations and leg swelling.  Gastrointestinal: Negative for abdominal pain, diarrhea, nausea and vomiting.  Genitourinary: Negative for dysuria, frequency and urgency.  Musculoskeletal: Negative for arthralgias and myalgias.  Skin: tingling sensation in face, arms, feet after using muscle cramp spray Neurological: Negative for syncope, light-headedness and headaches.  Psychiatric/Behavioral: The patient is not nervous/anxious.       Objective:   Physical Exam  Constitutional: She appears well-developed and well-nourished. No distress.  Head: Normocephalic and atraumatic.  Eyes: EOM are normal. No scleral icterus.  Conjunctive are normal Nose/Throat: Normal. No redness, swelling. Neck: Normal range of motion. Neck supple. No tracheal deviation present.  Cardiovascular: Normal rate, regular rhythm and normal heart sounds.  Pulmonary/Chest: Effort normal and breath sounds normal. No respiratory distress. She has no wheezes. She has no rales.  Neurological: She is alert and oriented to person, place, and time.  Gait normal  Skin: Skin is warm and dry. No pallor.  Psychiatric: She has a normal mood and affect. Her behavior is normal. Thought content normal.   Nursing note and vitals reviewed.   Vitals:   11/17/17 1556  BP: (!) 144/72  Pulse: 97  Temp: 98.6 F (37 C)  SpO2: 94%       Assessment & Plan:    A total of 25  minutes were spent face-to-face with the patient during this encounter and over half of that time was spent on counseling and coordination of care. The  patient was counseled on why not to use muscle cramps spray any longer, ways to treat muscle cramps.   Allergic reaction to drug- I suspect sensation patient describes after using muscles cramp spray could possibly be allergic reaction or sensitivity to the ingredients in the spray.  Patient has been advised to discard the spray if not to use it again.  Patient will take Medrol Dosepak to calm  down any sort of inflammatory response that could be related to use of spray.  Muscle cramps- discussed different strategies to help with muscle cramps that do not involve use of medications including keeping self well-hydrated, eating balanced diet, doing stretches.  Patient given handout outlining these strategies to help improve muscle cramps and also outlining any alarm symptoms to call and make office aware of if they do occur.   Keep regularly scheduled follow-up as planned.  Return to clinic sooner if issues persist or worsen.

## 2017-11-17 NOTE — Patient Instructions (Signed)
Keep well hydrated with gatorade, water. Can try eating a small pickle or some mustard for cramps. Also be sure to eat a good variety of fruits and vegetables.   Leg Cramps Leg cramps occur when a muscle or muscles tighten and you have no control over this tightening (involuntary muscle contraction). Muscle cramps can develop in any muscle, but the most common place is in the calf muscles of the leg. Those cramps can occur during exercise or when you are at rest. Leg cramps are painful, and they may last for a few seconds to a few minutes. Cramps may return several times before they finally stop. Usually, leg cramps are not caused by a serious medical problem. In many cases, the cause is not known. Some common causes include:  Overexertion.  Overuse from repetitive motions, or doing the same thing over and over.  Remaining in a certain position for a long period of time.  Improper preparation, form, or technique while performing a sport or an activity.  Dehydration.  Injury.  Side effects of some medicines.  Abnormally low levels of the salts and ions in your blood (electrolytes), especially potassium and calcium. These levels could be low if you are taking water pills (diuretics) or if you are pregnant.  Follow these instructions at home: Watch your condition for any changes. Taking the following actions may help to lessen any discomfort that you are feeling:  Stay well-hydrated. Drink enough fluid to keep your urine clear or pale yellow.  Try massaging, stretching, and relaxing the affected muscle. Do this for several minutes at a time.  For tight or tense muscles, use a warm towel, heating pad, or hot shower water directed to the affected area.  If you are sore or have pain after a cramp, applying ice to the affected area may relieve discomfort. ? Put ice in a plastic bag. ? Place a towel between your skin and the bag. ? Leave the ice on for 20 minutes, 2-3 times per  day.  Avoid strenuous exercise for several days if you have been having frequent leg cramps.  Make sure that your diet includes the essential minerals for your muscles to work normally.  Take medicines only as directed by your health care provider.  Contact a health care provider if:  Your leg cramps get more severe or more frequent, or they do not improve over time.  Your foot becomes cold, numb, or blue. This information is not intended to replace advice given to you by your health care provider. Make sure you discuss any questions you have with your health care provider. Document Released: 03/17/2004 Document Revised: 07/16/2015 Document Reviewed: 01/15/2014 Elsevier Interactive Patient Education  [employer].

## 2017-11-17 NOTE — Telephone Encounter (Signed)
Patient has been scheduled for appt

## 2017-11-17 NOTE — Telephone Encounter (Signed)
Pt calls stating that she is "feeling funny, tinglingly, nauseous, weak, and feels like her throat is closing"; she says that it started last night 11/16/17; the pt says that she was constipated but once she had a bowel movement she felt better but now the symptoms have returned; the pt is most concerned that her throat is "dry and feels funny"; she also says that she felt like this 3 months ago; recommendations made per nurse triage protocol; she normally sees Dr Einar Pheasant but she has no availability within parameters per guidelines; pt offered and accepted appointment with Philis Nettle, [place], 11/17/17 at 1600; she verbalized understanding; will route to office for notification of this upcoming appointment.     Reason for Disposition . Taking a medicine that could cause weakness (e.g., blood pressure medications, diuretics)  Answer Assessment - Initial Assessment Questions 1. DESCRIPTION: "Describe how you are feeling."    "feeling funny, nauseous, weak, and feels like her throat is closing";   2. SEVERITY: "How bad is it?"  "Can you stand and walk?"   - MILD - Feels weak or tired, but does not interfere with work, school or normal activities   - [place] to stand and walk; weakness interferes with work, school, or normal activities   - SEVERE - Unable to stand or walk     moderate 3. ONSET:  "When did the weakness begin?"     11/16/17 4. CAUSE: "What do you think is causing the weakness?"     Not sure; used spray for foot cramps 5. MEDICINES: "Have you recently started a new medicine or had a change in the amount of a medicine?"     Spray used for foot cramps 6. OTHER SYMPTOMS: "Do you have any other symptoms?" (e.g., chest pain, fever, cough, SOB, vomiting, diarrhea, bleeding, other areas of pain)     no 7. PREGNANCY: "Is there any chance you are pregnant?" "When was your last menstrual period?"     no  Protocols used: WEAKNESS (GENERALIZED) AND FATIGUE-A-AH

## 2017-11-20 ENCOUNTER — Encounter: Payer: Self-pay | Admitting: Family Medicine

## 2017-11-22 ENCOUNTER — Telehealth: Payer: Self-pay | Admitting: Internal Medicine

## 2017-11-22 NOTE — Telephone Encounter (Signed)
Copied from [place] [id number]. Topic: Quick Communication - See Telephone Encounter >> Nov 22, 2017  8:55 AM Ahmed Prima L wrote: CRM for notification. See Telephone encounter for: 11/22/17.  Patient called and stated that she has been constipated some. She has been taking milk of magnesium to get her bowels to move. She said this has been going on since Saturday. Please advise. She can be reached after 2 pm.

## 2017-11-22 NOTE — Telephone Encounter (Signed)
Patient stated that she no longer needs anything, she has had a bowel movement

## 2017-11-22 NOTE — Telephone Encounter (Signed)
Please advise 

## 2017-11-23 ENCOUNTER — Telehealth: Payer: Self-pay | Admitting: Internal Medicine

## 2017-11-23 NOTE — Telephone Encounter (Signed)
Are you okay with checking her labs or would you like for her to be seen?

## 2017-11-23 NOTE — Telephone Encounter (Signed)
Copied from [place] [phone]. Topic: Quick Communication - See Telephone Encounter >> Nov 23, 2017 11:03 AM Ahmed Prima L wrote: CRM for notification. See Telephone encounter for: 11/23/17.  Patient would like to know can she come in for lab work? She said she has been weak, nauseous, and thinks her sodium is low. This has been going on since June. Please advise.

## 2017-11-23 NOTE — Telephone Encounter (Signed)
I do not mind checking labs, but if weak and having nausea - needs to be seen.

## 2017-11-23 NOTE — Telephone Encounter (Signed)
Please advise, requesting labs

## 2017-11-23 NOTE — Telephone Encounter (Signed)
Advised patient that she needed to be seen. Scheduled her with Lauren on 10/4 at 2:30

## 2017-11-24 ENCOUNTER — Encounter: Payer: Self-pay | Admitting: Family Medicine

## 2017-11-24 ENCOUNTER — Ambulatory Visit (INDEPENDENT_AMBULATORY_CARE_PROVIDER_SITE_OTHER): Payer: PPO | Admitting: Family Medicine

## 2017-11-24 VITALS — BP 158/88 | HR 97 | Temp 98.0°F | Ht 62.0 in | Wt 164.6 lb

## 2017-11-24 DIAGNOSIS — E871 Hypo-osmolality and hyponatremia: Secondary | ICD-10-CM

## 2017-11-24 DIAGNOSIS — R531 Weakness: Secondary | ICD-10-CM | POA: Diagnosis not present

## 2017-11-24 DIAGNOSIS — R110 Nausea: Secondary | ICD-10-CM | POA: Diagnosis not present

## 2017-11-24 LAB — COMPREHENSIVE METABOLIC PANEL
AG Ratio: 1.6 (calc) (ref 1.0–2.5)
ALT: 16 U/L (ref 6–29)
AST: 16 U/L (ref 10–35)
Albumin: 4.3 g/dL (ref 3.6–5.1)
Alkaline phosphatase (APISO): 74 U/L (ref 33–130)
BUN / CREAT RATIO: 24 (calc) — AB (ref 6–22)
BUN: 26 mg/dL — ABNORMAL HIGH (ref 7–25)
CHLORIDE: 96 mmol/L — AB (ref 98–110)
CO2: 23 mmol/L (ref 20–32)
CREATININE: 1.07 mg/dL — AB (ref 0.60–0.93)
Calcium: 9.5 mg/dL (ref 8.6–10.4)
GLUCOSE: 108 mg/dL — AB (ref 65–99)
Globulin: 2.7 g/dL (calc) (ref 1.9–3.7)
Potassium: 5.2 mmol/L (ref 3.5–5.3)
Sodium: 129 mmol/L — ABNORMAL LOW (ref 135–146)
Total Bilirubin: 0.4 mg/dL (ref 0.2–1.2)
Total Protein: 7 g/dL (ref 6.1–8.1)

## 2017-11-24 LAB — CBC
HCT: 35.4 % (ref 35.0–45.0)
Hemoglobin: 12 g/dL (ref 11.7–15.5)
MCH: 29.5 pg (ref 27.0–33.0)
MCHC: 33.9 g/dL (ref 32.0–36.0)
MCV: 87 fL (ref 80.0–100.0)
MPV: 10.7 fL (ref 7.5–12.5)
PLATELETS: 385 10*3/uL (ref 140–400)
RBC: 4.07 10*6/uL (ref 3.80–5.10)
RDW: 13.2 % (ref 11.0–15.0)
WBC: 16.9 10*3/uL — ABNORMAL HIGH (ref 3.8–10.8)

## 2017-11-24 NOTE — Progress Notes (Signed)
Subjective:    Patient ID: Cathy Vazquez, female    DOB: 1940/03/07, 77 y.o.   MRN: [id number]  HPI  Presents to clinic c/o weakness and nausea for 1 day.  States she had a similar episode a few months ago, and it was discovered that her sodium level was a little low.  Denies any vomiting or diarrhea.  Does note that she has had a harder time moving her bowels the past couple of days, bought self a magnesium supplement to help with bowel movements.  Patient states the weakness and nausea have subsided today, feels back to her normal self.  Recent lab work from June 2019 reviewed: CBC Latest Ref Rng & Units 07/24/2017 05/15/2017 05/09/2017  WBC 4.0 - 10.5 K/uL 10.2 14.0(H) 14.6(H)  Hemoglobin 12.0 - 15.0 g/dL 12.8 13.3 14.0  Hematocrit 36.0 - 46.0 % 37.7 39.6 40.7  Platelets 150.0 - 400.0 K/uL 278.0 302.0 300   CMP Latest Ref Rng & Units 08/14/2017 07/24/2017 05/25/2017  Glucose 70 - 99 mg/dL 114(H) 131(H) -  BUN 6 - 23 mg/dL 22 21 -  Creatinine 0.40 - 1.20 mg/dL 1.38(H) 1.19 -  Sodium 135 - 145 mEq/L 133(L) 135 134(L)  Potassium 3.5 - 5.1 mEq/L 4.8 4.5 -  Chloride 96 - 112 mEq/L 100 102 -  CO2 19 - 32 mEq/L 25 25 -  Calcium 8.4 - 10.5 mg/dL 9.8 9.7 -  Total Protein 6.0 - 8.3 g/dL - - -  Total Bilirubin 0.2 - 1.2 mg/dL - - -  Alkaline Phos 39 - 117 U/L - - -  AST 0 - 37 U/L - - -  ALT 0 - 35 U/L - - -    Patient Active Problem List   Diagnosis Date Noted  . CKD (chronic kidney disease) 09/03/2016  . Low back pain 05/03/2015  . Hyperglycemia 12/14/2014  . Health care maintenance 08/11/2014  . Lesion of skin of cheek 11/17/2013  . Knee pain 08/04/2013  . Right leg pain 03/31/2013  . Hypertension 02/21/2012  . Hypercholesterolemia 02/21/2012  . Multiple thyroid nodules 02/21/2012   Social History   Tobacco Use  . Smoking status: Never Smoker  . Smokeless tobacco: Never Used  Substance Use Topics  . Alcohol use: No    Alcohol/week: 0.0 standard drinks   Review of  Systems  Constitutional: Negative for chills, and fever. +weakness HENT: Negative for congestion, ear pain, sinus pain and sore throat.   Eyes: Negative.   Respiratory: Negative for cough, shortness of breath and wheezing.   Cardiovascular: Negative for chest pain, palpitations and leg swelling.  Gastrointestinal: +nausea. Negative for abdominal pain, diarrhea, and vomiting.  Genitourinary: Negative for dysuria, frequency and urgency.  Musculoskeletal: Negative for arthralgias and myalgias.  Skin: Negative for color change, pallor and rash.  Neurological: Negative for syncope, light-headedness and headaches.  Psychiatric/Behavioral: The patient is not nervous/anxious.       Objective:   Physical Exam  Constitutional: She appears well-developed and well-nourished. No distress.  Head: Normocephalic and atraumatic.  Eyes: EOM are normal. No scleral icterus.  Neck: Normal range of motion. Neck supple. No tracheal deviation present.  Cardiovascular: Normal rate, regular rhythm and normal heart sounds. No LE edema.  Pulmonary/Chest: Effort normal and breath sounds normal. No respiratory distress. She has no wheezes. She has no rales.  Abdominal: Soft. Bowel sounds are normal. There is no tenderness. No guarding.  Neurological: She is alert and oriented to person, place, and time.  Gait  normal  Skin: Skin is warm and dry. No pallor.  Psychiatric: She has a normal mood and affect. Her behavior is normal. Thought content normal.   Nursing note and vitals reviewed.     Vitals:   11/24/17 1429  BP: (!) 158/88  Pulse: 97  Temp: 98 F (36.7 C)  SpO2: 97%    Assessment & Plan:    Nausea/weakness - we will get CBC and CMP to double check for any anemia, electrolyte abnormalities, check liver and kidney functions.  Nausea & weakness has subsided today, discussed eating bland diet for the next few days with clear liquids to be sure nausea is truly resolved.  Offered to send in  prescription for Zofran to have around as needed for episodes of nausea, but patient declines.  Constipation - patient advised that she can start using a fiber supplement such as Metamucil few times a week to help bowels move more regularly.  Patient also given handout outlining good foods to eat that can help with constipation treatment such as fresh fruits and vegetables, whole grains, beans.  Keep regularly scheduled follow-up as planned.  Return to clinic sooner if issues persist or worsen.

## 2017-11-24 NOTE — Patient Instructions (Signed)
Nausea, Adult Feeling sick to your stomach (nausea) means that your stomach is upset or you feel like you have to throw up (vomit). Feeling sick to your stomach is usually not serious, but it may be an early sign of a more serious medical problem. As you feel sicker to your stomach, it can lead to throwing up (vomiting). If you throw up, or if you are not able to drink enough fluids, there is a risk of dehydration. Dehydration can make you feel tired and thirsty, have a dry mouth, and pee (urinate) less often. Older adults and people who have other diseases or a weak defense (immune) system have a higher risk of dehydration. The main goal of treating this condition is to:  Limit how often you feel sick to your stomach.  Prevent throwing up and dehydration.  Follow these instructions at home: Follow instructions from your doctor about how to care for yourself at home. Eating and drinking Follow these recommendations as told by your doctor:  Take an oral rehydration solution (ORS). This is a drink that is sold at pharmacies and stores.  Drink clear fluids in small amounts as you are able, such as: ? Water. ? Ice chips. ? Fruit juice that has water added (diluted fruit juice). ? Low-calorie sports drinks.  Eat bland, easy to digest foods in small amounts as you are able, such as: ? Bananas. ? Applesauce. ? Rice. ? Lean meats. ? Toast. ? Crackers.  Avoid drinking fluids that contain a lot of sugar or caffeine.  Avoid alcohol.  Avoid spicy or fatty foods.  General instructions  Drink enough fluid to keep your pee (urine) clear or pale yellow.  Wash your hands often. If you cannot use soap and water, use hand sanitizer.  Make sure that all people in your household wash their hands well and often.  Rest at home while you get better.  Take over-the-counter and prescription medicines only as told by your doctor.  Breathe slowly and deeply when you feel sick to your  stomach.  Watch your condition for any changes.  Keep all follow-up visits as told by your doctor. This is important. Contact a doctor if:  You have a headache.  You have new symptoms.  You feel sicker to your stomach.  You have a fever.  You feel light-headed or dizzy.  You throw up.  You are not able to keep fluids down. Get help right away if:  You have pain in your chest, neck, arm, or jaw.  You feel very weak or you pass out (faint).  You have throw up that is bright red or looks like coffee grounds.  You have bloody or black poop (stools), or poop that looks like tar.  You have a very bad headache, a stiff neck, or both.  You have very bad pain, cramping, or bloating in your belly.  You have a rash.  You have trouble breathing or you are breathing very quickly.  Your heart is beating very quickly.  Your skin feels cold and clammy.  You feel confused.  You have pain while peeing.  You have signs of dehydration, such as: ? Dark pee, or very little or no pee. ? Cracked lips. ? Dry mouth. ? Sunken eyes. ? Sleepiness. ? Weakness. These symptoms may be an emergency. Do not wait to see if the symptoms will go away. Get medical help right away. Call your local emergency services (911 in the [place] Do not drive yourself to   the hospital. This information is not intended to replace advice given to you by your health care provider. Make sure you discuss any questions you have with your health care provider. Document Released: 01/27/2011 Document Revised: 07/16/2015 Document Reviewed: 10/14/2014 Elsevier Interactive Patient Education  2018 [employer].   Constipation, Adult Constipation is when a person:  Poops (has a bowel movement) fewer times in a week than normal.  Has a hard time pooping.  Has poop that is dry, hard, or bigger than normal.  Follow these instructions at home: Eating and drinking   Eat foods that have a lot of fiber, such  as: ? Fresh fruits and vegetables. ? Whole grains. ? Beans.  Eat less of foods that are high in fat, low in fiber, or overly processed, such as: ? [place] fries. ? Hamburgers. ? Cookies. ? Candy. ? Soda.  Drink enough fluid to keep your pee (urine) clear or pale yellow. General instructions  Exercise regularly or as told by your doctor.  Go to the restroom when you feel like you need to poop. Do not hold it in.  Take over-the-counter and prescription medicines only as told by your doctor. These include any fiber supplements.  Do pelvic floor retraining exercises, such as: ? Doing deep breathing while relaxing your lower belly (abdomen). ? Relaxing your pelvic floor while pooping.  Watch your condition for any changes.  Keep all follow-up visits as told by your doctor. This is important. Contact a doctor if:  You have pain that gets worse.  You have a fever.  You have not pooped for 4 days.  You throw up (vomit).  You are not hungry.  You lose weight.  You are bleeding from the anus.  You have thin, pencil-like poop (stool). Get help right away if:  You have a fever, and your symptoms suddenly get worse.  You leak poop or have blood in your poop.  Your belly feels hard or bigger than normal (is bloated).  You have very bad belly pain.  You feel dizzy or you faint. This information is not intended to replace advice given to you by your health care provider. Make sure you discuss any questions you have with your health care provider. Document Released: 07/27/2007 Document Revised: 08/28/2015 Document Reviewed: 07/29/2015 Elsevier Interactive Patient Education  2018 [employer].

## 2017-11-27 ENCOUNTER — Other Ambulatory Visit: Payer: Self-pay | Admitting: Family

## 2017-11-27 DIAGNOSIS — E871 Hypo-osmolality and hyponatremia: Secondary | ICD-10-CM

## 2017-11-27 DIAGNOSIS — D72829 Elevated white blood cell count, unspecified: Secondary | ICD-10-CM

## 2017-11-27 NOTE — Progress Notes (Signed)
close

## 2017-12-01 ENCOUNTER — Other Ambulatory Visit (INDEPENDENT_AMBULATORY_CARE_PROVIDER_SITE_OTHER): Payer: PPO

## 2017-12-01 DIAGNOSIS — E871 Hypo-osmolality and hyponatremia: Secondary | ICD-10-CM

## 2017-12-01 DIAGNOSIS — D72829 Elevated white blood cell count, unspecified: Secondary | ICD-10-CM

## 2017-12-01 LAB — CBC WITH DIFFERENTIAL/PLATELET
BASOS PCT: 1 % (ref 0.0–3.0)
Basophils Absolute: 0.1 10*3/uL (ref 0.0–0.1)
EOS ABS: 0.2 10*3/uL (ref 0.0–0.7)
Eosinophils Relative: 1.3 % (ref 0.0–5.0)
HEMATOCRIT: 37.4 % (ref 36.0–46.0)
Hemoglobin: 12.6 g/dL (ref 12.0–15.0)
LYMPHS ABS: 2.5 10*3/uL (ref 0.7–4.0)
LYMPHS PCT: 18.3 % (ref 12.0–46.0)
MCHC: 33.7 g/dL (ref 30.0–36.0)
MCV: 89.3 fl (ref 78.0–100.0)
Monocytes Absolute: 0.7 10*3/uL (ref 0.1–1.0)
Monocytes Relative: 4.8 % (ref 3.0–12.0)
NEUTROS ABS: 10.2 10*3/uL — AB (ref 1.4–7.7)
NEUTROS PCT: 74.6 % (ref 43.0–77.0)
PLATELETS: 349 10*3/uL (ref 150.0–400.0)
RBC: 4.19 Mil/uL (ref 3.87–5.11)
RDW: 14.6 % (ref 11.5–15.5)
WBC: 13.7 10*3/uL — ABNORMAL HIGH (ref 4.0–10.5)

## 2017-12-01 LAB — SODIUM: Sodium: 135 mEq/L (ref 135–145)

## 2017-12-02 ENCOUNTER — Other Ambulatory Visit: Payer: Self-pay | Admitting: Family

## 2017-12-02 DIAGNOSIS — D72829 Elevated white blood cell count, unspecified: Secondary | ICD-10-CM

## 2017-12-05 DIAGNOSIS — M9903 Segmental and somatic dysfunction of lumbar region: Secondary | ICD-10-CM | POA: Diagnosis not present

## 2017-12-05 DIAGNOSIS — M9904 Segmental and somatic dysfunction of sacral region: Secondary | ICD-10-CM | POA: Diagnosis not present

## 2017-12-05 DIAGNOSIS — M9905 Segmental and somatic dysfunction of pelvic region: Secondary | ICD-10-CM | POA: Diagnosis not present

## 2017-12-05 DIAGNOSIS — M5417 Radiculopathy, lumbosacral region: Secondary | ICD-10-CM | POA: Diagnosis not present

## 2018-01-04 ENCOUNTER — Other Ambulatory Visit: Payer: PPO

## 2018-01-10 ENCOUNTER — Other Ambulatory Visit (INDEPENDENT_AMBULATORY_CARE_PROVIDER_SITE_OTHER): Payer: PPO

## 2018-01-10 DIAGNOSIS — E7800 Pure hypercholesterolemia, unspecified: Secondary | ICD-10-CM

## 2018-01-10 DIAGNOSIS — I10 Essential (primary) hypertension: Secondary | ICD-10-CM

## 2018-01-10 DIAGNOSIS — R739 Hyperglycemia, unspecified: Secondary | ICD-10-CM | POA: Diagnosis not present

## 2018-01-10 LAB — HEPATIC FUNCTION PANEL
ALT: 17 U/L (ref 0–35)
AST: 17 U/L (ref 0–37)
Albumin: 4.3 g/dL (ref 3.5–5.2)
Alkaline Phosphatase: 66 U/L (ref 39–117)
BILIRUBIN TOTAL: 0.6 mg/dL (ref 0.2–1.2)
Bilirubin, Direct: 0.1 mg/dL (ref 0.0–0.3)
Total Protein: 7.3 g/dL (ref 6.0–8.3)

## 2018-01-10 LAB — BASIC METABOLIC PANEL
BUN: 18 mg/dL (ref 6–23)
CALCIUM: 9.6 mg/dL (ref 8.4–10.5)
CHLORIDE: 102 meq/L (ref 96–112)
CO2: 25 meq/L (ref 19–32)
Creatinine, Ser: 0.92 mg/dL (ref 0.40–1.20)
GFR: 62.52 mL/min (ref >60.00)
GLUCOSE: 103 mg/dL — AB (ref 70–99)
Potassium: 4.2 mEq/L (ref 3.5–5.1)
SODIUM: 134 meq/L — AB (ref 135–145)

## 2018-01-10 LAB — LIPID PANEL
CHOL/HDL RATIO: 5
Cholesterol: 199 mg/dL (ref 0–200)
HDL: 42.5 mg/dL (ref >39.00)
NONHDL: 156.44
Triglycerides: 234 mg/dL — ABNORMAL HIGH (ref 0.0–149.0)
VLDL: 46.8 mg/dL — ABNORMAL HIGH (ref 0.0–40.0)

## 2018-01-10 LAB — LDL CHOLESTEROL, DIRECT: LDL DIRECT: 145 mg/dL

## 2018-01-10 LAB — HEMOGLOBIN A1C: Hgb A1c MFr Bld: 5.9 % (ref 4.6–6.5)

## 2018-01-17 ENCOUNTER — Other Ambulatory Visit: Payer: Self-pay | Admitting: Internal Medicine

## 2018-01-17 DIAGNOSIS — N390 Urinary tract infection, site not specified: Secondary | ICD-10-CM | POA: Diagnosis not present

## 2018-01-17 DIAGNOSIS — N179 Acute kidney failure, unspecified: Secondary | ICD-10-CM | POA: Diagnosis not present

## 2018-01-17 DIAGNOSIS — I10 Essential (primary) hypertension: Secondary | ICD-10-CM | POA: Diagnosis not present

## 2018-01-17 DIAGNOSIS — N10 Acute tubulo-interstitial nephritis: Secondary | ICD-10-CM | POA: Diagnosis not present

## 2018-01-19 ENCOUNTER — Other Ambulatory Visit: Payer: Self-pay | Admitting: Internal Medicine

## 2018-01-24 DIAGNOSIS — N183 Chronic kidney disease, stage 3 (moderate): Secondary | ICD-10-CM | POA: Diagnosis not present

## 2018-01-24 DIAGNOSIS — I129 Hypertensive chronic kidney disease with stage 1 through stage 4 chronic kidney disease, or unspecified chronic kidney disease: Secondary | ICD-10-CM | POA: Diagnosis not present

## 2018-01-24 DIAGNOSIS — N390 Urinary tract infection, site not specified: Secondary | ICD-10-CM | POA: Diagnosis not present

## 2018-02-06 ENCOUNTER — Other Ambulatory Visit (INDEPENDENT_AMBULATORY_CARE_PROVIDER_SITE_OTHER): Payer: PPO

## 2018-02-06 DIAGNOSIS — D72829 Elevated white blood cell count, unspecified: Secondary | ICD-10-CM

## 2018-02-06 LAB — CBC WITH DIFFERENTIAL/PLATELET
Basophils Absolute: 0.2 10*3/uL — ABNORMAL HIGH (ref 0.0–0.1)
Basophils Relative: 1.5 % (ref 0.0–3.0)
EOS PCT: 1.9 % (ref 0.0–5.0)
Eosinophils Absolute: 0.2 10*3/uL (ref 0.0–0.7)
HEMATOCRIT: 38.1 % (ref 36.0–46.0)
HEMOGLOBIN: 12.5 g/dL (ref 12.0–15.0)
LYMPHS ABS: 2.6 10*3/uL (ref 0.7–4.0)
Lymphocytes Relative: 22.7 % (ref 12.0–46.0)
MCHC: 32.9 g/dL (ref 30.0–36.0)
MCV: 90.4 fl (ref 78.0–100.0)
MONOS PCT: 6.2 % (ref 3.0–12.0)
Monocytes Absolute: 0.7 10*3/uL (ref 0.1–1.0)
Neutro Abs: 7.7 10*3/uL (ref 1.4–7.7)
Neutrophils Relative %: 67.7 % (ref 43.0–77.0)
Platelets: 276 10*3/uL (ref 150.0–400.0)
RBC: 4.21 Mil/uL (ref 3.87–5.11)
RDW: 15.3 % (ref 11.5–15.5)
WBC: 11.4 10*3/uL — ABNORMAL HIGH (ref 4.0–10.5)

## 2018-02-07 ENCOUNTER — Ambulatory Visit (INDEPENDENT_AMBULATORY_CARE_PROVIDER_SITE_OTHER): Payer: PPO | Admitting: Internal Medicine

## 2018-02-07 ENCOUNTER — Encounter: Payer: Self-pay | Admitting: Internal Medicine

## 2018-02-07 DIAGNOSIS — R739 Hyperglycemia, unspecified: Secondary | ICD-10-CM | POA: Diagnosis not present

## 2018-02-07 DIAGNOSIS — I10 Essential (primary) hypertension: Secondary | ICD-10-CM | POA: Diagnosis not present

## 2018-02-07 DIAGNOSIS — N183 Chronic kidney disease, stage 3 unspecified: Secondary | ICD-10-CM

## 2018-02-07 DIAGNOSIS — R109 Unspecified abdominal pain: Secondary | ICD-10-CM

## 2018-02-07 DIAGNOSIS — E7800 Pure hypercholesterolemia, unspecified: Secondary | ICD-10-CM | POA: Diagnosis not present

## 2018-02-07 MED ORDER — ONDANSETRON 4 MG PO TBDP: 4.0000 mg | ORAL_TABLET | Freq: Every day | ORAL | 0 refills | Status: DC | PRN

## 2018-02-07 NOTE — Patient Instructions (Signed)
pepcid 20mg  per day.  Take 30 minutes before your evening meal or 30 minutes before breakfast.

## 2018-02-07 NOTE — Progress Notes (Signed)
Patient ID: Cathy Vazquez, female   DOB: 03/14/1940, 77 y.o.   MRN: [id number]   Subjective:    Patient ID: Cathy Vazquez, female    DOB: 1940-12-05, 77 y.o.   MRN: [id number]  HPI  Patient here for a scheduled follow up.   She reports she has been doing relatively well.  Has been feeling good until this am.  Woke this am with nausea. Mouth feels dry.  Has intermittent episodes.  Will have some loose stool associated.  Will feel weak.  May have a flare one time per month.  Denies acic reflux.  Some occasional lower abdominal/pelvic discomfort.  No pain now.  No urine change.  No chest pain.  No sob.  Outside blood pressure checks averaging 130-140s/60s.     Past Medical History:  Diagnosis Date  . Anemia   . Diverticulosis   . GERD (gastroesophageal reflux disease)   . Glaucoma   . Hypercholesterolemia   . Hypertension   . Thyroid nodule    Past Surgical History:  Procedure Laterality Date  . ABDOMINAL HYSTERECTOMY  1987   fibroids, ovaries not removed  . TONSILLECTOMY     Family History  Problem Relation Age of Onset  . Diabetes Father   . Hypertension Father   . Hypercholesterolemia Father   . Heart disease Father        CABG  . Hypertension Mother   . Hypercholesterolemia Mother   . Rheumatic fever Mother   . Hypertension Brother   . Diabetes Sister   . Breast cancer Maternal Aunt   . Breast cancer Other        cousin  . Colon cancer Neg Hx    Social History   Socioeconomic History  . Marital status: Divorced    Spouse name: Not on file  . Number of children: 2  . Years of education: Not on file  . Highest education level: Not on file  Occupational History  . Not on file  Social Needs  . Financial resource strain: Not hard at all  . Food insecurity:    Worry: Never true    Inability: Never true  . Transportation needs:    Medical: No    Non-medical: No  Tobacco Use  . Smoking status: Never Smoker  . Smokeless tobacco: Never Used  Substance  and Sexual Activity  . Alcohol use: No    Alcohol/week: 0.0 standard drinks  . Drug use: No  . Sexual activity: Not Currently  Lifestyle  . Physical activity:    Days per week: Not on file    Minutes per session: Not on file  . Stress: Not on file  Relationships  . Social connections:    Talks on phone: Not on file    Gets together: Not on file    Attends religious service: Not on file    Active member of club or organization: Not on file    Attends meetings of clubs or organizations: Not on file    Relationship status: Not on file  Other Topics Concern  . Not on file  Social History Narrative  . Not on file    Outpatient Encounter Medications as of 02/07/2018  Medication Sig  . acetaminophen (TYLENOL) 325 MG tablet Take 650 mg by mouth every 4 (four) hours as needed.  . Alpha-Lipoic Acid 200 MG CAPS Take 200 mg by mouth daily.  Marland Kitchen aspirin 81 MG tablet Take 81 mg by mouth daily.  Ileene Patrick Products ([place]  PO) Take by mouth.  . clindamycin (CLEOCIN) 2 % vaginal cream Place 1 Applicatorful vaginally at bedtime.  . fish oil-omega-3 fatty acids 1000 MG capsule Take 1 g by mouth 3 (three) times daily.  . Multiple Vitamin (MULTIVITAMIN) tablet Take 1 tablet by mouth daily.  Marland Kitchen spironolactone (ALDACTONE) 25 MG tablet TAKE 1 TABLET BY MOUTH EVERY DAY  . Turmeric 500 MG TABS Take 500 mg by mouth 3 (three) times daily.  . [DISCONTINUED] losartan (COZAAR) 50 MG tablet TAKE 1 TABLET BY MOUTH TWICE A DAY  . losartan (COZAAR) 100 MG tablet Take 1 tablet (100 mg total) by mouth daily.  . ondansetron (ZOFRAN ODT) 4 MG disintegrating tablet Take 1 tablet (4 mg total) by mouth daily as needed for nausea or vomiting.  . [DISCONTINUED] methylPREDNISolone (MEDROL DOSEPAK) 4 MG TBPK tablet Take according to pack instructions   No facility-administered encounter medications on file as of 02/07/2018.     Review of Systems  Constitutional: Negative for appetite change and unexpected weight  change.  HENT: Negative for congestion and sinus pressure.   Respiratory: Negative for cough, chest tightness and shortness of breath.   Cardiovascular: Negative for chest pain, palpitations and leg swelling.  Gastrointestinal:       Intermittent abdominal pain, nausea and loose stool.  Intermittent flares.  May occur one time per month.    Genitourinary: Negative for difficulty urinating and dysuria.  Musculoskeletal: Negative for joint swelling and myalgias.  Skin: Negative for color change and rash.  Neurological: Negative for dizziness, light-headedness and headaches.  Psychiatric/Behavioral: Negative for agitation and dysphoric mood.       Objective:    Physical Exam Constitutional:      General: She is not in acute distress.    Appearance: Normal appearance.  HENT:     Nose: Nose normal. No congestion.     Mouth/Throat:     Pharynx: No oropharyngeal exudate or posterior oropharyngeal erythema.  Neck:     Musculoskeletal: Neck supple. No muscular tenderness.     Thyroid: No thyromegaly.  Cardiovascular:     Rate and Rhythm: Normal rate and regular rhythm.  Pulmonary:     Effort: No respiratory distress.     Breath sounds: Normal breath sounds. No wheezing.  Abdominal:     General: Bowel sounds are normal.     Palpations: Abdomen is soft.     Tenderness: There is no abdominal tenderness.  Musculoskeletal:        General: No swelling or tenderness.  Lymphadenopathy:     Cervical: No cervical adenopathy.  Skin:    Findings: No erythema or rash.  Neurological:     Mental Status: She is alert.  Psychiatric:        Mood and Affect: Mood normal.        Behavior: Behavior normal.     BP (!) 158/64 (BP Location: Left Arm, Patient Position: Sitting, Cuff Size: Large)   Pulse 96   Temp 98 F (36.7 C) (Oral)   Resp 18   Wt 170 lb 6.4 oz (77.3 kg)   LMP 02/21/1984   SpO2 98%   BMI 31.17 kg/m  Wt Readings from Last 3 Encounters:  02/07/18 170 lb 6.4 oz (77.3 kg)    11/24/17 164 lb 9.6 oz (74.7 kg)  11/17/17 169 lb 6.4 oz (76.8 kg)     Lab Results  Component Value Date   WBC 11.4 (H) 02/06/2018   HGB 12.5 02/06/2018   HCT 38.1 02/06/2018  PLT 276.0 02/06/2018   GLUCOSE 103 (H) 01/10/2018   CHOL 199 01/10/2018   TRIG 234.0 (H) 01/10/2018   HDL 42.50 01/10/2018   LDLDIRECT 145.0 01/10/2018   LDLCALC 107 (H) 05/15/2017   ALT 17 01/10/2018   AST 17 01/10/2018   NA 134 (L) 01/10/2018   K 4.2 01/10/2018   CL 102 01/10/2018   CREATININE 0.92 01/10/2018   BUN 18 01/10/2018   CO2 25 01/10/2018   TSH 1.75 05/15/2017   HGBA1C 5.9 01/10/2018       Assessment & Plan:   Problem List Items Addressed This Visit    Abdominal pain    Intermittent abdominal pain and nausea with bowel change as outlined.  Discussed with her today.  Start pepcid.  Discussed further w/up including ultrasound/CT scan abdomen/pelvis.  She declines further testing and work up.  Wants to monitor.  Schedule f/u soon to reassess.        CKD (chronic kidney disease)    Continue to avoid antiinflammatories.  Sees nephrology.  Follow metabolic panel.        Hypercholesterolemia    Declines cholesterol medication.  Low cholesterol diet and exercise.  Follow lipid panel.        Relevant Medications   losartan (COZAAR) 100 MG tablet   Hyperglycemia    Low carb diet and exercise.  Follow met b and a1c.        Hypertension    Blood pressure as outlined.  Outside checks as outlined.  Follow pressures.  Follow metabolic panel.  Same medication regimen.        Relevant Medications   losartan (COZAAR) 100 MG tablet       Einar Pheasant, MD

## 2018-02-11 ENCOUNTER — Encounter: Payer: Self-pay | Admitting: Internal Medicine

## 2018-02-11 DIAGNOSIS — R109 Unspecified abdominal pain: Secondary | ICD-10-CM | POA: Insufficient documentation

## 2018-02-11 MED ORDER — LOSARTAN POTASSIUM 100 MG PO TABS: 100.0000 mg | ORAL_TABLET | Freq: Every day | ORAL | 1 refills | Status: DC

## 2018-02-11 NOTE — Assessment & Plan Note (Signed)
Low carb diet and exercise.  Follow met b and a1c.   

## 2018-02-11 NOTE — Assessment & Plan Note (Signed)
Declines cholesterol medication.  Low cholesterol diet and exercise.  Follow lipid panel.   

## 2018-02-11 NOTE — Assessment & Plan Note (Signed)
Continue to avoid antiinflammatories.  Sees nephrology.  Follow metabolic panel.

## 2018-02-11 NOTE — Assessment & Plan Note (Signed)
Intermittent abdominal pain and nausea with bowel change as outlined.  Discussed with her today.  Start pepcid.  Discussed further w/up including ultrasound/CT scan abdomen/pelvis.  She declines further testing and work up.  Wants to monitor.  Schedule f/u soon to reassess.

## 2018-02-11 NOTE — Assessment & Plan Note (Signed)
Blood pressure as outlined.  Outside checks as outlined.  Follow pressures.  Follow metabolic panel.  Same medication regimen.

## 2018-03-12 ENCOUNTER — Ambulatory Visit: Payer: Self-pay | Admitting: Urology

## 2018-03-21 ENCOUNTER — Ambulatory Visit: Payer: Self-pay

## 2018-03-21 NOTE — Telephone Encounter (Signed)
Pt called with nausea, numbness, weakness, thirsty. Pt has been evaluated for these symptoms before. Pt stated she has had theses symptoms since June. Pt has an appointment tomorrow. Pt advised to call back if symptoms worsen. Pt advised to keep appointment for tomorrow.   Reason for Disposition . [1] Numbness or tingling on both sides of body AND [2] is a new symptom present > 24 hours  Answer Assessment - Initial Assessment Questions 1. SYMPTOM: "What is the main symptom you are concerned about?" (e.g., weakness, numbness)     Weakness and numbness, nausea, thirsty 2. ONSET: "When did this start?" (minutes, hours, days; while sleeping)     Yesterday morning having off and on since June  3. LAST NORMAL: "When was the last time you were normal (no symptoms)?"     Before June 4. PATTERN "Does this come and go, or has it been constant since it started?"  "Is it present now?"     Comes and goes 5. CARDIAC SYMPTOMS: "Have you had any of the following symptoms: chest pain, difficulty breathing, palpitations?"     Right side of chest "a spot" "feels like I need to burp" 6. NEUROLOGIC SYMPTOMS: "Have you had any of the following symptoms: headache, dizziness, vision loss, double vision, changes in speech, unsteady on your feet?"     Unsteady on feet feels "a little unbalance" pt stated that when she moves around that the symptoms are of a lesser degree. 7. OTHER SYMPTOMS: "Do you have any other symptoms?"     no 8. PREGNANCY: "Is there any chance you are pregnant?" "When was your last menstrual period?"     n/a  Protocols used: NEUROLOGIC DEFICIT-A-AH

## 2018-03-22 ENCOUNTER — Ambulatory Visit (INDEPENDENT_AMBULATORY_CARE_PROVIDER_SITE_OTHER): Payer: PPO | Admitting: Internal Medicine

## 2018-03-22 ENCOUNTER — Encounter: Payer: Self-pay | Admitting: Internal Medicine

## 2018-03-22 ENCOUNTER — Other Ambulatory Visit: Payer: Self-pay | Admitting: Internal Medicine

## 2018-03-22 VITALS — BP 138/74 | HR 97 | Temp 97.8°F | Resp 16 | Wt 166.6 lb

## 2018-03-22 DIAGNOSIS — R739 Hyperglycemia, unspecified: Secondary | ICD-10-CM | POA: Diagnosis not present

## 2018-03-22 DIAGNOSIS — R42 Dizziness and giddiness: Secondary | ICD-10-CM

## 2018-03-22 DIAGNOSIS — D72829 Elevated white blood cell count, unspecified: Secondary | ICD-10-CM | POA: Diagnosis not present

## 2018-03-22 DIAGNOSIS — N183 Chronic kidney disease, stage 3 unspecified: Secondary | ICD-10-CM

## 2018-03-22 DIAGNOSIS — I10 Essential (primary) hypertension: Secondary | ICD-10-CM

## 2018-03-22 DIAGNOSIS — E7800 Pure hypercholesterolemia, unspecified: Secondary | ICD-10-CM | POA: Diagnosis not present

## 2018-03-22 DIAGNOSIS — E875 Hyperkalemia: Secondary | ICD-10-CM

## 2018-03-22 DIAGNOSIS — R531 Weakness: Secondary | ICD-10-CM | POA: Diagnosis not present

## 2018-03-22 DIAGNOSIS — E871 Hypo-osmolality and hyponatremia: Secondary | ICD-10-CM

## 2018-03-22 LAB — BASIC METABOLIC PANEL
BUN: 24 mg/dL — ABNORMAL HIGH (ref 6–23)
CHLORIDE: 99 meq/L (ref 96–112)
CO2: 26 mEq/L (ref 19–32)
Calcium: 10.7 mg/dL — ABNORMAL HIGH (ref 8.4–10.5)
Creatinine, Ser: 1.06 mg/dL (ref 0.40–1.20)
GFR: 49.93 mL/min — ABNORMAL LOW (ref >60.00)
Glucose, Bld: 105 mg/dL — ABNORMAL HIGH (ref 70–99)
POTASSIUM: 5 meq/L (ref 3.5–5.1)
Sodium: 133 mEq/L — ABNORMAL LOW (ref 135–145)

## 2018-03-22 LAB — CBC WITH DIFFERENTIAL/PLATELET
BASOS ABS: 0.1 10*3/uL (ref 0.0–0.1)
Basophils Relative: 0.9 % (ref 0.0–3.0)
Eosinophils Absolute: 0.1 10*3/uL (ref 0.0–0.7)
Eosinophils Relative: 0.6 % (ref 0.0–5.0)
HCT: 39.7 % (ref 36.0–46.0)
Hemoglobin: 13.3 g/dL (ref 12.0–15.0)
LYMPHS PCT: 16.1 % (ref 12.0–46.0)
Lymphs Abs: 2.1 10*3/uL (ref 0.7–4.0)
MCHC: 33.5 g/dL (ref 30.0–36.0)
MCV: 90.4 fl (ref 78.0–100.0)
Monocytes Absolute: 0.9 10*3/uL (ref 0.1–1.0)
Monocytes Relative: 6.7 % (ref 3.0–12.0)
Neutro Abs: 9.8 10*3/uL — ABNORMAL HIGH (ref 1.4–7.7)
Neutrophils Relative %: 75.7 % (ref 43.0–77.0)
Platelets: 315 10*3/uL (ref 150.0–400.0)
RBC: 4.39 Mil/uL (ref 3.87–5.11)
RDW: 15.1 % (ref 11.5–15.5)
WBC: 12.9 10*3/uL — ABNORMAL HIGH (ref 4.0–10.5)

## 2018-03-22 NOTE — Progress Notes (Signed)
Order placed for f/u labs.  

## 2018-03-22 NOTE — Progress Notes (Signed)
Patient ID: Cathy Vazquez, female   DOB: Nov 11, 1940, 78 y.o.   MRN: [id number]   Subjective:    Patient ID: Cathy Vazquez, female    DOB: [birth year], 78 y.o.   MRN: [id number]  HPI  Patient here for a scheduled follow up.  She reports she has noticed some light headedness.  States has been doing relatively well.  Reports that approximately 5 days ago, she noticed her face was flushed and some minimal pain - top of her head.  Blood pressure was elevated - 170/80.  Had been eating a lot of foods with increased salt.  Also increased caffeine.  She has adjusted her sodium intake.  Has decreased her caffeine intake and tea intake.  Blood pressure better.  Still some minimal light headedness.  Notices more in am.  Eats something - feels better.  Moving around makes her feel better.  States has been occurring intermittently.  No vomiting.  No diarrhea.  Eating.  No headache now.  No chest pain.  No sob.  No acid reflux.  No abdominal pain.  States feels better after talking today.  She is concerned her sodium may be low.     Past Medical History:  Diagnosis Date  . Anemia   . Diverticulosis   . GERD (gastroesophageal reflux disease)   . Glaucoma   . Hypercholesterolemia   . Hypertension   . Thyroid nodule    Past Surgical History:  Procedure Laterality Date  . ABDOMINAL HYSTERECTOMY  1987   fibroids, ovaries not removed  . TONSILLECTOMY     Family History  Problem Relation Age of Onset  . Diabetes Father   . Hypertension Father   . Hypercholesterolemia Father   . Heart disease Father        CABG  . Hypertension Mother   . Hypercholesterolemia Mother   . Rheumatic fever Mother   . Hypertension Brother   . Diabetes Sister   . Breast cancer Maternal Aunt   . Breast cancer Other        cousin  . Colon cancer Neg Hx    Social History   Socioeconomic History  . Marital status: Divorced    Spouse name: Not on file  . Number of children: 2  . Years of education: Not on  file  . Highest education level: Not on file  Occupational History  . Not on file  Social Needs  . Financial resource strain: Not hard at all  . Food insecurity:    Worry: Never true    Inability: Never true  . Transportation needs:    Medical: No    Non-medical: No  Tobacco Use  . Smoking status: Never Smoker  . Smokeless tobacco: Never Used  Substance and Sexual Activity  . Alcohol use: No    Alcohol/week: 0.0 standard drinks  . Drug use: No  . Sexual activity: Not Currently  Lifestyle  . Physical activity:    Days per week: Not on file    Minutes per session: Not on file  . Stress: Not on file  Relationships  . Social connections:    Talks on phone: Not on file    Gets together: Not on file    Attends religious service: Not on file    Active member of club or organization: Not on file    Attends meetings of clubs or organizations: Not on file    Relationship status: Not on file  Other Topics Concern  . Not on  file  Social History Narrative  . Not on file    Outpatient Encounter Medications as of 03/22/2018  Medication Sig  . acetaminophen (TYLENOL) 325 MG tablet Take 650 mg by mouth every 4 (four) hours as needed.  . Alpha-Lipoic Acid 200 MG CAPS Take 200 mg by mouth daily.  Marland Kitchen aspirin 81 MG tablet Take 81 mg by mouth daily.  Marland Kitchen Bioflavonoid Products (BIOFLEX PO) Take by mouth.  . clindamycin (CLEOCIN) 2 % vaginal cream Place 1 Applicatorful vaginally at bedtime.  . fish oil-omega-3 fatty acids 1000 MG capsule Take 1 g by mouth 3 (three) times daily.  Marland Kitchen losartan (COZAAR) 100 MG tablet Take 1 tablet (100 mg total) by mouth daily.  . Multiple Vitamin (MULTIVITAMIN) tablet Take 1 tablet by mouth daily.  . ondansetron (ZOFRAN ODT) 4 MG disintegrating tablet Take 1 tablet (4 mg total) by mouth daily as needed for nausea or vomiting.  Marland Kitchen spironolactone (ALDACTONE) 25 MG tablet TAKE 1 TABLET BY MOUTH EVERY DAY  . Turmeric 500 MG TABS Take 500 mg by mouth 3 (three) times  daily.   No facility-administered encounter medications on file as of 03/22/2018.     Review of Systems  Constitutional: Negative for appetite change and unexpected weight change.  HENT: Negative for congestion and sinus pressure.   Respiratory: Negative for cough, chest tightness and shortness of breath.   Cardiovascular: Negative for chest pain, palpitations and leg swelling.  Gastrointestinal: Negative for abdominal pain, diarrhea, nausea and vomiting.  Genitourinary: Negative for difficulty urinating and dysuria.  Musculoskeletal: Negative for joint swelling and myalgias.  Skin: Negative for color change and rash.  Neurological: Positive for light-headedness. Negative for dizziness and headaches.  Psychiatric/Behavioral: Negative for agitation and dysphoric mood.       Objective:    Physical Exam Constitutional:      General: She is not in acute distress.    Appearance: Normal appearance.  HENT:     Nose: Nose normal. No congestion.     Mouth/Throat:     Pharynx: No oropharyngeal exudate or posterior oropharyngeal erythema.  Neck:     Musculoskeletal: Neck supple. No muscular tenderness.     Thyroid: No thyromegaly.  Cardiovascular:     Rate and Rhythm: Normal rate and regular rhythm.  Pulmonary:     Effort: No respiratory distress.     Breath sounds: Normal breath sounds. No wheezing.  Abdominal:     General: Bowel sounds are normal.     Palpations: Abdomen is soft.     Tenderness: There is no abdominal tenderness.  Musculoskeletal:        General: No swelling or tenderness.  Lymphadenopathy:     Cervical: No cervical adenopathy.  Skin:    Findings: No erythema or rash.  Neurological:     Mental Status: She is alert.  Psychiatric:        Mood and Affect: Mood normal.        Behavior: Behavior normal.     BP 138/74   Pulse 97   Temp 97.8 F (36.6 C) (Oral)   Resp 16   Wt 166 lb 9.6 oz (75.6 kg)   LMP 02/21/1984   SpO2 99%   BMI 30.47 kg/m  Wt  Readings from Last 3 Encounters:  03/22/18 166 lb 9.6 oz (75.6 kg)  02/07/18 170 lb 6.4 oz (77.3 kg)  11/24/17 164 lb 9.6 oz (74.7 kg)     Lab Results  Component Value Date   WBC 12.9 (  H) 03/22/2018   HGB 13.3 03/22/2018   HCT 39.7 03/22/2018   PLT 315.0 03/22/2018   GLUCOSE 105 (H) 03/22/2018   CHOL 199 01/10/2018   TRIG 234.0 (H) 01/10/2018   HDL 42.50 01/10/2018   LDLDIRECT 145.0 01/10/2018   LDLCALC 107 (H) 05/15/2017   ALT 17 01/10/2018   AST 17 01/10/2018   NA 133 (L) 03/22/2018   K 5.0 03/22/2018   CL 99 03/22/2018   CREATININE 1.06 03/22/2018   BUN 24 (H) 03/22/2018   CO2 26 03/22/2018   TSH 1.75 05/15/2017   HGBA1C 5.9 01/10/2018       Assessment & Plan:   Problem List Items Addressed This Visit    CKD (chronic kidney disease)    Saw nephrology.  Kidney function improved.  Avoid antiinflammatories.  Follow metabolic panel.        Hypercholesterolemia    Declines cholesterol medication.  Low cholesterol diet and exercise.  Follow lipid panel.        Hyperglycemia    Low carb diet and exercise.  Follow met b and a1c.        Hypertension    Blood pressure on recheck improved.  Her checks recently have been better.  Follow.  Will continue same medication regimen.  Follow metabolic panel.  Recheck today.        Light headedness    Some weakness and light headedness as outlined.  Blood pressure ok.  EKG - SR with no acute ischemic changes.  Discussed further w/up.  Discussed carotid ultrasound, scanning, etc.  Will check metabolic panel and cbc.  She is agreeable to lab check, but wants to hold on any further testing.  Wants to monitor.  Follow pressures.         Other Visit Diagnoses    Weakness    -  Primary   Check labs as outlined.  EKG - SR with no acute ischemic changes.  Desires no further w/up.  Follow.     Relevant Orders   EKG 12-Lead (Completed)   Leukocytosis, unspecified type       Recheck cbc.    Relevant Orders   CBC with  Differential/Platelet (Completed)   Hyponatremia       Recheck sodium today.     Relevant Orders   Basic metabolic panel (Completed)       Einar Pheasant, MD

## 2018-03-25 ENCOUNTER — Encounter: Payer: Self-pay | Admitting: Internal Medicine

## 2018-03-25 DIAGNOSIS — R42 Dizziness and giddiness: Secondary | ICD-10-CM | POA: Insufficient documentation

## 2018-03-25 NOTE — Assessment & Plan Note (Signed)
Blood pressure on recheck improved.  Her checks recently have been better.  Follow.  Will continue same medication regimen.  Follow metabolic panel.  Recheck today.

## 2018-03-25 NOTE — Assessment & Plan Note (Signed)
Declines cholesterol medication.  Low cholesterol diet and exercise.  Follow lipid panel.   

## 2018-03-25 NOTE — Assessment & Plan Note (Signed)
Saw nephrology.  Kidney function improved.  Avoid antiinflammatories.  Follow metabolic panel.

## 2018-03-25 NOTE — Assessment & Plan Note (Signed)
Low carb diet and exercise.  Follow met b and a1c.   

## 2018-03-25 NOTE — Assessment & Plan Note (Signed)
Some weakness and light headedness as outlined.  Blood pressure ok.  EKG - SR with no acute ischemic changes.  Discussed further w/up.  Discussed carotid ultrasound, scanning, etc.  Will check metabolic panel and cbc.  She is agreeable to lab check, but wants to hold on any further testing.  Wants to monitor.  Follow pressures.

## 2018-03-26 ENCOUNTER — Other Ambulatory Visit (INDEPENDENT_AMBULATORY_CARE_PROVIDER_SITE_OTHER): Payer: PPO

## 2018-03-26 DIAGNOSIS — E875 Hyperkalemia: Secondary | ICD-10-CM

## 2018-03-26 LAB — CALCIUM: Calcium: 9.8 mg/dL (ref 8.4–10.5)

## 2018-03-26 LAB — POTASSIUM: POTASSIUM: 4.9 meq/L (ref 3.5–5.1)

## 2018-04-16 ENCOUNTER — Other Ambulatory Visit: Payer: Self-pay | Admitting: Internal Medicine

## 2018-04-18 ENCOUNTER — Other Ambulatory Visit: Payer: Self-pay

## 2018-04-18 ENCOUNTER — Telehealth: Payer: Self-pay | Admitting: Internal Medicine

## 2018-04-18 MED ORDER — OLMESARTAN MEDOXOMIL 40 MG PO TABS: 40.0000 mg | ORAL_TABLET | Freq: Every day | ORAL | 1 refills | Status: DC

## 2018-04-18 NOTE — Telephone Encounter (Signed)
Is there something that we can switch her to?

## 2018-04-18 NOTE — Telephone Encounter (Signed)
Copied from [place] [id number]. Topic: Quick Communication - Rx Refill/Question >> Apr 18, 2018 12:30 PM Alanda Slim E wrote: Medication: losartan (COZAAR) 100 MG tablet - Pt would like to know what Dr. Nicki Reaper wants her to do or take since the losartan is on backorder   Has the patient contacted their pharmacy? Yes - Pharmacy advised Pt that all Losartan is on back order   Preferred Pharmacy (with phone number or street name): [id number] - [place], [place] [address] [phone] (Phone) [phone] (Fax)    Agent: Please be advised that RX refills may take up to 3 business days. We ask that you follow-up with your pharmacy.

## 2018-04-18 NOTE — Telephone Encounter (Signed)
All losartan is on back order per pharmacy. Changed rx to benicar 40 mg q day. Tried to call pt but was unable to leave message.

## 2018-04-18 NOTE — Telephone Encounter (Signed)
Is it just the 100mg  dose.  If so, she can take 50mg  (2 per day) or we can change her to benicar 40mg  q day.  This is in the same family and should be equivalent to what she is taking now.

## 2018-04-30 ENCOUNTER — Ambulatory Visit (INDEPENDENT_AMBULATORY_CARE_PROVIDER_SITE_OTHER): Payer: PPO

## 2018-04-30 VITALS — BP 134/70 | HR 99 | Temp 98.5°F | Resp 16 | Ht 63.0 in | Wt 171.0 lb

## 2018-04-30 DIAGNOSIS — Z0000 Encounter for general adult medical examination without abnormal findings: Secondary | ICD-10-CM | POA: Diagnosis not present

## 2018-04-30 NOTE — Progress Notes (Signed)
Subjective:   Cathy Vazquez is a 78 y.o. female who presents for Medicare Annual (Subsequent) preventive examination.  Review of Systems:  No ROS.  Medicare Wellness Visit. Additional risk factors are reflected in the social history. Cardiac Risk Factors include: advanced age (>69men, >58 women);hypertension     Objective:     Vitals: BP 134/70 (BP Location: Left Arm, Patient Position: Sitting, Cuff Size: Normal)   Pulse 99   Temp 98.5 F (36.9 C) (Oral)   Resp 16   Ht 5\' 3"  (1.6 m)   Wt 171 lb (77.6 kg)   LMP 02/21/1984   SpO2 96%   BMI 30.29 kg/m   Body mass index is 30.29 kg/m.  Advanced Directives 04/30/2018 05/09/2017 04/26/2017 04/25/2016 02/26/2015  Does Patient Have a Medical Advance Directive? Yes No;Yes Yes Yes Yes  Type of Paramedic of [place];Living will Healthcare Power of [place];Living will Living will;Healthcare Power of [place];Living will  Does patient want to make changes to medical advance directive? No - Patient declined No - Patient declined No - Patient declined No - Patient declined No - Patient declined  Copy of [place] in Chart? Yes - validated most recent copy scanned in chart (See row information) Yes Yes Yes No - copy requested  Would patient like information on creating a medical advance directive? - No - Patient declined - - -    Tobacco Social History   Tobacco Use  Smoking Status Never Smoker  Smokeless Tobacco Never Used     Counseling given: Not Answered   Clinical Intake:  Pre-visit preparation completed: Yes  Pain : No/denies pain     Diabetes: No  How often do you need to have someone help you when you read instructions, pamphlets, or other written materials from your doctor or pharmacy?: 1 - Never  Interpreter Needed?: No     Past Medical History:  Diagnosis Date  . Anemia   . Diverticulosis   . GERD  (gastroesophageal reflux disease)   . Glaucoma   . Hypercholesterolemia   . Hypertension   . Thyroid nodule    Past Surgical History:  Procedure Laterality Date  . ABDOMINAL HYSTERECTOMY  1987   fibroids, ovaries not removed  . TONSILLECTOMY     Family History  Problem Relation Age of Onset  . Diabetes Father   . Hypertension Father   . Hypercholesterolemia Father   . Heart disease Father        CABG  . Hypertension Mother   . Hypercholesterolemia Mother   . Rheumatic fever Mother   . Hypertension Brother   . Diabetes Sister   . Breast cancer Maternal Aunt   . Breast cancer Other        cousin  . Colon cancer Neg Hx    Social History   Socioeconomic History  . Marital status: Divorced    Spouse name: Not on file  . Number of children: 2  . Years of education: Not on file  . Highest education level: Not on file  Occupational History  . Not on file  Social Needs  . Financial resource strain: Not hard at all  . Food insecurity:    Worry: Never true    Inability: Never true  . Transportation needs:    Medical: No    Non-medical: No  Tobacco Use  . Smoking status: Never Smoker  . Smokeless tobacco: Never Used  Substance  and Sexual Activity  . Alcohol use: No    Alcohol/week: 0.0 standard drinks  . Drug use: No  . Sexual activity: Not Currently  Lifestyle  . Physical activity:    Days per week: Not on file    Minutes per session: Not on file  . Stress: Not on file  Relationships  . Social connections:    Talks on phone: Not on file    Gets together: Not on file    Attends religious service: Not on file    Active member of club or organization: Not on file    Attends meetings of clubs or organizations: Not on file    Relationship status: Not on file  Other Topics Concern  . Not on file  Social History Narrative  . Not on file    Outpatient Encounter Medications as of 04/30/2018  Medication Sig  . acetaminophen (TYLENOL) 325 MG tablet Take 650 mg by  mouth every 4 (four) hours as needed.  . Alpha-Lipoic Acid 200 MG CAPS Take 200 mg by mouth daily.  Marland Kitchen aspirin 81 MG tablet Take 81 mg by mouth daily.  Marland Kitchen Bioflavonoid Products (BIOFLEX PO) Take by mouth.  . clindamycin (CLEOCIN) 2 % vaginal cream Place 1 Applicatorful vaginally at bedtime.  . fish oil-omega-3 fatty acids 1000 MG capsule Take 1 g by mouth 3 (three) times daily.  . Multiple Vitamin (MULTIVITAMIN) tablet Take 1 tablet by mouth daily.  Marland Kitchen olmesartan (BENICAR) 40 MG tablet Take 1 tablet (40 mg total) by mouth daily.  . ondansetron (ZOFRAN ODT) 4 MG disintegrating tablet Take 1 tablet (4 mg total) by mouth daily as needed for nausea or vomiting.  Marland Kitchen spironolactone (ALDACTONE) 25 MG tablet TAKE 1 TABLET BY MOUTH EVERY DAY  . Turmeric 500 MG TABS Take 500 mg by mouth 3 (three) times daily.   No facility-administered encounter medications on file as of 04/30/2018.     Activities of Daily Living In your present state of health, do you have any difficulty performing the following activities: 04/30/2018  Hearing? N  Vision? N  Difficulty concentrating or making decisions? N  Walking or climbing stairs? N  Dressing or bathing? N  Doing errands, shopping? N  Preparing Food and eating ? N  Using the Toilet? N  In the past six months, have you accidently leaked urine? N  Do you have problems with loss of bowel control? N  Managing your Medications? N  Managing your Finances? N  Housekeeping or managing your Housekeeping? Y  Comment Daughter assists as needed  Some recent data might be hidden    Patient Care Team: Einar Pheasant, MD as PCP - General (Internal Medicine)    Assessment:   This is a routine wellness examination for Cathy Vazquez.  Health Screenings  Mammogram -05/30/17 Bone Density -09/09/14 Glaucoma -none Hearing -demonstrates normal hearing during conversation Hemoglobin A1C -05/15/17 (5.9) Cholesterol -01/10/18 (199) Dental- every 6 months Vision- 11/2017  Social    Alcohol intake -no Smoking history- never Smokers in home? none Illicit drug use? none Exercise -walking  Diet -regular  Sexually Active -not currently  Safety  Patient feels safe at home.  Patient does have smoke detectors at home  Patient does wear sunscreen or protective clothing when in direct sunlight  Patient does wear seat belt when driving or riding with others.   Activities of Daily Living Patient can assistance  With some  own household chores. Denies needing assistance with: driving, feeding themselves, getting from bed to  chair, getting to the toilet, bathing/showering, dressing, managing money, climbing flight of stairs, or preparing meals.   Depression Screen Patient denies losing interest in daily life, feeling hopeless, or crying easily over simple problems.   Fall Screen Patient denies being afraid of falling or falling in the last year.   Memory Screen Patient denies problems with memory, misplacing items, and is able to balance checkbook/bank accounts.  Patient is alert, normal appearance, oriented to person/place/and time. Correctly identified the president of the [place], recall of 3/3 objects, and performing simple calculations.  Patient displays appropriate judgement and can read correct time from watch face.   Immunizations The following Immunizations are up to date: Influenza, shingles, pneumonia, and tetanus.  Discussed pneumovax 23; deferred for follow up with her doctor.   Other Providers Patient Care Team: Einar Pheasant, MD as PCP - General (Internal Medicine)  Exercise Activities and Dietary recommendations Current Exercise Habits: Home exercise routine, Type of exercise: walking  Goals      Patient Stated   . Decrease caffeine intake (pt-stated)       Fall Risk Fall Risk  04/30/2018 04/26/2017 04/25/2016 01/11/2016 08/21/2015  Falls in the past year? 0 No No No No  Number falls in past yr: - - - - -  Follow up - - - - -   Depression  Screen PHQ 2/9 Scores 04/30/2018 04/26/2017 04/25/2016 01/11/2016  PHQ - 2 Score 0 0 0 0     Cognitive Function MMSE - Mini Mental State Exam 04/26/2017 04/25/2016 02/26/2015  Orientation to time 5 5 5   Orientation to Place 5 5 5   Registration 3 3 3   Attention/ Calculation 5 5 5   Recall 3 3 3   Language- name 2 objects 2 2 2   Language- repeat 1 1 1   Language- follow 3 step command 3 3 3   Language- read & follow direction 1 1 1   Write a sentence 1 1 1   Copy design 1 1 1   Total score 30 30 30      6CIT Screen 04/30/2018  What Year? 0 points  What month? 0 points  What time? 0 points  Count back from 20 0 points  Months in reverse 0 points  Repeat phrase 0 points  Total Score 0    Immunization History  Administered Date(s) Administered  . Influenza Split 10/29/2012  . Influenza, High Dose Seasonal PF 01/05/2017  . Pneumococcal Conjugate-13 10/29/2012  . Zoster 02/21/2009   Screening Tests Health Maintenance  Topic Date Due  . TETANUS/TDAP  08/15/1957  . MAMMOGRAM  05/31/2018  . DEXA SCAN  Completed  . INFLUENZA VACCINE  Discontinued  . PNA vac Low Risk Adult  Discontinued      Plan:    End of life planning; Advance aging; Advanced directives discussed. Copy of current HCPOA/Living Will on file.    I have personally reviewed and noted the following in the patient's chart:   . Medical and social history . Use of alcohol, tobacco or illicit drugs  . Current medications and supplements . Functional ability and status . Nutritional status . Physical activity . Advanced directives . List of other physicians . Hospitalizations, surgeries, and ER visits in previous 12 months . Vitals . Screenings to include cognitive, depression, and falls . Referrals and appointments  In addition, I have reviewed and discussed with patient certain preventive protocols, quality metrics, and best practice recommendations. A written personalized care plan for preventive services as well as  general preventive health recommendations were  provided to patient.     Varney Biles, LPN  [id number]   Agree with plan. Mable Paris, NP

## 2018-04-30 NOTE — Patient Instructions (Addendum)
  Cathy Vazquez , Thank you for taking time to come for your Medicare Wellness Visit. I appreciate your ongoing commitment to your health goals. Please review the following plan we discussed and let me know if I can assist you in the future.   These are the goals we discussed: Goals      Patient Stated   . Decrease caffeine intake (pt-stated)       This is a list of the screening recommended for you and due dates:  Health Maintenance  Topic Date Due  . Tetanus Vaccine  08/15/1957  . Mammogram  05/31/2018  . DEXA scan (bone density measurement)  Completed  . Flu Shot  Discontinued  . Pneumonia vaccines  Discontinued

## 2018-05-08 ENCOUNTER — Telehealth: Payer: Self-pay

## 2018-05-08 NOTE — Telephone Encounter (Signed)
She does need pneumovax.  Can get at place of her choice.

## 2018-05-08 NOTE — Telephone Encounter (Signed)
I have put her flu shot in the chart. Do you want me to schedule her for [place] for pneumonia shot or advise going to the pharmacy?

## 2018-05-08 NOTE — Telephone Encounter (Signed)
Copied from [place] [id number]. Topic: General - Other >> May 08, 2018  1:57 PM Bea Graff, NT wrote: Reason for CRM: Pt wanted to let Dr .Nicki Reaper know she got her flu shot, and does need her 2nd pneumonia shot. She states she is feeling better so that cancelled her 6 week follow-up.

## 2018-05-09 NOTE — Telephone Encounter (Signed)
Called pt. Unable to leave a message due to voicemail not being set up

## 2018-05-10 NOTE — Telephone Encounter (Signed)
Pt would like to get the pneumovax at the next appt here

## 2018-05-11 ENCOUNTER — Ambulatory Visit: Payer: PPO | Admitting: Internal Medicine

## 2018-05-19 ENCOUNTER — Emergency Department
Admission: EM | Admit: 2018-05-19 | Discharge: 2018-05-19 | Disposition: A | Discharge status: Home / Self Care | Payer: PPO | Attending: Emergency Medicine | Admitting: Emergency Medicine

## 2018-05-19 ENCOUNTER — Ambulatory Visit (INDEPENDENT_AMBULATORY_CARE_PROVIDER_SITE_OTHER)
Admission: EM | Admit: 2018-05-19 | Discharge: 2018-05-19 | Disposition: A | Discharge status: Home / Self Care | Payer: PPO | Source: Home / Self Care | Attending: Emergency Medicine | Admitting: Emergency Medicine

## 2018-05-19 ENCOUNTER — Other Ambulatory Visit: Payer: Self-pay

## 2018-05-19 ENCOUNTER — Emergency Department: Payer: PPO

## 2018-05-19 DIAGNOSIS — Y998 Other external cause status: Secondary | ICD-10-CM | POA: Insufficient documentation

## 2018-05-19 DIAGNOSIS — N189 Chronic kidney disease, unspecified: Secondary | ICD-10-CM | POA: Diagnosis not present

## 2018-05-19 DIAGNOSIS — W010XXA Fall on same level from slipping, tripping and stumbling without subsequent striking against object, initial encounter: Secondary | ICD-10-CM | POA: Diagnosis not present

## 2018-05-19 DIAGNOSIS — Y93H2 Activity, gardening and landscaping: Secondary | ICD-10-CM | POA: Insufficient documentation

## 2018-05-19 DIAGNOSIS — S00401A Unspecified superficial injury of right ear, initial encounter: Secondary | ICD-10-CM | POA: Diagnosis present

## 2018-05-19 DIAGNOSIS — S40011A Contusion of right shoulder, initial encounter: Secondary | ICD-10-CM | POA: Insufficient documentation

## 2018-05-19 DIAGNOSIS — Y92017 Garden or yard in single-family (private) house as the place of occurrence of the external cause: Secondary | ICD-10-CM | POA: Diagnosis not present

## 2018-05-19 DIAGNOSIS — W1789XA Other fall from one level to another, initial encounter: Secondary | ICD-10-CM | POA: Diagnosis not present

## 2018-05-19 DIAGNOSIS — Z23 Encounter for immunization: Secondary | ICD-10-CM | POA: Diagnosis not present

## 2018-05-19 DIAGNOSIS — Z79899 Other long term (current) drug therapy: Secondary | ICD-10-CM | POA: Diagnosis not present

## 2018-05-19 DIAGNOSIS — I129 Hypertensive chronic kidney disease with stage 1 through stage 4 chronic kidney disease, or unspecified chronic kidney disease: Secondary | ICD-10-CM | POA: Insufficient documentation

## 2018-05-19 DIAGNOSIS — W19XXXA Unspecified fall, initial encounter: Secondary | ICD-10-CM

## 2018-05-19 DIAGNOSIS — S4991XA Unspecified injury of right shoulder and upper arm, initial encounter: Secondary | ICD-10-CM | POA: Diagnosis not present

## 2018-05-19 DIAGNOSIS — S01311A Laceration without foreign body of right ear, initial encounter: Secondary | ICD-10-CM | POA: Diagnosis not present

## 2018-05-19 DIAGNOSIS — M25511 Pain in right shoulder: Secondary | ICD-10-CM | POA: Diagnosis not present

## 2018-05-19 MED ORDER — CLINDAMYCIN HCL 150 MG PO CAPS: ORAL_CAPSULE | ORAL | 0 refills | Status: DC

## 2018-05-19 MED ORDER — TETANUS-DIPHTH-ACELL PERTUSSIS 5-2.5-18.5 LF-MCG/0.5 IM SUSP
0.5000 mL | Freq: Once | INTRAMUSCULAR | Status: AC
  Administered 2018-05-19: 0.5 mL via INTRAMUSCULAR
  Filled 2018-05-19: qty 0.5

## 2018-05-19 NOTE — ED Triage Notes (Signed)
Pt fell last night in her yard and hit her right ear on a cement block and landed on her right shoulder as well. Having pain in her shoulder but finally got her right ear to stop bleeding. Cannot see the extent of the damage due to the dried blood, will clean the area to access.

## 2018-05-19 NOTE — ED Provider Notes (Signed)
[facility] Emergency Department Provider Note   ____________________________________________   First MD Initiated Contact with Patient 05/19/18 1047     (approximate)  I have reviewed the triage vital signs and the nursing notes.   HISTORY  Chief Complaint Fall and Laceration  HPI Cathy Vazquez is a 78 y.o. female presents to the ED with complaint of fall that occurred yesterday afternoon.  Patient states that she lost her balance while working in her yard.  Patient reports that she fell onto her right shoulder and also has laceration to her right ear from the water hose and also hitting concrete.  She denies any LOC, visual changes, nausea or vomiting.  She also denies any headache or neck pain since her fall.  Patient is uncertain of her last tetanus but knows it is been over 5 years.  She is continued her regular activity without any difficulties.  Patient went to an urgent care where she was instructed to go to the emergency department.  Patient has taken Tylenol prior to arrival with improvement of her pain.  She denies any use of blood thinners.     Past Medical History:  Diagnosis Date  . Anemia   . Diverticulosis   . GERD (gastroesophageal reflux disease)   . Glaucoma   . Hypercholesterolemia   . Hypertension   . Thyroid nodule     Patient Active Problem List   Diagnosis Date Noted  . Light headedness 03/25/2018  . Abdominal pain 02/11/2018  . CKD (chronic kidney disease) 09/03/2016  . Low back pain 05/03/2015  . Hyperglycemia 12/14/2014  . Health care maintenance 08/11/2014  . Lesion of skin of cheek 11/17/2013  . Knee pain 08/04/2013  . Right leg pain 03/31/2013  . Hypertension 02/21/2012  . Hypercholesterolemia 02/21/2012  . Multiple thyroid nodules 02/21/2012    Past Surgical History:  Procedure Laterality Date  . ABDOMINAL HYSTERECTOMY  1987   fibroids, ovaries not removed  . TONSILLECTOMY      Prior to Admission  medications   Medication Sig Start Date End Date Taking? Authorizing Provider  Alpha-Lipoic Acid 200 MG CAPS Take 200 mg by mouth daily.   Yes [provider]  Bioflavonoid Products (BIOFLEX PO) Take 1 tablet by mouth daily.    Yes [provider]  fish oil-omega-3 fatty acids 1000 MG capsule Take 1 g by mouth 3 (three) times daily.   Yes [provider]  Multiple Vitamin (MULTIVITAMIN) tablet Take 1 tablet by mouth daily.   Yes [provider]  olmesartan (BENICAR) 40 MG tablet Take 1 tablet (40 mg total) by mouth daily. 04/18/18  Yes Einar Pheasant, MD  spironolactone (ALDACTONE) 25 MG tablet TAKE 1 TABLET BY MOUTH EVERY DAY 04/16/18  Yes Einar Pheasant, MD  Turmeric 500 MG TABS Take 500 mg by mouth 3 (three) times daily.   Yes [provider]  acetaminophen (TYLENOL) 325 MG tablet Take 650 mg by mouth every 4 (four) hours as needed.    [provider]  clindamycin (CLEOCIN) 150 MG capsule 1 capsule tid x 5 days 05/19/18   Letitia Neri L, PA-C  clindamycin (CLEOCIN) 2 % vaginal cream Place 1 Applicatorful vaginally at bedtime. Patient not taking: Reported on 05/19/2018 11/08/17   Einar Pheasant, MD  ondansetron (ZOFRAN ODT) 4 MG disintegrating tablet Take 1 tablet (4 mg total) by mouth daily as needed for nausea or vomiting. Patient not taking: Reported on 05/19/2018 02/07/18   Einar Pheasant, MD  Allergies Norvasc [amlodipine besylate]; Contrast media [iodinated diagnostic agents]; Penicillins; Codeine; Diclofenac; Felodipine; Lipitor [atorvastatin]; Stay awake [caffeine]; Zocor [simvastatin]; and Benicar [olmesartan]  Family History  Problem Relation Age of Onset  . Diabetes Father   . Hypertension Father   . Hypercholesterolemia Father   . Heart disease Father        CABG  . Hypertension Mother   . Hypercholesterolemia Mother   . Rheumatic fever Mother   . Hypertension Brother   . Diabetes Sister   . Breast cancer Maternal  Aunt   . Breast cancer Other        cousin  . Colon cancer Neg Hx     Social History Social History   Tobacco Use  . Smoking status: Never Smoker  . Smokeless tobacco: Never Used  Substance Use Topics  . Alcohol use: No    Alcohol/week: 0.0 standard drinks  . Drug use: No    Review of Systems Constitutional: No fever/chills Eyes: No visual changes. ENT: No dental injuries.  Laceration to right ear. Cardiovascular: Denies chest pain. Respiratory: Denies shortness of breath. Gastrointestinal: No abdominal pain.  No nausea, no vomiting.  Musculoskeletal: Positive for right shoulder pain. Skin: Negative for rash. Neurological: Negative for headaches, focal weakness or numbness. ___________________________________________   PHYSICAL EXAM:  VITAL SIGNS: ED Triage Vitals  Enc Vitals Group     BP 05/19/18 1014 (!) 168/81     Pulse Rate 05/19/18 1014 (!) 102     Resp 05/19/18 1014 14     Temp 05/19/18 1014 98.9 F (37.2 C)     Temp Source 05/19/18 1014 Oral     SpO2 05/19/18 1014 100 %     Weight --      Height --      Head Circumference --      Peak Flow --      Pain Score 05/19/18 1015 0     Pain Loc --      Pain Edu? --      Excl. in [place]? --    Constitutional: Alert and oriented. Well appearing and in no acute distress.  Patient is talkative, laughing, answering questions appropriately. Eyes: Conjunctivae are normal. PERRL. EOMI. Head: Atraumatic. Nose: No trauma.  Right ear with laceration as noted in the skin exam. Mouth/Throat: Mucous membranes are moist.  Neck: No stridor.  No cervical tenderness on palpation posteriorly. Cardiovascular: Normal rate, regular rhythm. Grossly normal heart sounds.  Good peripheral circulation. Respiratory: Normal respiratory effort.  No retractions. Lungs CTAB.  Nontender anterior chest to palpation and no discoloration noted over the ribs. Gastrointestinal: Soft and nontender. No distention.  Musculoskeletal: Nontender thoracic  or lumbar spine to palpation no step-offs were noted.  On examination of the right shoulder there is some decreased range of motion secondary to discomfort.  Nontender clavicle or AC joint.  Nontender scapula.  No skin discoloration or edema is present.  No point tenderness is noted of the right elbow or forearm.  Patient is able to grip equally and digits move well.  Capillary refill is less than 3 seconds.  No tenderness is noted on examination of the lower extremities.  Hips are nontender bilaterally. Neurologic:  Normal speech and language. No gross focal neurologic deficits are appreciated. No gait instability. Skin:  Skin is warm, dry.  There is multiple superficial lacerations noted to her right ear.  No active bleeding at this time.  No foreign body is seen.  There is a moderate amount of  dried blood noted around the lacerations as well as in the hair.  Area is nontender to palpation.  There is in the antihelix area multiple irregular superficial lacerations without active bleeding.  To the helix there is a superficial laceration without active bleeding and no foreign body was noted.  Posteriorly there is also a laceration which does not appear to communicate with the anterior portion of the ear. Psychiatric: Mood and affect are normal. Speech and behavior are normal.  ____________________________________________   LABS (all labs ordered are listed, but only abnormal results are displayed)  Labs Reviewed - No data to display  RADIOLOGY  ED MD interpretation:   Right shoulder x-ray is negative for acute bony injury.  Official radiology report(s): Dg Shoulder Right  Result Date: 05/19/2018 CLINICAL DATA:  Right shoulder pain after fall yesterday. EXAM: RIGHT SHOULDER - 2+ VIEW COMPARISON:  None. FINDINGS: There is no evidence of fracture or dislocation. Mild degenerative changes seen involving the right acromioclavicular joint. Soft tissues are unremarkable. IMPRESSION: Mild degenerative  joint disease of right acromioclavicular joint. No acute abnormality seen in the right shoulder Electronically Signed   By: Marijo Conception, M.D.   On: 05/19/2018 11:29    ____________________________________________   PROCEDURES  Procedure(s) performed (including Critical Care):  Marland KitchenMarland KitchenLaceration Repair Date/Time: 05/19/2018 5:26 PM Performed by: Johnn Hai, PA-C Authorized by: Johnn Hai, PA-C   Consent:    Consent obtained:  Verbal   Consent given by:  Patient   Risks discussed:  Infection, poor wound healing and poor cosmetic result Anesthesia (see MAR for exact dosages):    Anesthesia method:  None Laceration details:    Location:  Ear   Ear location:  R ear Exploration:    Contaminated: no   Treatment:    Area cleansed with:  Saline   Amount of cleaning:  Standard   Irrigation solution:  Sterile saline   Irrigation method:  Tap and syringe   Visualized foreign bodies/material removed: yes   Skin repair:    Repair method:  Tissue adhesive Approximation:    Approximation:  Loose Post-procedure details:    Patient tolerance of procedure:  Tolerated well, no immediate complications   ____________________________________________   INITIAL IMPRESSION / ASSESSMENT AND PLAN / ED COURSE  As part of my medical decision making, I reviewed the following data within the [facility] Notes from prior ED visits and [place] Controlled Substance Database  Patient presents to the ED with a laceration to her right ear that occurred more than 12 hours prior to arrival.  Patient was already aware that sutures were unlikely due to the length of time that the area has been open.  Area was cleaned from dried blood and Dermabond adhesive was used to approximate the edges of the skin.  X-rays were taken of the right shoulder to evaluate and patient was made aware that these were negative for acute bony injury but degenerative changes were present.  She is to ice and  elevate her shoulder as needed for discomfort.  She will follow-up with her PCP or Dr. Roland Rack if any continued shoulder problems.  She is also cautioned to watch the laceration to her ear and return to the ED or her PCP if any signs of infection.  As a precaution she was placed on clindamycin prophylactically to reduce the chance of infection secondary to be length of time that the wound has been open.  Patient tolerated procedure well and was discharged.  ____________________________________________   FINAL CLINICAL IMPRESSION(S) / ED DIAGNOSES  Final diagnoses:  Contusion of right shoulder, initial encounter  Laceration without foreign body of right ear, initial encounter     ED Discharge Orders         Ordered    clindamycin (CLEOCIN) 150 MG capsule     05/19/18 1202           Note:  This document was prepared using Dragon voice recognition software and may include unintentional dictation errors.    Johnn Hai, PA-C 05/19/18 1731    Lavonia Drafts, MD 05/20/18 [phone]

## 2018-05-19 NOTE — Discharge Instructions (Signed)
Follow-up with your primary care provider if any continued problems.  Begin taking clindamycin for infection prevention.  Do not clean or apply Neosporin to the occluded area as this will breakdown the bond.  Area will start flaking off like dry skin and let this do it on its own.  You may take Tylenol if needed for pain.  Call your primary care provider if any urgent concerns.

## 2018-05-19 NOTE — ED Triage Notes (Signed)
Pt presents via POV from UC for mechanical fall with laceration behind right ear. Bleeding controlled at this time.

## 2018-05-19 NOTE — ED Provider Notes (Signed)
HPI  SUBJECTIVE:  Cathy Vazquez is a 78 y.o. female who presents with right ear and shoulder pain after having a mechanical fall yesterday.  States that she was standing on uneven ground, lost her balance and fell, hitting her ear on a concrete block and landing on her shoulder.  She denies nausea, vomiting, loss of consciousness.  No preceding chest pain, shortness of breath, palpitations.  No neck, chest, abdominal, back pain.  No visual changes.  No arm or leg weakness, facial droop, slurred speech.  No headache.  She tried Neosporin, and applying a Band-Aid to her ear without improvement of symptoms.  There are no other aggravating or alleviating factors.  She also reports lateral right shoulder soreness.  It is present with palpation and movement only.  She denies distal numbness or tingling, grip weakness, bruising, deformity.  She reports some limitation of motion secondary to pain.  She tried Tylenol with improvement in her pain.  Symptoms are worse with movement, palpation.  She is not on any anticoagulants or antiplatelets.  States that she has not been on aspirin in " a while".  No history of diabetes, concussion, head injury, osteoporosis.  She has a history of hypertension, chronic kidney disease.  PMD: Einar Pheasant, MD     Past Medical History:  Diagnosis Date  . Anemia   . Diverticulosis   . GERD (gastroesophageal reflux disease)   . Glaucoma   . Hypercholesterolemia   . Hypertension   . Thyroid nodule     Past Surgical History:  Procedure Laterality Date  . ABDOMINAL HYSTERECTOMY  1987   fibroids, ovaries not removed  . TONSILLECTOMY      Family History  Problem Relation Age of Onset  . Diabetes Father   . Hypertension Father   . Hypercholesterolemia Father   . Heart disease Father        CABG  . Hypertension Mother   . Hypercholesterolemia Mother   . Rheumatic fever Mother   . Hypertension Brother   . Diabetes Sister   . Breast cancer Maternal Aunt    . Breast cancer Other        cousin  . Colon cancer Neg Hx     Social History   Tobacco Use  . Smoking status: Never Smoker  . Smokeless tobacco: Never Used  Substance Use Topics  . Alcohol use: No    Alcohol/week: 0.0 standard drinks  . Drug use: No    No current facility-administered medications for this encounter.   Current Outpatient Medications:  .  acetaminophen (TYLENOL) 325 MG tablet, Take 650 mg by mouth every 4 (four) hours as needed., Disp: , Rfl:  .  Alpha-Lipoic Acid 200 MG CAPS, Take 200 mg by mouth daily., Disp: , Rfl:  .  aspirin 81 MG tablet, Take 81 mg by mouth daily., Disp: , Rfl:  .  Bioflavonoid Products (BIOFLEX PO), Take by mouth., Disp: , Rfl:  .  clindamycin (CLEOCIN) 2 % vaginal cream, Place 1 Applicatorful vaginally at bedtime., Disp: 40 g, Rfl: 0 .  fish oil-omega-3 fatty acids 1000 MG capsule, Take 1 g by mouth 3 (three) times daily., Disp: , Rfl:  .  Multiple Vitamin (MULTIVITAMIN) tablet, Take 1 tablet by mouth daily., Disp: , Rfl:  .  olmesartan (BENICAR) 40 MG tablet, Take 1 tablet (40 mg total) by mouth daily., Disp: 90 tablet, Rfl: 1 .  ondansetron (ZOFRAN ODT) 4 MG disintegrating tablet, Take 1 tablet (4 mg total) by mouth  daily as needed for nausea or vomiting., Disp: 20 tablet, Rfl: 0 .  spironolactone (ALDACTONE) 25 MG tablet, TAKE 1 TABLET BY MOUTH EVERY DAY, Disp: 90 tablet, Rfl: 0 .  Turmeric 500 MG TABS, Take 500 mg by mouth 3 (three) times daily., Disp: , Rfl:   Allergies  Allergen Reactions  . Norvasc [Amlodipine Besylate] Other (See Comments)    Headache   . Contrast Media [Iodinated Diagnostic Agents]     Tingling sensation  . Benicar [Olmesartan]   . Codeine   . Diclofenac Other (See Comments)  . Felodipine Other (See Comments)    Headache   . Lipitor [Atorvastatin]   . Stay Awake [Caffeine]   . Zocor [Simvastatin]      ROS  As noted in HPI.   Physical Exam  BP (!) 180/75 (BP Location: Left Arm)   Pulse (!) 101    Temp 98.1 F (36.7 C) (Oral)   Resp 18   Ht 5\' 3"  (1.6 m)   Wt 75.3 kg   LMP 02/21/1984   SpO2 99%   BMI 29.41 kg/m   Constitutional: Well developed, well nourished, no acute distress Eyes:  EOMI, conjunctiva normal bilaterally HENT: Normocephalic, mucus membranes moist.  No periorbital ecchymosis.  No battle sign.  No hemotympanum.  No facial tenderness.  No evidence of dental trauma.  No alveolar ridge tenderness or step-offs.  Positive laceration over the helix, through and through laceration at the crus of the ear.  No hemotympanum.       Respiratory: Normal inspiratory effort.  No chest wall tenderness. Cardiovascular: Normal rate GI: nondistended skin: No rash, skin intact Back: No C-spine, T-spine, L-spine tenderness.   Musculoskeletal: no Bruising, deformity right shoulder.  RP 2+.  R shoulder with ROM somewhat limited, Drop test  abnormal, clavicle NT, A/C joint NT, scapula NT, proximal humerus NT , anterior shoulder joint  tender, Motor strength decreased at shoulder due to pain, Sensation intact LT over deltoid region, distal NVI with hand  having intact sensation and strength in the distribution of the median, radial, and ulnar nerve.   no pain with internal rotation, no pain with external rotation, negative tenderness in bicipital groove, positive empty can test, patient unable to AB duct past 90 degrees.  Unable to perform liftoff test.  Neurologic: Alert & oriented x 3, no focal neuro deficits Psychiatric: Speech and behavior appropriate   ED Course   Medications - No data to display  No orders of the defined types were placed in this encounter.   No results found for this or any previous visit (from the past 24 hour(s)). No results found.  ED Clinical Impression  Fall, initial encounter  Complex laceration of right ear, initial encounter  Injury of right shoulder, initial encounter   ED Assessment/Plan  Patient status post mechanical fall with a  complex laceration and a simple laceration to her ear.  I do not feel comfortable repairing the complex laceration, sending to the [facility] ED for further evaluation and possible specialty evaluation.  I suspect also that she has a rotator cuff injury.  Doubt fracture or dislocation.  Feel that The patient is stable to go by private vehicle.  Discussed with triage nurse.  Discussed  MDM, treatment plan, and now for transfer to the emergency department.  Patient agrees with plan.  No orders of the defined types were placed in this encounter.   *This clinic note was created using Dragon dictation software. Therefore, there may be occasional  mistakes despite careful proofreading.   ?   Melynda Ripple, MD 05/19/18 [phone]

## 2018-05-19 NOTE — ED Notes (Addendum)
See triage note  Presents s/p fall states she tripped last pm while in yard  States she hit her head on concrete  Also hit shoulder  Laceration noted to right ear  Dried blood noted

## 2018-06-25 ENCOUNTER — Telehealth: Payer: Self-pay | Admitting: Internal Medicine

## 2018-06-25 NOTE — Telephone Encounter (Unsigned)
Copied from [place] [phone]. Topic: Quick Communication - See Telephone Encounter >> Jun 25, 2018  3:00 PM Valla Leaver wrote: CRM for notification. See Telephone encounter for: 06/25/18. Patient wants advice on taking anti-inflammatory vitamins called Resveratrol, Icariin and Curcumin?

## 2018-07-03 NOTE — Telephone Encounter (Signed)
Pt is aware and will hold on taking them

## 2018-07-03 NOTE — Telephone Encounter (Signed)
I just received this message but what are your recommendations about these vitamins? Would you like me to offer appt to discuss?

## 2018-07-03 NOTE — Telephone Encounter (Signed)
Would hold on taking these supplements, especially with [place] monitoring her blood pressure, etc.

## 2018-07-10 ENCOUNTER — Other Ambulatory Visit: Payer: Self-pay | Admitting: Internal Medicine

## 2018-09-20 ENCOUNTER — Encounter: Payer: Self-pay | Admitting: Internal Medicine

## 2018-09-20 ENCOUNTER — Ambulatory Visit (INDEPENDENT_AMBULATORY_CARE_PROVIDER_SITE_OTHER): Payer: PPO | Admitting: Internal Medicine

## 2018-09-20 ENCOUNTER — Other Ambulatory Visit: Payer: Self-pay

## 2018-09-20 DIAGNOSIS — N183 Chronic kidney disease, stage 3 unspecified: Secondary | ICD-10-CM

## 2018-09-20 DIAGNOSIS — I10 Essential (primary) hypertension: Secondary | ICD-10-CM

## 2018-09-20 DIAGNOSIS — E7800 Pure hypercholesterolemia, unspecified: Secondary | ICD-10-CM

## 2018-09-20 DIAGNOSIS — R739 Hyperglycemia, unspecified: Secondary | ICD-10-CM | POA: Diagnosis not present

## 2018-09-20 NOTE — Progress Notes (Signed)
Patient ID: Cathy Vazquez, female   DOB: 1940/09/10, 78 y.o.   MRN: [id number]   Virtual Visit via video Note  This visit type was conducted due to national recommendations for restrictions regarding the COVID-19 pandemic (e.g. social distancing).  This format is felt to be most appropriate for this patient at this time.  All issues noted in this document were discussed and addressed.  No physical exam was performed (except for noted visual exam findings with Video Visits).   I connected with Cathy Vazquez by a video enabled telemedicine application or telephone and verified that I am speaking with the correct person using two identifiers. Location patient: home Location provider: work Persons participating in the virtual visit: patient, provider  I discussed the limitations, risks, security and privacy concerns of performing an evaluation and management service by video and the availability of in person appointments.  The patient expressed understanding and agreed to proceed.   Reason for visit: scheduled follow up.   HPI: She reports she is doing well.  Feels good.  Tries to stay active around her house.  No chest pain. No sob. No acid reflux. No abdominal pain.  Bowels moving.  Has stopped caffeine.  Blood pressure is doing well now.  Taking her medication.  Wants to post pone mammogram secondary to covid.  Will notify me when agreeable to schedule.  No headache.  No light headedness.  Feels good.  Highest blood pressure reading lately - 129/63.     ROS: See pertinent positives and negatives per HPI.  Past Medical History:  Diagnosis Date  . Anemia   . Diverticulosis   . GERD (gastroesophageal reflux disease)   . Glaucoma   . Hypercholesterolemia   . Hypertension   . Thyroid nodule     Past Surgical History:  Procedure Laterality Date  . ABDOMINAL HYSTERECTOMY  1987   fibroids, ovaries not removed  . TONSILLECTOMY      Family History  Problem Relation Age of Onset  .  Diabetes Father   . Hypertension Father   . Hypercholesterolemia Father   . Heart disease Father        CABG  . Hypertension Mother   . Hypercholesterolemia Mother   . Rheumatic fever Mother   . Hypertension Brother   . Diabetes Sister   . Breast cancer Maternal Aunt   . Breast cancer Other        cousin  . Colon cancer Neg Hx     SOCIAL HX: reviewed.    Current Outpatient Medications:  .  acetaminophen (TYLENOL) 325 MG tablet, Take 650 mg by mouth every 4 (four) hours as needed., Disp: , Rfl:  .  Alpha-Lipoic Acid 200 MG CAPS, Take 200 mg by mouth daily., Disp: , Rfl:  .  Bioflavonoid Products (BIOFLEX PO), Take 1 tablet by mouth daily. , Disp: , Rfl:  .  clindamycin (CLEOCIN) 150 MG capsule, 1 capsule tid x 5 days, Disp: 15 capsule, Rfl: 0 .  fish oil-omega-3 fatty acids 1000 MG capsule, Take 1 g by mouth 3 (three) times daily., Disp: , Rfl:  .  Multiple Vitamin (MULTIVITAMIN) tablet, Take 1 tablet by mouth daily., Disp: , Rfl:  .  olmesartan (BENICAR) 40 MG tablet, Take 1 tablet (40 mg total) by mouth daily., Disp: 90 tablet, Rfl: 1 .  ondansetron (ZOFRAN ODT) 4 MG disintegrating tablet, Take 1 tablet (4 mg total) by mouth daily as needed for nausea or vomiting. (Patient not taking: Reported on 05/19/2018),  Disp: 20 tablet, Rfl: 0 .  spironolactone (ALDACTONE) 25 MG tablet, TAKE 1 TABLET BY MOUTH EVERY DAY, Disp: 90 tablet, Rfl: 1 .  Turmeric 500 MG TABS, Take 500 mg by mouth 3 (three) times daily., Disp: , Rfl:   EXAM:  VITALS per patient if applicable: 920/10  GENERAL: alert, oriented, appears well and in no acute distress  HEENT: atraumatic, conjunttiva clear, no obvious abnormalities on inspection of external nose and ears  NECK: normal movements of the head and neck  LUNGS: on inspection no signs of respiratory distress, breathing rate appears normal, no obvious gross SOB, gasping or wheezing  CV: no obvious cyanosis  PSYCH/NEURO: pleasant and cooperative, no  obvious depression or anxiety, speech and thought processing grossly intact  ASSESSMENT AND PLAN:  Discussed the following assessment and plan:  CKD (chronic kidney disease) Saw nephrology.  Continue to avoid antiinflammatories.  Follow metabolic panel.    Hypercholesterolemia Declines cholesterol medication.  Low cholesterol diet and exercise.  Follow lipid panel.    Hyperglycemia Low carb diet and exercise.  Follow met b and a1c.    Hypertension Blood pressure doing well as outlined.  Continue same medication regimen.  Follow pressures.  Follow metabolic panel.     I discussed the assessment and treatment plan with the patient. The patient was provided an opportunity to ask questions and all were answered. The patient agreed with the plan and demonstrated an understanding of the instructions.   The patient was advised to call back or seek an in-person evaluation if the symptoms worsen or if the condition fails to improve as anticipated.    Einar Pheasant, MD

## 2018-09-23 ENCOUNTER — Encounter: Payer: Self-pay | Admitting: Internal Medicine

## 2018-09-23 NOTE — Assessment & Plan Note (Signed)
Low carb diet and exercise.  Follow met b and a1c.   

## 2018-09-23 NOTE — Assessment & Plan Note (Signed)
Blood pressure doing well as outlined.  Continue same medication regimen.  Follow pressures.  Follow metabolic panel.

## 2018-09-23 NOTE — Assessment & Plan Note (Signed)
Declines cholesterol medication.  Low cholesterol diet and exercise.  Follow lipid panel.   

## 2018-09-23 NOTE — Assessment & Plan Note (Signed)
Saw nephrology.  Continue to avoid antiinflammatories.  Follow metabolic panel.

## 2018-10-17 ENCOUNTER — Other Ambulatory Visit (INDEPENDENT_AMBULATORY_CARE_PROVIDER_SITE_OTHER): Payer: PPO

## 2018-10-17 ENCOUNTER — Other Ambulatory Visit: Payer: Self-pay

## 2018-10-17 DIAGNOSIS — I10 Essential (primary) hypertension: Secondary | ICD-10-CM

## 2018-10-17 DIAGNOSIS — E7800 Pure hypercholesterolemia, unspecified: Secondary | ICD-10-CM | POA: Diagnosis not present

## 2018-10-17 DIAGNOSIS — R739 Hyperglycemia, unspecified: Secondary | ICD-10-CM | POA: Diagnosis not present

## 2018-10-17 LAB — LIPID PANEL
Cholesterol: 247 mg/dL — ABNORMAL HIGH (ref 0–200)
HDL: 39.4 mg/dL (ref >39.00)
NonHDL: 207.61
Total CHOL/HDL Ratio: 6
Triglycerides: 272 mg/dL — ABNORMAL HIGH (ref 0.0–149.0)
VLDL: 54.4 mg/dL — ABNORMAL HIGH (ref 0.0–40.0)

## 2018-10-17 LAB — CBC WITH DIFFERENTIAL/PLATELET
Basophils Absolute: 0.1 10*3/uL (ref 0.0–0.1)
Basophils Relative: 0.7 % (ref 0.0–3.0)
Eosinophils Absolute: 0.1 10*3/uL (ref 0.0–0.7)
Eosinophils Relative: 1.2 % (ref 0.0–5.0)
HCT: 38 % (ref 36.0–46.0)
Hemoglobin: 12.9 g/dL (ref 12.0–15.0)
Lymphocytes Relative: 23.9 % (ref 12.0–46.0)
Lymphs Abs: 2.4 10*3/uL (ref 0.7–4.0)
MCHC: 33.9 g/dL (ref 30.0–36.0)
MCV: 95 fl (ref 78.0–100.0)
Monocytes Absolute: 0.6 10*3/uL (ref 0.1–1.0)
Monocytes Relative: 5.7 % (ref 3.0–12.0)
Neutro Abs: 6.8 10*3/uL (ref 1.4–7.7)
Neutrophils Relative %: 68.5 % (ref 43.0–77.0)
Platelets: 261 10*3/uL (ref 150.0–400.0)
RBC: 4 Mil/uL (ref 3.87–5.11)
RDW: 14.4 % (ref 11.5–15.5)
WBC: 10 10*3/uL (ref 4.0–10.5)

## 2018-10-17 LAB — BASIC METABOLIC PANEL
BUN: 26 mg/dL — ABNORMAL HIGH (ref 6–23)
CO2: 21 mEq/L (ref 19–32)
Calcium: 9.8 mg/dL (ref 8.4–10.5)
Chloride: 103 mEq/L (ref 96–112)
Creatinine, Ser: 1.06 mg/dL (ref 0.40–1.20)
GFR: 49.86 mL/min — ABNORMAL LOW (ref >60.00)
Glucose, Bld: 97 mg/dL (ref 70–99)
Potassium: 4.7 mEq/L (ref 3.5–5.1)
Sodium: 135 mEq/L (ref 135–145)

## 2018-10-17 LAB — HEMOGLOBIN A1C: Hgb A1c MFr Bld: 5.9 % (ref 4.6–6.5)

## 2018-10-17 LAB — HEPATIC FUNCTION PANEL
ALT: 17 U/L (ref 0–35)
AST: 17 U/L (ref 0–37)
Albumin: 4.6 g/dL (ref 3.5–5.2)
Alkaline Phosphatase: 53 U/L (ref 39–117)
Bilirubin, Direct: 0 mg/dL (ref 0.0–0.3)
Total Bilirubin: 0.6 mg/dL (ref 0.2–1.2)
Total Protein: 7.5 g/dL (ref 6.0–8.3)

## 2018-10-17 LAB — LDL CHOLESTEROL, DIRECT: Direct LDL: 163 mg/dL

## 2018-10-17 LAB — TSH: TSH: 2.91 u[IU]/mL (ref 0.35–4.50)

## 2018-11-08 ENCOUNTER — Telehealth: Payer: Self-pay

## 2018-11-08 DIAGNOSIS — M25559 Pain in unspecified hip: Secondary | ICD-10-CM

## 2018-11-08 NOTE — Telephone Encounter (Signed)
Copied from [place] [id number]. Topic: Referral - Request for Referral >> Nov 08, 2018 11:37 AM Pauline Good wrote: Has patient seen PCP for this complaint? yes *If NO, is insurance requiring patient see PCP for this issue before PCP can refer them? Referral for which specialty: orthopedic  Preferred provider/office: Josephine Igo [place] # [phone] Reason for referral: Hip replacement

## 2018-11-13 NOTE — Telephone Encounter (Signed)
Please call and confirm with pt that she is just needed a referral for persistent hip pain.  I can place order, I just need to know if anything else is needed.

## 2018-11-15 NOTE — Telephone Encounter (Signed)
Referral is for persistent hip pain. Nothing else needed

## 2018-11-16 NOTE — Telephone Encounter (Signed)
Pt request referral to Dr Merlene Morse for persistent hip pain.  See phone message for details.  Thanks.    Dr Nicki Reaper

## 2018-11-29 ENCOUNTER — Other Ambulatory Visit: Payer: Self-pay | Admitting: Internal Medicine

## 2018-11-29 DIAGNOSIS — Z1231 Encounter for screening mammogram for malignant neoplasm of breast: Secondary | ICD-10-CM

## 2018-12-05 ENCOUNTER — Telehealth: Payer: Self-pay

## 2018-12-05 NOTE — Telephone Encounter (Signed)
Copied from [place] [id number]. Topic: General - Call Back - No Documentation >> Dec 05, 2018  2:17 PM Erick Blinks wrote: Reason for CRM: Pt is requesting call back from Nurse, per PCP request  Best contact: [phone]

## 2018-12-06 NOTE — Telephone Encounter (Signed)
Pt called to let me know that she has an appt with ortho Nov 6 for her hip pain. She is wondering if she could get an appt with someone here in [place]. Advised that I would see if referral could be sent to someone local. Also advised that our referral coordinator was out of the office and may take a few days to work on this. Pt was fine with that.

## 2018-12-14 NOTE — Telephone Encounter (Signed)
Referral sent to [facility]

## 2018-12-28 DIAGNOSIS — M25559 Pain in unspecified hip: Secondary | ICD-10-CM | POA: Diagnosis not present

## 2018-12-28 DIAGNOSIS — M1611 Unilateral primary osteoarthritis, right hip: Secondary | ICD-10-CM | POA: Diagnosis not present

## 2018-12-28 DIAGNOSIS — M25551 Pain in right hip: Secondary | ICD-10-CM | POA: Diagnosis not present

## 2019-01-01 ENCOUNTER — Ambulatory Visit (INDEPENDENT_AMBULATORY_CARE_PROVIDER_SITE_OTHER): Payer: PPO | Admitting: Internal Medicine

## 2019-01-01 ENCOUNTER — Other Ambulatory Visit: Payer: Self-pay

## 2019-01-01 DIAGNOSIS — E7800 Pure hypercholesterolemia, unspecified: Secondary | ICD-10-CM | POA: Diagnosis not present

## 2019-01-01 DIAGNOSIS — L989 Disorder of the skin and subcutaneous tissue, unspecified: Secondary | ICD-10-CM | POA: Diagnosis not present

## 2019-01-01 DIAGNOSIS — I10 Essential (primary) hypertension: Secondary | ICD-10-CM | POA: Diagnosis not present

## 2019-01-01 DIAGNOSIS — R739 Hyperglycemia, unspecified: Secondary | ICD-10-CM | POA: Diagnosis not present

## 2019-01-01 NOTE — Progress Notes (Signed)
Patient ID: Cathy Vazquez, female   DOB: Nov 16, 1940, 78 y.o.   MRN: [id number]   Subjective:    Patient ID: Cathy Vazquez, female    DOB: 07-20-1940, 78 y.o.   MRN: [id number]  HPI  Patient here for a scheduled follow up.  She reports she is doing well.  Feels good.  Staying active.  No chest pain.  No sob.  No acid reflux.  No abdominal pain.  Bowels moving.  Has lesion right arm.  Request referral to dermatology.  Blood pressure doing well.     Past Medical History:  Diagnosis Date  . Anemia   . Diverticulosis   . GERD (gastroesophageal reflux disease)   . Glaucoma   . Hypercholesterolemia   . Hypertension   . Thyroid nodule    Past Surgical History:  Procedure Laterality Date  . ABDOMINAL HYSTERECTOMY  1987   fibroids, ovaries not removed  . TONSILLECTOMY     Family History  Problem Relation Age of Onset  . Diabetes Father   . Hypertension Father   . Hypercholesterolemia Father   . Heart disease Father        CABG  . Hypertension Mother   . Hypercholesterolemia Mother   . Rheumatic fever Mother   . Hypertension Brother   . Diabetes Sister   . Breast cancer Maternal Aunt   . Breast cancer Other        cousin  . Colon cancer Neg Hx    Social History   Socioeconomic History  . Marital status: Divorced    Spouse name: Not on file  . Number of children: 2  . Years of education: Not on file  . Highest education level: Not on file  Occupational History  . Not on file  Social Needs  . Financial resource strain: Not hard at all  . Food insecurity    Worry: Never true    Inability: Never true  . Transportation needs    Medical: No    Non-medical: No  Tobacco Use  . Smoking status: Never Smoker  . Smokeless tobacco: Never Used  Substance and Sexual Activity  . Alcohol use: No    Alcohol/week: 0.0 standard drinks  . Drug use: No  . Sexual activity: Not Currently  Lifestyle  . Physical activity    Days per week: Not on file    Minutes per  session: Not on file  . Stress: Not on file  Relationships  . Social Herbalist on phone: Not on file    Gets together: Not on file    Attends religious service: Not on file    Active member of club or organization: Not on file    Attends meetings of clubs or organizations: Not on file    Relationship status: Not on file  Other Topics Concern  . Not on file  Social History Narrative  . Not on file    Outpatient Encounter Medications as of 01/01/2019  Medication Sig  . acetaminophen (TYLENOL) 325 MG tablet Take 650 mg by mouth every 4 (four) hours as needed.  . Alpha-Lipoic Acid 200 MG CAPS Take 200 mg by mouth daily.  Marland Kitchen Bioflavonoid Products (BIOFLEX PO) Take 1 tablet by mouth daily.   . clindamycin (CLEOCIN) 150 MG capsule 1 capsule tid x 5 days  . fish oil-omega-3 fatty acids 1000 MG capsule Take 1 g by mouth 3 (three) times daily.  . Multiple Vitamin (MULTIVITAMIN) tablet Take 1 tablet by  mouth daily.  Marland Kitchen olmesartan (BENICAR) 40 MG tablet Take 1 tablet (40 mg total) by mouth daily.  . ondansetron (ZOFRAN ODT) 4 MG disintegrating tablet Take 1 tablet (4 mg total) by mouth daily as needed for nausea or vomiting. (Patient not taking: Reported on 05/19/2018)  . spironolactone (ALDACTONE) 25 MG tablet TAKE 1 TABLET BY MOUTH EVERY DAY  . Turmeric 500 MG TABS Take 500 mg by mouth 3 (three) times daily.   No facility-administered encounter medications on file as of 01/01/2019.    Review of Systems  Constitutional: Negative for appetite change and unexpected weight change.  HENT: Negative for congestion and sinus pressure.   Respiratory: Negative for cough, chest tightness and shortness of breath.   Cardiovascular: Negative for chest pain, palpitations and leg swelling.  Gastrointestinal: Negative for abdominal pain, diarrhea, nausea and vomiting.  Genitourinary: Negative for difficulty urinating and dysuria.  Musculoskeletal: Negative for joint swelling and myalgias.  Skin:  Negative for color change and rash.  Neurological: Negative for dizziness, light-headedness and headaches.  Psychiatric/Behavioral: Negative for agitation and dysphoric mood.       Objective:    Physical Exam Constitutional:      General: She is not in acute distress.    Appearance: Normal appearance.  HENT:     Head: Normocephalic and atraumatic.     Right Ear: External ear normal.     Left Ear: External ear normal.  Eyes:     General: No scleral icterus.       Right eye: No discharge.        Left eye: No discharge.     Conjunctiva/sclera: Conjunctivae normal.  Neck:     Musculoskeletal: Neck supple. No muscular tenderness.     Thyroid: No thyromegaly.  Cardiovascular:     Rate and Rhythm: Normal rate and regular rhythm.  Pulmonary:     Effort: No respiratory distress.     Breath sounds: Normal breath sounds. No wheezing.  Abdominal:     General: Bowel sounds are normal.     Palpations: Abdomen is soft.     Tenderness: There is no abdominal tenderness.  Musculoskeletal:        General: No swelling or tenderness.  Lymphadenopathy:     Cervical: No cervical adenopathy.  Skin:    Findings: No erythema or rash.  Neurological:     Mental Status: She is alert.  Psychiatric:        Mood and Affect: Mood normal.        Behavior: Behavior normal.     BP 138/62   Pulse 90   Temp (!) 97 F (36.1 C)   Resp 16   Wt 164 lb 12.8 oz (74.8 kg)   LMP 02/21/1984   SpO2 98%   BMI 29.19 kg/m  Wt Readings from Last 3 Encounters:  01/01/19 164 lb 12.8 oz (74.8 kg)  05/19/18 166 lb (75.3 kg)  04/30/18 171 lb (77.6 kg)     Lab Results  Component Value Date   WBC 10.0 10/17/2018   HGB 12.9 10/17/2018   HCT 38.0 10/17/2018   PLT 261.0 10/17/2018   GLUCOSE 97 10/17/2018   CHOL 247 (H) 10/17/2018   TRIG 272.0 (H) 10/17/2018   HDL 39.40 10/17/2018   LDLDIRECT 163.0 10/17/2018   LDLCALC 107 (H) 05/15/2017   ALT 17 10/17/2018   AST 17 10/17/2018   NA 135 10/17/2018   K  4.7 10/17/2018   CL 103 10/17/2018   CREATININE 1.06 10/17/2018  BUN 26 (H) 10/17/2018   CO2 21 10/17/2018   TSH 2.91 10/17/2018   HGBA1C 5.9 10/17/2018    Dg Shoulder Right  Result Date: 05/19/2018 CLINICAL DATA:  Right shoulder pain after fall yesterday. EXAM: RIGHT SHOULDER - 2+ VIEW COMPARISON:  None. FINDINGS: There is no evidence of fracture or dislocation. Mild degenerative changes seen involving the right acromioclavicular joint. Soft tissues are unremarkable. IMPRESSION: Mild degenerative joint disease of right acromioclavicular joint. No acute abnormality seen in the right shoulder Electronically Signed   By: Marijo Conception, M.D.   On: 05/19/2018 11:29       Assessment & Plan:   Problem List Items Addressed This Visit    Hypercholesterolemia    Declines cholesterol medication.  Low cholesterol diet and exercise. Follow lipid panel.       Relevant Orders   Hepatic function panel   Lipid panel   Hyperglycemia    Low carb diet and exercise.  Follow met b and a1c.       Relevant Orders   Hemoglobin A1c   Hypertension    Blood pressure under good control.  Continue same medication regimen.  Follow pressures.  Follow metabolic panel.        Relevant Orders   Basic metabolic panel (future)   Skin lesion of right arm    Persistent.  Refer to dermatology.       Relevant Orders   Ambulatory referral to Dermatology       Einar Pheasant, MD

## 2019-01-05 ENCOUNTER — Encounter: Payer: Self-pay | Admitting: Internal Medicine

## 2019-01-05 NOTE — Assessment & Plan Note (Signed)
Declines cholesterol medication.  Low cholesterol diet and exercise.  Follow lipid panel.   

## 2019-01-05 NOTE — Assessment & Plan Note (Signed)
Blood pressure under good control.  Continue same medication regimen.  Follow pressures.  Follow metabolic panel.   

## 2019-01-05 NOTE — Assessment & Plan Note (Signed)
Persistent.  Refer to dermatology.   

## 2019-01-05 NOTE — Assessment & Plan Note (Signed)
Low carb diet and exercise.  Follow met b and a1c.  

## 2019-01-15 DIAGNOSIS — M9904 Segmental and somatic dysfunction of sacral region: Secondary | ICD-10-CM | POA: Diagnosis not present

## 2019-01-15 DIAGNOSIS — M9905 Segmental and somatic dysfunction of pelvic region: Secondary | ICD-10-CM | POA: Diagnosis not present

## 2019-01-15 DIAGNOSIS — M5417 Radiculopathy, lumbosacral region: Secondary | ICD-10-CM | POA: Diagnosis not present

## 2019-01-15 DIAGNOSIS — M9903 Segmental and somatic dysfunction of lumbar region: Secondary | ICD-10-CM | POA: Diagnosis not present

## 2019-01-22 ENCOUNTER — Other Ambulatory Visit: Payer: Self-pay | Admitting: Internal Medicine

## 2019-01-23 ENCOUNTER — Other Ambulatory Visit: Payer: Self-pay

## 2019-01-25 ENCOUNTER — Other Ambulatory Visit: Payer: Self-pay

## 2019-01-25 ENCOUNTER — Other Ambulatory Visit (INDEPENDENT_AMBULATORY_CARE_PROVIDER_SITE_OTHER): Payer: PPO

## 2019-01-25 DIAGNOSIS — R739 Hyperglycemia, unspecified: Secondary | ICD-10-CM | POA: Diagnosis not present

## 2019-01-25 DIAGNOSIS — E7800 Pure hypercholesterolemia, unspecified: Secondary | ICD-10-CM | POA: Diagnosis not present

## 2019-01-25 DIAGNOSIS — I10 Essential (primary) hypertension: Secondary | ICD-10-CM

## 2019-01-25 LAB — HEPATIC FUNCTION PANEL
ALT: 16 U/L (ref 0–35)
AST: 15 U/L (ref 0–37)
Albumin: 4.4 g/dL (ref 3.5–5.2)
Alkaline Phosphatase: 63 U/L (ref 39–117)
Bilirubin, Direct: 0.1 mg/dL (ref 0.0–0.3)
Total Bilirubin: 0.6 mg/dL (ref 0.2–1.2)
Total Protein: 7.4 g/dL (ref 6.0–8.3)

## 2019-01-25 LAB — BASIC METABOLIC PANEL
BUN: 26 mg/dL — ABNORMAL HIGH (ref 6–23)
CO2: 23 mEq/L (ref 19–32)
Calcium: 9.5 mg/dL (ref 8.4–10.5)
Chloride: 100 mEq/L (ref 96–112)
Creatinine, Ser: 1.11 mg/dL (ref 0.40–1.20)
GFR: 47.24 mL/min — ABNORMAL LOW (ref >60.00)
Glucose, Bld: 101 mg/dL — ABNORMAL HIGH (ref 70–99)
Potassium: 4.7 mEq/L (ref 3.5–5.1)
Sodium: 134 mEq/L — ABNORMAL LOW (ref 135–145)

## 2019-01-25 LAB — LIPID PANEL
Cholesterol: 240 mg/dL — ABNORMAL HIGH (ref 0–200)
HDL: 36.5 mg/dL — ABNORMAL LOW (ref >39.00)
LDL Cholesterol: 165 mg/dL — ABNORMAL HIGH (ref 0–99)
NonHDL: 203.58
Total CHOL/HDL Ratio: 7
Triglycerides: 192 mg/dL — ABNORMAL HIGH (ref 0.0–149.0)
VLDL: 38.4 mg/dL (ref 0.0–40.0)

## 2019-01-25 LAB — HEMOGLOBIN A1C: Hgb A1c MFr Bld: 5.7 % (ref 4.6–6.5)

## 2019-01-28 ENCOUNTER — Ambulatory Visit (INDEPENDENT_AMBULATORY_CARE_PROVIDER_SITE_OTHER): Payer: PPO | Admitting: Internal Medicine

## 2019-01-28 ENCOUNTER — Other Ambulatory Visit: Payer: Self-pay

## 2019-01-28 ENCOUNTER — Encounter: Payer: Self-pay | Admitting: Internal Medicine

## 2019-01-28 DIAGNOSIS — R739 Hyperglycemia, unspecified: Secondary | ICD-10-CM | POA: Diagnosis not present

## 2019-01-28 DIAGNOSIS — I10 Essential (primary) hypertension: Secondary | ICD-10-CM | POA: Diagnosis not present

## 2019-01-28 DIAGNOSIS — N1831 Chronic kidney disease, stage 3a: Secondary | ICD-10-CM

## 2019-01-28 DIAGNOSIS — E7800 Pure hypercholesterolemia, unspecified: Secondary | ICD-10-CM

## 2019-01-28 MED ORDER — OLMESARTAN MEDOXOMIL 40 MG PO TABS: 40.0000 mg | ORAL_TABLET | Freq: Every day | ORAL | 1 refills | Status: DC

## 2019-01-28 MED ORDER — SPIRONOLACTONE 25 MG PO TABS: 25.0000 mg | ORAL_TABLET | Freq: Every day | ORAL | 1 refills | Status: DC

## 2019-01-28 NOTE — Assessment & Plan Note (Signed)
Saw nephrology.  Continue to avoid antiinflammatories.  GFR 47 - stable.  Follow metabolic panel.

## 2019-01-28 NOTE — Assessment & Plan Note (Signed)
Blood pressure under good control.  Continue same medication regimen.  Follow pressures.  Follow metabolic panel.   

## 2019-01-28 NOTE — Progress Notes (Signed)
Patient ID: Cathy Vazquez, female   DOB: 1940/05/01, 78 y.o.   MRN: [id number]   Virtual Visit via video Note  This visit type was conducted due to national recommendations for restrictions regarding the COVID-19 pandemic (e.g. social distancing).  This format is felt to be most appropriate for this patient at this time.  All issues noted in this document were discussed and addressed.  No physical exam was performed (except for noted visual exam findings with Video Visits).   I connected with Cathy Vazquez by a video enabled telemedicine application and verified that I am speaking with the correct person using two identifiers. Location patient: home Location provider: work Persons participating in the virtual visit: patient, provider  I discussed the limitations, risks, security and privacy concerns of performing an evaluation and management service by video and the availability of in person appointments.  The patient expressed understanding and agreed to proceed.   Reason for visit: scheduled follow up.   HPI: She reports she is doing well.  Feels good.  Trying to stay active.  Active around her house.  She is staying in more due to covid restrictions.  No chest pain.  No sob.  No acid reflux.  No abdominal pain.  Bowels moving.  Blood pressure doing well.  Handling stress.  Family - supportive.  Discussed labs.  Discussed starting a cholesterol medication.  She continues to decline.     ROS: See pertinent positives and negatives per HPI.  Past Medical History:  Diagnosis Date  . Anemia   . Diverticulosis   . GERD (gastroesophageal reflux disease)   . Glaucoma   . Hypercholesterolemia   . Hypertension   . Thyroid nodule     Past Surgical History:  Procedure Laterality Date  . ABDOMINAL HYSTERECTOMY  1987   fibroids, ovaries not removed  . TONSILLECTOMY      Family History  Problem Relation Age of Onset  . Diabetes Father   . Hypertension Father   . Hypercholesterolemia  Father   . Heart disease Father        CABG  . Hypertension Mother   . Hypercholesterolemia Mother   . Rheumatic fever Mother   . Hypertension Brother   . Diabetes Sister   . Breast cancer Maternal Aunt   . Breast cancer Other        cousin  . Colon cancer Neg Hx     SOCIAL HX: reviewed.    Current Outpatient Medications:  .  acetaminophen (TYLENOL) 325 MG tablet, Take 650 mg by mouth every 4 (four) hours as needed., Disp: , Rfl:  .  Alpha-Lipoic Acid 200 MG CAPS, Take 200 mg by mouth daily., Disp: , Rfl:  .  Bioflavonoid Products (BIOFLEX PO), Take 1 tablet by mouth daily. , Disp: , Rfl:  .  fish oil-omega-3 fatty acids 1000 MG capsule, Take 1 g by mouth 3 (three) times daily., Disp: , Rfl:  .  Multiple Vitamin (MULTIVITAMIN) tablet, Take 1 tablet by mouth daily., Disp: , Rfl:  .  olmesartan (BENICAR) 40 MG tablet, Take 1 tablet (40 mg total) by mouth daily., Disp: 90 tablet, Rfl: 1 .  spironolactone (ALDACTONE) 25 MG tablet, Take 1 tablet (25 mg total) by mouth daily., Disp: 90 tablet, Rfl: 1 .  Turmeric 500 MG TABS, Take 500 mg by mouth 3 (three) times daily., Disp: , Rfl:   EXAM:  VITALS per patient if applicable:  132/44  GENERAL: alert, oriented, appears well and in no  acute distress  HEENT: atraumatic, conjunttiva clear, no obvious abnormalities on inspection of external nose and ears  NECK: normal movements of the head and neck  LUNGS: on inspection no signs of respiratory distress, breathing rate appears normal, no obvious gross SOB, gasping or wheezing  CV: no obvious cyanosis  PSYCH/NEURO: pleasant and cooperative, no obvious depression or anxiety, speech and thought processing grossly intact  ASSESSMENT AND PLAN:  Discussed the following assessment and plan:  Hypercholesterolemia Discussed her cholesterol labs and discussed recommendation for starting a cholesterol medication.  She continues to decline.  Diet and exercise.  Follow lipid panel.    CKD  (chronic kidney disease) Saw nephrology.  Continue to avoid antiinflammatories.  GFR 47 - stable.  Follow metabolic panel.   Hyperglycemia Low carb diet and exercise.  She has decreased her sweet intake.  a1c 5.7 - improved.  Follow met b and a1c.   Hypertension Blood pressure under good control.  Continue same medication regimen.  Follow pressures.  Follow metabolic panel.      I discussed the assessment and treatment plan with the patient. The patient was provided an opportunity to ask questions and all were answered. The patient agreed with the plan and demonstrated an understanding of the instructions.   The patient was advised to call back or seek an in-person evaluation if the symptoms worsen or if the condition fails to improve as anticipated.   Einar Pheasant, MD

## 2019-01-28 NOTE — Assessment & Plan Note (Signed)
Low carb diet and exercise.  She has decreased her sweet intake.  a1c 5.7 - improved.  Follow met b and a1c.

## 2019-01-28 NOTE — Assessment & Plan Note (Signed)
Discussed her cholesterol labs and discussed recommendation for starting a cholesterol medication.  She continues to decline.  Diet and exercise.  Follow lipid panel.

## 2019-01-31 DIAGNOSIS — M9905 Segmental and somatic dysfunction of pelvic region: Secondary | ICD-10-CM | POA: Diagnosis not present

## 2019-01-31 DIAGNOSIS — M5442 Lumbago with sciatica, left side: Secondary | ICD-10-CM | POA: Diagnosis not present

## 2019-01-31 DIAGNOSIS — M9903 Segmental and somatic dysfunction of lumbar region: Secondary | ICD-10-CM | POA: Diagnosis not present

## 2019-01-31 DIAGNOSIS — M9904 Segmental and somatic dysfunction of sacral region: Secondary | ICD-10-CM | POA: Diagnosis not present

## 2019-02-25 DIAGNOSIS — M4306 Spondylolysis, lumbar region: Secondary | ICD-10-CM | POA: Diagnosis not present

## 2019-02-25 DIAGNOSIS — M9903 Segmental and somatic dysfunction of lumbar region: Secondary | ICD-10-CM | POA: Diagnosis not present

## 2019-02-25 DIAGNOSIS — M9905 Segmental and somatic dysfunction of pelvic region: Secondary | ICD-10-CM | POA: Diagnosis not present

## 2019-02-25 DIAGNOSIS — M5431 Sciatica, right side: Secondary | ICD-10-CM | POA: Diagnosis not present

## 2019-02-26 DIAGNOSIS — M9903 Segmental and somatic dysfunction of lumbar region: Secondary | ICD-10-CM | POA: Diagnosis not present

## 2019-02-26 DIAGNOSIS — M9905 Segmental and somatic dysfunction of pelvic region: Secondary | ICD-10-CM | POA: Diagnosis not present

## 2019-02-26 DIAGNOSIS — M4306 Spondylolysis, lumbar region: Secondary | ICD-10-CM | POA: Diagnosis not present

## 2019-02-26 DIAGNOSIS — M5431 Sciatica, right side: Secondary | ICD-10-CM | POA: Diagnosis not present

## 2019-03-04 DIAGNOSIS — M9903 Segmental and somatic dysfunction of lumbar region: Secondary | ICD-10-CM | POA: Diagnosis not present

## 2019-03-04 DIAGNOSIS — M9905 Segmental and somatic dysfunction of pelvic region: Secondary | ICD-10-CM | POA: Diagnosis not present

## 2019-03-04 DIAGNOSIS — M5431 Sciatica, right side: Secondary | ICD-10-CM | POA: Diagnosis not present

## 2019-03-04 DIAGNOSIS — M4306 Spondylolysis, lumbar region: Secondary | ICD-10-CM | POA: Diagnosis not present

## 2019-03-06 DIAGNOSIS — M4306 Spondylolysis, lumbar region: Secondary | ICD-10-CM | POA: Diagnosis not present

## 2019-03-06 DIAGNOSIS — M9903 Segmental and somatic dysfunction of lumbar region: Secondary | ICD-10-CM | POA: Diagnosis not present

## 2019-03-06 DIAGNOSIS — M5431 Sciatica, right side: Secondary | ICD-10-CM | POA: Diagnosis not present

## 2019-03-06 DIAGNOSIS — M9905 Segmental and somatic dysfunction of pelvic region: Secondary | ICD-10-CM | POA: Diagnosis not present

## 2019-03-08 DIAGNOSIS — M9905 Segmental and somatic dysfunction of pelvic region: Secondary | ICD-10-CM | POA: Diagnosis not present

## 2019-03-08 DIAGNOSIS — M4306 Spondylolysis, lumbar region: Secondary | ICD-10-CM | POA: Diagnosis not present

## 2019-03-08 DIAGNOSIS — M5431 Sciatica, right side: Secondary | ICD-10-CM | POA: Diagnosis not present

## 2019-03-08 DIAGNOSIS — M9903 Segmental and somatic dysfunction of lumbar region: Secondary | ICD-10-CM | POA: Diagnosis not present

## 2019-03-11 DIAGNOSIS — M5431 Sciatica, right side: Secondary | ICD-10-CM | POA: Diagnosis not present

## 2019-03-11 DIAGNOSIS — M9905 Segmental and somatic dysfunction of pelvic region: Secondary | ICD-10-CM | POA: Diagnosis not present

## 2019-03-11 DIAGNOSIS — M9903 Segmental and somatic dysfunction of lumbar region: Secondary | ICD-10-CM | POA: Diagnosis not present

## 2019-03-11 DIAGNOSIS — M4306 Spondylolysis, lumbar region: Secondary | ICD-10-CM | POA: Diagnosis not present

## 2019-03-13 DIAGNOSIS — M5431 Sciatica, right side: Secondary | ICD-10-CM | POA: Diagnosis not present

## 2019-03-13 DIAGNOSIS — M9903 Segmental and somatic dysfunction of lumbar region: Secondary | ICD-10-CM | POA: Diagnosis not present

## 2019-03-13 DIAGNOSIS — M9905 Segmental and somatic dysfunction of pelvic region: Secondary | ICD-10-CM | POA: Diagnosis not present

## 2019-03-13 DIAGNOSIS — M4306 Spondylolysis, lumbar region: Secondary | ICD-10-CM | POA: Diagnosis not present

## 2019-03-15 DIAGNOSIS — M9905 Segmental and somatic dysfunction of pelvic region: Secondary | ICD-10-CM | POA: Diagnosis not present

## 2019-03-15 DIAGNOSIS — M5431 Sciatica, right side: Secondary | ICD-10-CM | POA: Diagnosis not present

## 2019-03-15 DIAGNOSIS — M9903 Segmental and somatic dysfunction of lumbar region: Secondary | ICD-10-CM | POA: Diagnosis not present

## 2019-03-15 DIAGNOSIS — M4306 Spondylolysis, lumbar region: Secondary | ICD-10-CM | POA: Diagnosis not present

## 2019-03-19 DIAGNOSIS — M5431 Sciatica, right side: Secondary | ICD-10-CM | POA: Diagnosis not present

## 2019-03-19 DIAGNOSIS — M9903 Segmental and somatic dysfunction of lumbar region: Secondary | ICD-10-CM | POA: Diagnosis not present

## 2019-03-19 DIAGNOSIS — M4306 Spondylolysis, lumbar region: Secondary | ICD-10-CM | POA: Diagnosis not present

## 2019-03-19 DIAGNOSIS — M9905 Segmental and somatic dysfunction of pelvic region: Secondary | ICD-10-CM | POA: Diagnosis not present

## 2019-03-22 DIAGNOSIS — M9905 Segmental and somatic dysfunction of pelvic region: Secondary | ICD-10-CM | POA: Diagnosis not present

## 2019-03-22 DIAGNOSIS — M9903 Segmental and somatic dysfunction of lumbar region: Secondary | ICD-10-CM | POA: Diagnosis not present

## 2019-03-22 DIAGNOSIS — M4306 Spondylolysis, lumbar region: Secondary | ICD-10-CM | POA: Diagnosis not present

## 2019-03-22 DIAGNOSIS — M5431 Sciatica, right side: Secondary | ICD-10-CM | POA: Diagnosis not present

## 2019-03-25 DIAGNOSIS — M4306 Spondylolysis, lumbar region: Secondary | ICD-10-CM | POA: Diagnosis not present

## 2019-03-25 DIAGNOSIS — M5431 Sciatica, right side: Secondary | ICD-10-CM | POA: Diagnosis not present

## 2019-03-25 DIAGNOSIS — M9903 Segmental and somatic dysfunction of lumbar region: Secondary | ICD-10-CM | POA: Diagnosis not present

## 2019-03-25 DIAGNOSIS — M9905 Segmental and somatic dysfunction of pelvic region: Secondary | ICD-10-CM | POA: Diagnosis not present

## 2019-03-27 DIAGNOSIS — M5431 Sciatica, right side: Secondary | ICD-10-CM | POA: Diagnosis not present

## 2019-03-27 DIAGNOSIS — M9903 Segmental and somatic dysfunction of lumbar region: Secondary | ICD-10-CM | POA: Diagnosis not present

## 2019-03-27 DIAGNOSIS — M4306 Spondylolysis, lumbar region: Secondary | ICD-10-CM | POA: Diagnosis not present

## 2019-03-27 DIAGNOSIS — M9905 Segmental and somatic dysfunction of pelvic region: Secondary | ICD-10-CM | POA: Diagnosis not present

## 2019-04-01 DIAGNOSIS — M9905 Segmental and somatic dysfunction of pelvic region: Secondary | ICD-10-CM | POA: Diagnosis not present

## 2019-04-01 DIAGNOSIS — M5431 Sciatica, right side: Secondary | ICD-10-CM | POA: Diagnosis not present

## 2019-04-01 DIAGNOSIS — M9903 Segmental and somatic dysfunction of lumbar region: Secondary | ICD-10-CM | POA: Diagnosis not present

## 2019-04-01 DIAGNOSIS — M4306 Spondylolysis, lumbar region: Secondary | ICD-10-CM | POA: Diagnosis not present

## 2019-04-03 DIAGNOSIS — M9905 Segmental and somatic dysfunction of pelvic region: Secondary | ICD-10-CM | POA: Diagnosis not present

## 2019-04-03 DIAGNOSIS — M5431 Sciatica, right side: Secondary | ICD-10-CM | POA: Diagnosis not present

## 2019-04-03 DIAGNOSIS — M9903 Segmental and somatic dysfunction of lumbar region: Secondary | ICD-10-CM | POA: Diagnosis not present

## 2019-04-03 DIAGNOSIS — M4306 Spondylolysis, lumbar region: Secondary | ICD-10-CM | POA: Diagnosis not present

## 2019-04-08 DIAGNOSIS — M9905 Segmental and somatic dysfunction of pelvic region: Secondary | ICD-10-CM | POA: Diagnosis not present

## 2019-04-08 DIAGNOSIS — M5431 Sciatica, right side: Secondary | ICD-10-CM | POA: Diagnosis not present

## 2019-04-08 DIAGNOSIS — M4306 Spondylolysis, lumbar region: Secondary | ICD-10-CM | POA: Diagnosis not present

## 2019-04-08 DIAGNOSIS — M9903 Segmental and somatic dysfunction of lumbar region: Secondary | ICD-10-CM | POA: Diagnosis not present

## 2019-04-10 DIAGNOSIS — M5431 Sciatica, right side: Secondary | ICD-10-CM | POA: Diagnosis not present

## 2019-04-10 DIAGNOSIS — M9905 Segmental and somatic dysfunction of pelvic region: Secondary | ICD-10-CM | POA: Diagnosis not present

## 2019-04-10 DIAGNOSIS — M9903 Segmental and somatic dysfunction of lumbar region: Secondary | ICD-10-CM | POA: Diagnosis not present

## 2019-04-10 DIAGNOSIS — M4306 Spondylolysis, lumbar region: Secondary | ICD-10-CM | POA: Diagnosis not present

## 2019-04-15 DIAGNOSIS — M5431 Sciatica, right side: Secondary | ICD-10-CM | POA: Diagnosis not present

## 2019-04-15 DIAGNOSIS — M9903 Segmental and somatic dysfunction of lumbar region: Secondary | ICD-10-CM | POA: Diagnosis not present

## 2019-04-15 DIAGNOSIS — M4306 Spondylolysis, lumbar region: Secondary | ICD-10-CM | POA: Diagnosis not present

## 2019-04-15 DIAGNOSIS — M9905 Segmental and somatic dysfunction of pelvic region: Secondary | ICD-10-CM | POA: Diagnosis not present

## 2019-04-17 ENCOUNTER — Ambulatory Visit
Admission: RE | Admit: 2019-04-17 | Discharge: 2019-04-17 | Disposition: A | Discharge status: Home / Self Care | Payer: PPO | Source: Ambulatory Visit | Attending: Internal Medicine | Admitting: Internal Medicine

## 2019-04-17 ENCOUNTER — Other Ambulatory Visit: Payer: Self-pay | Admitting: Internal Medicine

## 2019-04-17 DIAGNOSIS — N6489 Other specified disorders of breast: Secondary | ICD-10-CM

## 2019-04-17 DIAGNOSIS — R928 Other abnormal and inconclusive findings on diagnostic imaging of breast: Secondary | ICD-10-CM

## 2019-04-17 DIAGNOSIS — Z1231 Encounter for screening mammogram for malignant neoplasm of breast: Secondary | ICD-10-CM | POA: Insufficient documentation

## 2019-04-22 ENCOUNTER — Telehealth: Payer: Self-pay | Admitting: Internal Medicine

## 2019-04-22 DIAGNOSIS — M9903 Segmental and somatic dysfunction of lumbar region: Secondary | ICD-10-CM | POA: Diagnosis not present

## 2019-04-22 DIAGNOSIS — M4306 Spondylolysis, lumbar region: Secondary | ICD-10-CM | POA: Diagnosis not present

## 2019-04-22 DIAGNOSIS — M9905 Segmental and somatic dysfunction of pelvic region: Secondary | ICD-10-CM | POA: Diagnosis not present

## 2019-04-22 DIAGNOSIS — M5431 Sciatica, right side: Secondary | ICD-10-CM | POA: Diagnosis not present

## 2019-04-22 NOTE — Telephone Encounter (Signed)
Pt wants a callback. She didn't disclose why.

## 2019-04-22 NOTE — Telephone Encounter (Signed)
Pt calling to ask why she was needing another mammogram. Pt couldn't remember what I told her last week. She just needs additional views and aware that they should be contacting to set up. Pt aware. Advised to call back if she has any other questions.

## 2019-04-26 ENCOUNTER — Telehealth: Payer: Self-pay | Admitting: Internal Medicine

## 2019-04-26 DIAGNOSIS — M4726 Other spondylosis with radiculopathy, lumbar region: Secondary | ICD-10-CM | POA: Diagnosis not present

## 2019-04-26 DIAGNOSIS — M1611 Unilateral primary osteoarthritis, right hip: Secondary | ICD-10-CM | POA: Diagnosis not present

## 2019-04-26 NOTE — Telephone Encounter (Signed)
Left message to return call. Needing to know why patient is requesting referral. Could not find a previous note on the matter.

## 2019-04-26 NOTE — Telephone Encounter (Signed)
Pt called she is wanting a referral to a Spine Specialist

## 2019-05-01 ENCOUNTER — Ambulatory Visit: Payer: PPO

## 2019-05-02 ENCOUNTER — Other Ambulatory Visit: Payer: PPO

## 2019-05-02 ENCOUNTER — Telehealth: Payer: Self-pay | Admitting: Internal Medicine

## 2019-05-02 ENCOUNTER — Ambulatory Visit: Payer: PPO

## 2019-05-02 NOTE — Telephone Encounter (Signed)
Pt would like a call from nurse to give some information to Dr. Nicki Reaper.  Pt would not go into detail with me and said it was lengthy/  Please advise  CB number [phone]

## 2019-05-03 NOTE — Telephone Encounter (Signed)
Does she need pre op evaluation prior to surgery. Please go ahead and schedule.  See me before calling

## 2019-05-03 NOTE — Telephone Encounter (Signed)
Patient stated that she is scheduled to see the surgeon about her hip on 4/15 to discuss surgery. Wanted Dr Nicki Reaper to be aware. Also the surgeon is recommending a referral to a spine specialist. Surgeon is going to place referral for spine specialist. This is just an Fyi to keep Dr Nicki Reaper aware of what is going on.

## 2019-05-03 NOTE — Telephone Encounter (Signed)
No referral needed. Confirmed with pt.

## 2019-05-06 DIAGNOSIS — M9905 Segmental and somatic dysfunction of pelvic region: Secondary | ICD-10-CM | POA: Diagnosis not present

## 2019-05-06 DIAGNOSIS — M4306 Spondylolysis, lumbar region: Secondary | ICD-10-CM | POA: Diagnosis not present

## 2019-05-06 DIAGNOSIS — M5431 Sciatica, right side: Secondary | ICD-10-CM | POA: Diagnosis not present

## 2019-05-06 DIAGNOSIS — M9903 Segmental and somatic dysfunction of lumbar region: Secondary | ICD-10-CM | POA: Diagnosis not present

## 2019-05-07 NOTE — Telephone Encounter (Signed)
Ok to hold if she has no idea when surgery will be scheduled.

## 2019-05-07 NOTE — Telephone Encounter (Signed)
Her surgery has not been scheduled yet. She doesn't meet with them until 4/15 to discuss. Do you want me to hold on scheduling pre-op until she meets with them to discuss having surgery? Just wanted to confirm

## 2019-05-08 ENCOUNTER — Other Ambulatory Visit: Payer: Self-pay

## 2019-05-08 ENCOUNTER — Ambulatory Visit (INDEPENDENT_AMBULATORY_CARE_PROVIDER_SITE_OTHER): Payer: PPO

## 2019-05-08 VITALS — Ht 63.0 in | Wt 163.0 lb

## 2019-05-08 DIAGNOSIS — Z0000 Encounter for general adult medical examination without abnormal findings: Secondary | ICD-10-CM

## 2019-05-08 NOTE — Progress Notes (Addendum)
Subjective:   Cathy Vazquez is a 79 y.o. female who presents for Medicare Annual (Subsequent) preventive examination.  Review of Systems:  No ROS.  Medicare Wellness Virtual Visit.  Visual/audio telehealth visit, UTA vital signs.   Ht/Wt provided.  See social history for additional risk factors.   Cardiac Risk Factors include: advanced age (>18men, >61 women);hypertension     Objective:     Vitals: Ht 5\' 3"  (1.6 m)   Wt 163 lb (73.9 kg)   LMP 02/21/1984   BMI 28.87 kg/m   Body mass index is 28.87 kg/m.  Advanced Directives 05/08/2019 05/19/2018 04/30/2018 05/09/2017 04/26/2017 04/25/2016 02/26/2015  Does Patient Have a Medical Advance Directive? Yes Yes Yes No;Yes Yes Yes Yes  Type of Paramedic of [place];Living will Healthcare Power of [place];Living will Healthcare Power of [place];Living will Living will;Healthcare Power of [place];Living will  Does patient want to make changes to medical advance directive? No - Patient declined No - Patient declined No - Patient declined No - Patient declined No - Patient declined No - Patient declined No - Patient declined  Copy of [place] in Chart? Yes - validated most recent copy scanned in chart (See row information) Yes - validated most recent copy scanned in chart (See row information) Yes - validated most recent copy scanned in chart (See row information) Yes Yes Yes No - copy requested  Would patient like information on creating a medical advance directive? - - - No - Patient declined - - -    Tobacco Social History   Tobacco Use  Smoking Status Never Smoker  Smokeless Tobacco Never Used     Counseling given: Not Answered   Clinical Intake:  Pre-visit preparation completed: Yes        Diabetes: No  How often do you need to have someone help you when you read instructions, pamphlets, or  other written materials from your doctor or pharmacy?: 1 - Never  Interpreter Needed?: No     Past Medical History:  Diagnosis Date  . Anemia   . Diverticulosis   . GERD (gastroesophageal reflux disease)   . Glaucoma   . Hypercholesterolemia   . Hypertension   . Thyroid nodule    Past Surgical History:  Procedure Laterality Date  . ABDOMINAL HYSTERECTOMY  1987   fibroids, ovaries not removed  . TONSILLECTOMY     Family History  Problem Relation Age of Onset  . Diabetes Father   . Hypertension Father   . Hypercholesterolemia Father   . Heart disease Father        CABG  . Hypertension Mother   . Hypercholesterolemia Mother   . Rheumatic fever Mother   . Hypertension Brother   . Diabetes Sister   . Breast cancer Maternal Aunt   . Breast cancer Cousin   . Colon cancer Neg Hx    Social History   Socioeconomic History  . Marital status: Divorced    Spouse name: Not on file  . Number of children: 2  . Years of education: Not on file  . Highest education level: Not on file  Occupational History  . Not on file  Tobacco Use  . Smoking status: Never Smoker  . Smokeless tobacco: Never Used  Substance and Sexual Activity  . Alcohol use: No    Alcohol/week: 0.0 standard drinks  . Drug use: No  . Sexual activity: Not  Currently  Other Topics Concern  . Not on file  Social History Narrative  . Not on file   Social Determinants of Health   Financial Resource Strain:   . Difficulty of Paying Living Expenses:   Food Insecurity:   . Worried About Charity fundraiser in the Last Year:   . Arboriculturist in the Last Year:   Transportation Needs:   . Film/video editor (Medical):   Marland Kitchen Lack of Transportation (Non-Medical):   Physical Activity:   . Days of Exercise per Week:   . Minutes of Exercise per Session:   Stress:   . Feeling of Stress :   Social Connections:   . Frequency of Communication with Friends and Family:   . Frequency of Social Gatherings with  Friends and Family:   . Attends Religious Services:   . Active Member of Clubs or Organizations:   . Attends Archivist Meetings:   Marland Kitchen Marital Status:     Outpatient Encounter Medications as of 05/08/2019  Medication Sig  . acetaminophen (TYLENOL) 325 MG tablet Take 650 mg by mouth every 4 (four) hours as needed.  . Alpha-Lipoic Acid 200 MG CAPS Take 200 mg by mouth daily.  Marland Kitchen Bioflavonoid Products (BIOFLEX PO) Take 1 tablet by mouth daily.   . fish oil-omega-3 fatty acids 1000 MG capsule Take 1 g by mouth 3 (three) times daily.  . Multiple Vitamin (MULTIVITAMIN) tablet Take 1 tablet by mouth daily.  Marland Kitchen olmesartan (BENICAR) 40 MG tablet Take 1 tablet (40 mg total) by mouth daily.  Marland Kitchen spironolactone (ALDACTONE) 25 MG tablet Take 1 tablet (25 mg total) by mouth daily.  . Turmeric 500 MG TABS Take 500 mg by mouth 3 (three) times daily.   No facility-administered encounter medications on file as of 05/08/2019.    Activities of Daily Living In your present state of health, do you have any difficulty performing the following activities: 05/08/2019  Hearing? N  Vision? N  Difficulty concentrating or making decisions? N  Walking or climbing stairs? Y  Comment chronic R hip pain  Dressing or bathing? N  Doing errands, shopping? N  Preparing Food and eating ? N  Using the Toilet? N  In the past six months, have you accidently leaked urine? N  Do you have problems with loss of bowel control? N  Managing your Medications? N  Managing your Finances? N  Housekeeping or managing your Housekeeping? Y  Comment Daughter assists  Some recent data might be hidden    Patient Care Team: Einar Pheasant, MD as PCP - General (Internal Medicine)    Assessment:   This is a routine wellness examination for Logyn.  Nurse connected with patient 05/08/19 at  1:00 PM EDT by a telephone enabled telemedicine application and verified that I am speaking with the correct person using two identifiers.  Patient stated full name and DOB. Patient gave permission to continue with virtual visit. Patient's location was at home and Nurse's location was at [place] office.   Patient is alert and oriented x3. Patient denies difficulty focusing or concentrating. Patient likes to do math in her head, watches online cognitive information and enjoys cooking with new recipes for brain stimulation.   Health Maintenance Due: -Mammogram- scheduled 05/10/2019 See completed HM at the end of note.   Eye: Visual acuity not assessed. Virtual visit. Followed by their ophthalmologist.  Dental: Visits every 6 months.    Hearing: Demonstrates normal hearing during visit.  Safety:  Patient feels safe at home- yes Patient does have smoke detectors at home- yes Patient does wear sunscreen or protective clothing when in direct sunlight - yes Patient does wear seat belt when in a moving vehicle - yes Patient drives- yes Adequate lighting in walkways free from debris- yes Grab bars and handrails used as appropriate- yes Ambulates with an assistive device- yes; cane Cell phone on person when ambulating outside of the home- yes  Social: Alcohol intake - no     Smoking history- never   Smokers in home? none Illicit drug use? none  Medication: Taking as directed and without issues.  Self managed - yes   Covid-19: Precautions and sickness symptoms discussed. Wears mask, social distancing, hand hygiene as appropriate.   Activities of Daily Living Patient denies needing assistance with: household chores, feeding themselves, getting from bed to chair, getting to the toilet, bathing/showering, dressing, managing money, or preparing meals.   Discussed the importance of a healthy diet, water intake and the benefits of aerobic exercise.   Physical activity- limited due to chronic R hip pain. Stretches. Chiropractor visits once monthly. See care everywhere [facility] for upcoming hip surgery update; visit  04/26/19.  Diet:  Regular Water: good intake Caffeine: very limited  Other Providers Patient Care Team: Einar Pheasant, MD as PCP - General (Internal Medicine)  Exercise Activities and Dietary recommendations Current Exercise Habits: Home exercise routine, Type of exercise: stretching, Intensity: Mild  Goals    . Follow up with Primary Care Provider     As needed       Fall Risk Fall Risk  05/08/2019 04/30/2018 04/26/2017 04/25/2016 01/11/2016  Falls in the past year? 0 0 No No No  Number falls in past yr: - - - - -  Follow up Falls evaluation completed - - - -   Timed Get Up and Go performed: no, virtual visit  Depression Screen PHQ 2/9 Scores 05/08/2019 04/30/2018 04/26/2017 04/25/2016  PHQ - 2 Score 0 0 0 0     Cognitive Function MMSE - Mini Mental State Exam 04/26/2017 04/25/2016 02/26/2015  Orientation to time 5 5 5   Orientation to Place 5 5 5   Registration 3 3 3   Attention/ Calculation 5 5 5   Recall 3 3 3   Language- name 2 objects 2 2 2   Language- repeat 1 1 1   Language- follow 3 step command 3 3 3   Language- read & follow direction 1 1 1   Write a sentence 1 1 1   Copy design 1 1 1   Total score 30 30 30      6CIT Screen 05/08/2019 04/30/2018  What Year? 0 points 0 points  What month? 0 points 0 points  What time? 0 points 0 points  Count back from 20 0 points 0 points  Months in reverse 0 points 0 points  Repeat phrase 0 points 0 points  Total Score 0 0    Immunization History  Administered Date(s) Administered  . Influenza Inj Mdck Quad Pf 05/01/2018  . Influenza Split 10/29/2012  . Influenza, High Dose Seasonal PF 01/05/2017, 12/13/2018  . Pneumococcal Conjugate-13 10/29/2012  . Tdap 05/19/2018  . Zoster 02/21/2009   Screening Tests Health Maintenance  Topic Date Due  . MAMMOGRAM  04/16/2020  . TETANUS/TDAP  05/18/2028  . DEXA SCAN  Completed  . INFLUENZA VACCINE  Discontinued  . PNA vac Low Risk Adult  Discontinued      Plan:   Keep all routine  maintenance appointments.  Next scheduled fasting lab 06/07/19 @ 9:00  Cpe 06/11/19 @ 1:30  Medicare Attestation I have personally reviewed: The patient's medical and social history Their use of alcohol, tobacco or illicit drugs Their current medications and supplements The patient's functional ability including ADLs,fall risks, home safety risks, cognitive, and hearing and visual impairment Diet and physical activities Evidence for depression   I have reviewed and discussed with patient certain preventive protocols, quality metrics, and best practice recommendations.   Varney Biles, LPN  [id number]   Reviewed above information.  Agree with assessment and plan.   Dr Nicki Reaper

## 2019-05-08 NOTE — Telephone Encounter (Signed)
Noted will f/u with patient after she meets with surgery

## 2019-05-08 NOTE — Patient Instructions (Addendum)
  Ms. Henige , Thank you for taking time to come for your Medicare Wellness Visit. I appreciate your ongoing commitment to your health goals. Please review the following plan we discussed and let me know if I can assist you in the future.   These are the goals we discussed: Goals    . Follow up with Primary Care Provider     As needed       This is a list of the screening recommended for you and due dates:  Health Maintenance  Topic Date Due  . Mammogram  04/16/2020  . Tetanus Vaccine  05/18/2028  . DEXA scan (bone density measurement)  Completed  . Flu Shot  Discontinued  . Pneumonia vaccines  Discontinued

## 2019-05-10 ENCOUNTER — Ambulatory Visit
Admission: RE | Admit: 2019-05-10 | Discharge: 2019-05-10 | Disposition: A | Discharge status: Home / Self Care | Payer: PPO | Source: Ambulatory Visit | Attending: Internal Medicine | Admitting: Internal Medicine

## 2019-05-10 DIAGNOSIS — R928 Other abnormal and inconclusive findings on diagnostic imaging of breast: Secondary | ICD-10-CM

## 2019-05-10 DIAGNOSIS — N6489 Other specified disorders of breast: Secondary | ICD-10-CM | POA: Diagnosis not present

## 2019-05-10 DIAGNOSIS — N6001 Solitary cyst of right breast: Secondary | ICD-10-CM | POA: Diagnosis not present

## 2019-05-10 DIAGNOSIS — R922 Inconclusive mammogram: Secondary | ICD-10-CM | POA: Diagnosis not present

## 2019-05-10 DIAGNOSIS — N6002 Solitary cyst of left breast: Secondary | ICD-10-CM | POA: Diagnosis not present

## 2019-06-03 DIAGNOSIS — M4306 Spondylolysis, lumbar region: Secondary | ICD-10-CM | POA: Diagnosis not present

## 2019-06-03 DIAGNOSIS — M9905 Segmental and somatic dysfunction of pelvic region: Secondary | ICD-10-CM | POA: Diagnosis not present

## 2019-06-03 DIAGNOSIS — M5431 Sciatica, right side: Secondary | ICD-10-CM | POA: Diagnosis not present

## 2019-06-03 DIAGNOSIS — M9903 Segmental and somatic dysfunction of lumbar region: Secondary | ICD-10-CM | POA: Diagnosis not present

## 2019-06-06 DIAGNOSIS — M1611 Unilateral primary osteoarthritis, right hip: Secondary | ICD-10-CM | POA: Diagnosis not present

## 2019-06-07 ENCOUNTER — Other Ambulatory Visit (INDEPENDENT_AMBULATORY_CARE_PROVIDER_SITE_OTHER): Payer: PPO

## 2019-06-07 ENCOUNTER — Other Ambulatory Visit: Payer: Self-pay

## 2019-06-07 DIAGNOSIS — I10 Essential (primary) hypertension: Secondary | ICD-10-CM

## 2019-06-07 DIAGNOSIS — E7800 Pure hypercholesterolemia, unspecified: Secondary | ICD-10-CM | POA: Diagnosis not present

## 2019-06-07 DIAGNOSIS — N1831 Chronic kidney disease, stage 3a: Secondary | ICD-10-CM

## 2019-06-07 DIAGNOSIS — R739 Hyperglycemia, unspecified: Secondary | ICD-10-CM | POA: Diagnosis not present

## 2019-06-07 LAB — BASIC METABOLIC PANEL
BUN: 25 mg/dL — ABNORMAL HIGH (ref 6–23)
CO2: 23 mEq/L (ref 19–32)
Calcium: 9.3 mg/dL (ref 8.4–10.5)
Chloride: 102 mEq/L (ref 96–112)
Creatinine, Ser: 1.07 mg/dL (ref 0.40–1.20)
GFR: 49.24 mL/min — ABNORMAL LOW (ref >60.00)
Glucose, Bld: 110 mg/dL — ABNORMAL HIGH (ref 70–99)
Potassium: 5.3 mEq/L — ABNORMAL HIGH (ref 3.5–5.1)
Sodium: 132 mEq/L — ABNORMAL LOW (ref 135–145)

## 2019-06-07 LAB — HEPATIC FUNCTION PANEL
ALT: 16 U/L (ref 0–35)
AST: 15 U/L (ref 0–37)
Albumin: 4.2 g/dL (ref 3.5–5.2)
Alkaline Phosphatase: 61 U/L (ref 39–117)
Bilirubin, Direct: 0.1 mg/dL (ref 0.0–0.3)
Total Bilirubin: 0.4 mg/dL (ref 0.2–1.2)
Total Protein: 7 g/dL (ref 6.0–8.3)

## 2019-06-07 LAB — LIPID PANEL
Cholesterol: 238 mg/dL — ABNORMAL HIGH (ref 0–200)
HDL: 40.8 mg/dL (ref >39.00)
NonHDL: 197.22
Total CHOL/HDL Ratio: 6
Triglycerides: 243 mg/dL — ABNORMAL HIGH (ref 0.0–149.0)
VLDL: 48.6 mg/dL — ABNORMAL HIGH (ref 0.0–40.0)

## 2019-06-07 LAB — TSH: TSH: 2.48 u[IU]/mL (ref 0.35–4.50)

## 2019-06-07 LAB — HEMOGLOBIN A1C: Hgb A1c MFr Bld: 6.1 % (ref 4.6–6.5)

## 2019-06-07 LAB — LDL CHOLESTEROL, DIRECT: Direct LDL: 160 mg/dL

## 2019-06-11 ENCOUNTER — Encounter: Payer: Self-pay | Admitting: Internal Medicine

## 2019-06-11 ENCOUNTER — Ambulatory Visit (INDEPENDENT_AMBULATORY_CARE_PROVIDER_SITE_OTHER): Payer: PPO | Admitting: Internal Medicine

## 2019-06-11 ENCOUNTER — Other Ambulatory Visit: Payer: Self-pay

## 2019-06-11 ENCOUNTER — Other Ambulatory Visit: Payer: Self-pay | Admitting: Internal Medicine

## 2019-06-11 VITALS — BP 146/78 | HR 96 | Temp 98.2°F | Resp 16 | Ht 63.0 in | Wt 169.4 lb

## 2019-06-11 DIAGNOSIS — E7800 Pure hypercholesterolemia, unspecified: Secondary | ICD-10-CM | POA: Diagnosis not present

## 2019-06-11 DIAGNOSIS — E042 Nontoxic multinodular goiter: Secondary | ICD-10-CM

## 2019-06-11 DIAGNOSIS — E875 Hyperkalemia: Secondary | ICD-10-CM | POA: Diagnosis not present

## 2019-06-11 DIAGNOSIS — M25551 Pain in right hip: Secondary | ICD-10-CM | POA: Diagnosis not present

## 2019-06-11 DIAGNOSIS — Z0000 Encounter for general adult medical examination without abnormal findings: Secondary | ICD-10-CM

## 2019-06-11 DIAGNOSIS — I10 Essential (primary) hypertension: Secondary | ICD-10-CM

## 2019-06-11 DIAGNOSIS — N1831 Chronic kidney disease, stage 3a: Secondary | ICD-10-CM

## 2019-06-11 DIAGNOSIS — R739 Hyperglycemia, unspecified: Secondary | ICD-10-CM

## 2019-06-11 LAB — POTASSIUM: Potassium: 5 mEq/L (ref 3.5–5.1)

## 2019-06-11 NOTE — Progress Notes (Signed)
Patient ID: Cathy Vazquez, female   DOB: 06/05/1940, 79 y.o.   MRN: [id number]   Subjective:    Patient ID: Cathy Vazquez, female    DOB: 11-11-1940, 79 y.o.   MRN: [id number]  HPI This visit occurred during the SARS-CoV-2 public health emergency.  Safety protocols were in place, including screening questions prior to the visit, additional usage of staff PPE, and extensive cleaning of exam room while observing appropriate contact time as indicated for disinfecting solutions.  Patient here for her physical exam.  She reports she is doing relatively well.  She has been having right hip and groin pain and describes some pain down her right leg.  Saw ortho - Dr Merrilyn Puma.  Felt to have right hip OA, but noted to have a component of radicular pain.  Planning for intra- articular steroid injection scheduled for 07/10/19.  She is to then contact ortho after two weeks and notify if injection provided significant relief.  If so, then may be candidate for total hip arthroplasty.  If no relief, then would recommend spine w/up.  The pain is limiting her activity.  Taking tylenol.  Helps.  No chest pain or sob.  No acid reflux.  No abdominal pain.  Bowels moving.    Past Medical History:  Diagnosis Date  . Anemia   . Diverticulosis   . GERD (gastroesophageal reflux disease)   . Glaucoma   . Hypercholesterolemia   . Hypertension   . Thyroid nodule    Past Surgical History:  Procedure Laterality Date  . ABDOMINAL HYSTERECTOMY  1987   fibroids, ovaries not removed  . TONSILLECTOMY     Family History  Problem Relation Age of Onset  . Diabetes Father   . Hypertension Father   . Hypercholesterolemia Father   . Heart disease Father        CABG  . Hypertension Mother   . Hypercholesterolemia Mother   . Rheumatic fever Mother   . Hypertension Brother   . Diabetes Sister   . Breast cancer Maternal Aunt   . Breast cancer Cousin   . Colon cancer Neg Hx    Social History   Socioeconomic  History  . Marital status: Divorced    Spouse name: Not on file  . Number of children: 2  . Years of education: Not on file  . Highest education level: Not on file  Occupational History  . Not on file  Tobacco Use  . Smoking status: Never Smoker  . Smokeless tobacco: Never Used  Substance and Sexual Activity  . Alcohol use: No    Alcohol/week: 0.0 standard drinks  . Drug use: No  . Sexual activity: Not Currently  Other Topics Concern  . Not on file  Social History Narrative  . Not on file   Social Determinants of Health   Financial Resource Strain:   . Difficulty of Paying Living Expenses:   Food Insecurity:   . Worried About Charity fundraiser in the Last Year:   . Arboriculturist in the Last Year:   Transportation Needs:   . Film/video editor (Medical):   Marland Kitchen Lack of Transportation (Non-Medical):   Physical Activity:   . Days of Exercise per Week:   . Minutes of Exercise per Session:   Stress:   . Feeling of Stress :   Social Connections:   . Frequency of Communication with Friends and Family:   . Frequency of Social Gatherings with Friends and Family:   .  Attends Religious Services:   . Active Member of Clubs or Organizations:   . Attends Archivist Meetings:   Marland Kitchen Marital Status:     Outpatient Encounter Medications as of 06/11/2019  Medication Sig  . acetaminophen (TYLENOL) 325 MG tablet Take 650 mg by mouth every 4 (four) hours as needed.  . Alpha-Lipoic Acid 200 MG CAPS Take 200 mg by mouth daily.  Marland Kitchen Bioflavonoid Products (BIOFLEX PO) Take 1 tablet by mouth daily.   . fish oil-omega-3 fatty acids 1000 MG capsule Take 1 g by mouth 3 (three) times daily.  . Multiple Vitamin (MULTIVITAMIN) tablet Take 1 tablet by mouth daily.  Marland Kitchen olmesartan (BENICAR) 40 MG tablet Take 1 tablet (40 mg total) by mouth daily.  Marland Kitchen spironolactone (ALDACTONE) 25 MG tablet Take 1 tablet (25 mg total) by mouth daily.  . Turmeric 500 MG TABS Take 500 mg by mouth 3 (three)  times daily.   No facility-administered encounter medications on file as of 06/11/2019.   Review of Systems  Constitutional: Negative for appetite change and unexpected weight change.  HENT: Negative for congestion and sinus pressure.   Eyes: Negative for pain and visual disturbance.  Respiratory: Negative for cough, chest tightness and shortness of breath.   Cardiovascular: Negative for chest pain, palpitations and leg swelling.  Gastrointestinal: Negative for abdominal pain, diarrhea, nausea and vomiting.  Genitourinary: Negative for difficulty urinating and dysuria.  Musculoskeletal: Negative for joint swelling and myalgias.  Skin: Negative for color change and rash.  Neurological: Negative for dizziness, light-headedness and headaches.  Hematological: Negative for adenopathy. Does not bruise/bleed easily.  Psychiatric/Behavioral: Negative for agitation and dysphoric mood.       Objective:    Physical Exam Constitutional:      General: She is not in acute distress.    Appearance: Normal appearance. She is well-developed.  HENT:     Head: Normocephalic and atraumatic.     Right Ear: External ear normal.     Left Ear: External ear normal.  Eyes:     General: No scleral icterus.       Right eye: No discharge.        Left eye: No discharge.     Conjunctiva/sclera: Conjunctivae normal.  Neck:     Thyroid: No thyromegaly.  Cardiovascular:     Rate and Rhythm: Normal rate and regular rhythm.  Pulmonary:     Effort: No tachypnea, accessory muscle usage or respiratory distress.     Breath sounds: Normal breath sounds. No decreased breath sounds or wheezing.  Chest:     Breasts:        Right: No inverted nipple, mass, nipple discharge or tenderness (no axillary adenopathy).        Left: No inverted nipple, mass, nipple discharge or tenderness (no axilarry adenopathy).  Abdominal:     General: Bowel sounds are normal.     Palpations: Abdomen is soft.     Tenderness: There is  no abdominal tenderness.  Musculoskeletal:        General: No swelling or tenderness.     Cervical back: Neck supple. No tenderness.  Lymphadenopathy:     Cervical: No cervical adenopathy.  Skin:    General: Skin is warm.     Findings: No erythema or rash.  Neurological:     Mental Status: She is alert and oriented to person, place, and time.  Psychiatric:        Mood and Affect: Mood normal.  Behavior: Behavior normal.     BP (!) 146/78   Pulse 96   Temp 98.2 F (36.8 C)   Resp 16   Ht '5\' 3"'$  (1.6 m)   Wt 169 lb 6.4 oz (76.8 kg)   LMP 02/21/1984   SpO2 97%   BMI 30.01 kg/m  Wt Readings from Last 3 Encounters:  06/11/19 169 lb 6.4 oz (76.8 kg)  05/08/19 163 lb (73.9 kg)  01/28/19 163 lb (73.9 kg)     Lab Results  Component Value Date   WBC 10.0 10/17/2018   HGB 12.9 10/17/2018   HCT 38.0 10/17/2018   PLT 261.0 10/17/2018   GLUCOSE 110 (H) 06/07/2019   CHOL 238 (H) 06/07/2019   TRIG 243.0 (H) 06/07/2019   HDL 40.80 06/07/2019   LDLDIRECT 160.0 06/07/2019   LDLCALC 165 (H) 01/25/2019   ALT 16 06/07/2019   AST 15 06/07/2019   NA 132 (L) 06/07/2019   K 5.0 06/11/2019   CL 102 06/07/2019   CREATININE 1.07 06/07/2019   BUN 25 (H) 06/07/2019   CO2 23 06/07/2019   TSH 2.48 06/07/2019   HGBA1C 6.1 06/07/2019    US BREAST LTD UNI LEFT INC AXILLA  Result Date: 05/10/2019 CLINICAL DATA:  Possible mass in the 12 o'clock position of the right breast posteriorly and possible mass in the upper outer left breast posteriorly on a recent screening mammogram. EXAM: DIGITAL DIAGNOSTIC BILATERAL MAMMOGRAM WITH TOMO ULTRASOUND BILATERAL BREAST COMPARISON:  Previous exam(s). ACR Breast Density Category c: The breast tissue is heterogeneously dense, which may obscure small masses. FINDINGS: 3D tomographic and 2D generated spot compression images of both breasts demonstrate an approximately 1 cm oval, partially circumscribed and partially obscured mass in the superior aspect  of right breast in the oblique projection, not seen in the craniocaudal projection. There is also an approximately 1 cm oval, partially circumscribed and partially obscured mass in the posterior aspect of the upper-outer left breast in approximately the 2 to 3 o'clock position. Targeted ultrasound is performed, showing a 1.4 cm cyst containing a small amount of internal echoes and partial thin internal septation in the 11:30 o'clock position of the right breast, 3 cm from the nipple, corresponding to the mammographic mass. A 9 mm simple cyst is demonstrated in the 2 o'clock position of the left breast, 8 cm from the nipple, corresponding to the mammographic mass. There is also a nearby 6 mm cyst containing a thin partial internal septation. IMPRESSION: Benign bilateral breast cysts.  No evidence of malignancy. RECOMMENDATION: Bilateral screening mammogram in 1 year. I have discussed the findings and recommendations with the patient. If applicable, a reminder letter will be sent to the patient regarding the next appointment. BI-RADS CATEGORY  2: Benign. Electronically Signed   By: Claudie Revering M.D.   On: 05/10/2019 14:37   US BREAST LTD UNI RIGHT INC AXILLA  Result Date: 05/10/2019 CLINICAL DATA:  Possible mass in the 12 o'clock position of the right breast posteriorly and possible mass in the upper outer left breast posteriorly on a recent screening mammogram. EXAM: DIGITAL DIAGNOSTIC BILATERAL MAMMOGRAM WITH TOMO ULTRASOUND BILATERAL BREAST COMPARISON:  Previous exam(s). ACR Breast Density Category c: The breast tissue is heterogeneously dense, which may obscure small masses. FINDINGS: 3D tomographic and 2D generated spot compression images of both breasts demonstrate an approximately 1 cm oval, partially circumscribed and partially obscured mass in the superior aspect of right breast in the oblique projection, not seen in the craniocaudal projection. There  is also an approximately 1 cm oval, partially  circumscribed and partially obscured mass in the posterior aspect of the upper-outer left breast in approximately the 2 to 3 o'clock position. Targeted ultrasound is performed, showing a 1.4 cm cyst containing a small amount of internal echoes and partial thin internal septation in the 11:30 o'clock position of the right breast, 3 cm from the nipple, corresponding to the mammographic mass. A 9 mm simple cyst is demonstrated in the 2 o'clock position of the left breast, 8 cm from the nipple, corresponding to the mammographic mass. There is also a nearby 6 mm cyst containing a thin partial internal septation. IMPRESSION: Benign bilateral breast cysts.  No evidence of malignancy. RECOMMENDATION: Bilateral screening mammogram in 1 year. I have discussed the findings and recommendations with the patient. If applicable, a reminder letter will be sent to the patient regarding the next appointment. BI-RADS CATEGORY  2: Benign. Electronically Signed   By: Claudie Revering M.D.   On: 05/10/2019 14:37   MM DIAG BREAST TOMO BILATERAL  Result Date: 05/10/2019 CLINICAL DATA:  Possible mass in the 12 o'clock position of the right breast posteriorly and possible mass in the upper outer left breast posteriorly on a recent screening mammogram. EXAM: DIGITAL DIAGNOSTIC BILATERAL MAMMOGRAM WITH TOMO ULTRASOUND BILATERAL BREAST COMPARISON:  Previous exam(s). ACR Breast Density Category c: The breast tissue is heterogeneously dense, which may obscure small masses. FINDINGS: 3D tomographic and 2D generated spot compression images of both breasts demonstrate an approximately 1 cm oval, partially circumscribed and partially obscured mass in the superior aspect of right breast in the oblique projection, not seen in the craniocaudal projection. There is also an approximately 1 cm oval, partially circumscribed and partially obscured mass in the posterior aspect of the upper-outer left breast in approximately the 2 to 3 o'clock position.  Targeted ultrasound is performed, showing a 1.4 cm cyst containing a small amount of internal echoes and partial thin internal septation in the 11:30 o'clock position of the right breast, 3 cm from the nipple, corresponding to the mammographic mass. A 9 mm simple cyst is demonstrated in the 2 o'clock position of the left breast, 8 cm from the nipple, corresponding to the mammographic mass. There is also a nearby 6 mm cyst containing a thin partial internal septation. IMPRESSION: Benign bilateral breast cysts.  No evidence of malignancy. RECOMMENDATION: Bilateral screening mammogram in 1 year. I have discussed the findings and recommendations with the patient. If applicable, a reminder letter will be sent to the patient regarding the next appointment. BI-RADS CATEGORY  2: Benign. Electronically Signed   By: Claudie Revering M.D.   On: 05/10/2019 14:37       Assessment & Plan:   Problem List Items Addressed This Visit    CKD (chronic kidney disease)    Seeing nephrology.  Continue to avoid antiinflammatories.  Follow metabolic panel.       Health care maintenance    Physical today 06/11/19.  Colonoscopy 09/2006.  Barium ok.  Discussed f/u colonoscopy/cologuard.  She wants to hold at this time.  Follow.  Mammogram - bilateral diagnostic - Birads I.        Hypercholesterolemia    She declines to start cholesterol medication.  Low cholesterol diet and exercise.  Follow lipid panel.       Relevant Orders   Hepatic function panel   Lipid panel   Hyperglycemia    Low carb diet and exercise.  Follow met b and a1c.  Relevant Orders   Hemoglobin A1c   Hypertension    Blood pressure on recheck improved.  Her checks - ok.  Continue current medication regimen.  Follow pressures.  Follow metabolic panel.       Relevant Orders   CBC with Differential/Platelet   Basic metabolic panel   Multiple thyroid nodules    Has seen Dr Eddie Dibbles.  Follow thyroid function tests.        Right hip pain    Right  hip/groin and right leg pain as outlined.  Seeing ortho.  Planning for injection in May.  If improves, plans to discuss hip surgery.  If no relief, refer to spine specialist.         Other Visit Diagnoses    Hyperkalemia    -  Primary   Relevant Orders   Potassium (Completed)       Einar Pheasant, MD

## 2019-06-11 NOTE — Assessment & Plan Note (Addendum)
Physical today 06/11/19.  Colonoscopy 09/2006.  Barium ok.  Discussed f/u colonoscopy/cologuard.  She wants to hold at this time.  Follow.  Mammogram - bilateral diagnostic - Birads I.

## 2019-06-11 NOTE — Progress Notes (Signed)
Order placed for f/u lab.   

## 2019-06-16 ENCOUNTER — Encounter: Payer: Self-pay | Admitting: Internal Medicine

## 2019-06-16 DIAGNOSIS — M25551 Pain in right hip: Secondary | ICD-10-CM | POA: Insufficient documentation

## 2019-06-16 NOTE — Assessment & Plan Note (Signed)
Seeing nephrology.  Continue to avoid antiinflammatories.  Follow metabolic panel.  

## 2019-06-16 NOTE — Assessment & Plan Note (Signed)
She declines to start cholesterol medication.  Low cholesterol diet and exercise.  Follow lipid panel.

## 2019-06-16 NOTE — Assessment & Plan Note (Signed)
Right hip/groin and right leg pain as outlined.  Seeing ortho.  Planning for injection in May.  If improves, plans to discuss hip surgery.  If no relief, refer to spine specialist.

## 2019-06-16 NOTE — Assessment & Plan Note (Signed)
Has seen Dr Eddie Dibbles.  Follow thyroid function tests.

## 2019-06-16 NOTE — Assessment & Plan Note (Signed)
Blood pressure on recheck improved.  Her checks - ok.  Continue current medication regimen.  Follow pressures.  Follow metabolic panel.

## 2019-06-16 NOTE — Assessment & Plan Note (Signed)
Low carb diet and exercise.  Follow met b and a1c.  

## 2019-07-01 DIAGNOSIS — M4306 Spondylolysis, lumbar region: Secondary | ICD-10-CM | POA: Diagnosis not present

## 2019-07-01 DIAGNOSIS — M5431 Sciatica, right side: Secondary | ICD-10-CM | POA: Diagnosis not present

## 2019-07-01 DIAGNOSIS — M9903 Segmental and somatic dysfunction of lumbar region: Secondary | ICD-10-CM | POA: Diagnosis not present

## 2019-07-01 DIAGNOSIS — M9905 Segmental and somatic dysfunction of pelvic region: Secondary | ICD-10-CM | POA: Diagnosis not present

## 2019-07-08 DIAGNOSIS — M4306 Spondylolysis, lumbar region: Secondary | ICD-10-CM | POA: Diagnosis not present

## 2019-07-08 DIAGNOSIS — M5431 Sciatica, right side: Secondary | ICD-10-CM | POA: Diagnosis not present

## 2019-07-08 DIAGNOSIS — M9903 Segmental and somatic dysfunction of lumbar region: Secondary | ICD-10-CM | POA: Diagnosis not present

## 2019-07-08 DIAGNOSIS — M9905 Segmental and somatic dysfunction of pelvic region: Secondary | ICD-10-CM | POA: Diagnosis not present

## 2019-07-10 DIAGNOSIS — M1611 Unilateral primary osteoarthritis, right hip: Secondary | ICD-10-CM | POA: Diagnosis not present

## 2019-07-23 DIAGNOSIS — M9903 Segmental and somatic dysfunction of lumbar region: Secondary | ICD-10-CM | POA: Diagnosis not present

## 2019-07-23 DIAGNOSIS — M4306 Spondylolysis, lumbar region: Secondary | ICD-10-CM | POA: Diagnosis not present

## 2019-07-23 DIAGNOSIS — M9905 Segmental and somatic dysfunction of pelvic region: Secondary | ICD-10-CM | POA: Diagnosis not present

## 2019-07-23 DIAGNOSIS — M5431 Sciatica, right side: Secondary | ICD-10-CM | POA: Diagnosis not present

## 2019-07-29 ENCOUNTER — Other Ambulatory Visit: Payer: Self-pay | Admitting: Internal Medicine

## 2019-08-05 DIAGNOSIS — M5431 Sciatica, right side: Secondary | ICD-10-CM | POA: Diagnosis not present

## 2019-08-05 DIAGNOSIS — M9903 Segmental and somatic dysfunction of lumbar region: Secondary | ICD-10-CM | POA: Diagnosis not present

## 2019-08-05 DIAGNOSIS — M9905 Segmental and somatic dysfunction of pelvic region: Secondary | ICD-10-CM | POA: Diagnosis not present

## 2019-08-05 DIAGNOSIS — M4306 Spondylolysis, lumbar region: Secondary | ICD-10-CM | POA: Diagnosis not present

## 2019-08-15 DIAGNOSIS — M545 Low back pain: Secondary | ICD-10-CM | POA: Diagnosis not present

## 2019-08-15 DIAGNOSIS — M47816 Spondylosis without myelopathy or radiculopathy, lumbar region: Secondary | ICD-10-CM | POA: Diagnosis not present

## 2019-08-15 DIAGNOSIS — M25551 Pain in right hip: Secondary | ICD-10-CM | POA: Diagnosis not present

## 2019-08-15 DIAGNOSIS — R2689 Other abnormalities of gait and mobility: Secondary | ICD-10-CM | POA: Diagnosis not present

## 2019-08-15 DIAGNOSIS — G8929 Other chronic pain: Secondary | ICD-10-CM | POA: Diagnosis not present

## 2019-08-30 DIAGNOSIS — M1611 Unilateral primary osteoarthritis, right hip: Secondary | ICD-10-CM | POA: Diagnosis not present

## 2019-09-04 DIAGNOSIS — M1611 Unilateral primary osteoarthritis, right hip: Secondary | ICD-10-CM | POA: Diagnosis not present

## 2019-09-09 DIAGNOSIS — M1611 Unilateral primary osteoarthritis, right hip: Secondary | ICD-10-CM | POA: Diagnosis not present

## 2019-09-13 DIAGNOSIS — M1611 Unilateral primary osteoarthritis, right hip: Secondary | ICD-10-CM | POA: Diagnosis not present

## 2019-09-17 DIAGNOSIS — M1611 Unilateral primary osteoarthritis, right hip: Secondary | ICD-10-CM | POA: Diagnosis not present

## 2019-09-20 DIAGNOSIS — M1611 Unilateral primary osteoarthritis, right hip: Secondary | ICD-10-CM | POA: Diagnosis not present

## 2019-09-23 DIAGNOSIS — M1611 Unilateral primary osteoarthritis, right hip: Secondary | ICD-10-CM | POA: Diagnosis not present

## 2019-09-26 DIAGNOSIS — M1611 Unilateral primary osteoarthritis, right hip: Secondary | ICD-10-CM | POA: Diagnosis not present

## 2019-09-30 DIAGNOSIS — M1611 Unilateral primary osteoarthritis, right hip: Secondary | ICD-10-CM | POA: Diagnosis not present

## 2019-10-03 DIAGNOSIS — M1611 Unilateral primary osteoarthritis, right hip: Secondary | ICD-10-CM | POA: Diagnosis not present

## 2019-10-10 DIAGNOSIS — M1611 Unilateral primary osteoarthritis, right hip: Secondary | ICD-10-CM | POA: Diagnosis not present

## 2019-10-15 ENCOUNTER — Other Ambulatory Visit (INDEPENDENT_AMBULATORY_CARE_PROVIDER_SITE_OTHER): Payer: PPO

## 2019-10-15 ENCOUNTER — Telehealth: Payer: Self-pay | Admitting: Internal Medicine

## 2019-10-15 ENCOUNTER — Other Ambulatory Visit: Payer: Self-pay

## 2019-10-15 DIAGNOSIS — R739 Hyperglycemia, unspecified: Secondary | ICD-10-CM

## 2019-10-15 DIAGNOSIS — E7800 Pure hypercholesterolemia, unspecified: Secondary | ICD-10-CM | POA: Diagnosis not present

## 2019-10-15 DIAGNOSIS — I10 Essential (primary) hypertension: Secondary | ICD-10-CM

## 2019-10-15 LAB — BASIC METABOLIC PANEL
BUN: 28 mg/dL — ABNORMAL HIGH (ref 6–23)
CO2: 22 mEq/L (ref 19–32)
Calcium: 9.5 mg/dL (ref 8.4–10.5)
Chloride: 103 mEq/L (ref 96–112)
Creatinine, Ser: 1.12 mg/dL (ref 0.40–1.20)
GFR: 46.67 mL/min — ABNORMAL LOW (ref >60.00)
Glucose, Bld: 118 mg/dL — ABNORMAL HIGH (ref 70–99)
Potassium: 4.8 mEq/L (ref 3.5–5.1)
Sodium: 134 mEq/L — ABNORMAL LOW (ref 135–145)

## 2019-10-15 LAB — CBC WITH DIFFERENTIAL/PLATELET
Basophils Absolute: 0.1 10*3/uL (ref 0.0–0.1)
Basophils Relative: 1.1 % (ref 0.0–3.0)
Eosinophils Absolute: 0.2 10*3/uL (ref 0.0–0.7)
Eosinophils Relative: 1.7 % (ref 0.0–5.0)
HCT: 37.2 % (ref 36.0–46.0)
Hemoglobin: 12.3 g/dL (ref 12.0–15.0)
Lymphocytes Relative: 26.3 % (ref 12.0–46.0)
Lymphs Abs: 3.1 10*3/uL (ref 0.7–4.0)
MCHC: 33 g/dL (ref 30.0–36.0)
MCV: 92.2 fl (ref 78.0–100.0)
Monocytes Absolute: 0.7 10*3/uL (ref 0.1–1.0)
Monocytes Relative: 6.2 % (ref 3.0–12.0)
Neutro Abs: 7.7 10*3/uL (ref 1.4–7.7)
Neutrophils Relative %: 64.7 % (ref 43.0–77.0)
Platelets: 266 10*3/uL (ref 150.0–400.0)
RBC: 4.04 Mil/uL (ref 3.87–5.11)
RDW: 14.2 % (ref 11.5–15.5)
WBC: 11.9 10*3/uL — ABNORMAL HIGH (ref 4.0–10.5)

## 2019-10-15 LAB — LIPID PANEL
Cholesterol: 213 mg/dL — ABNORMAL HIGH (ref 0–200)
HDL: 39 mg/dL — ABNORMAL LOW (ref >39.00)
NonHDL: 173.97
Total CHOL/HDL Ratio: 5
Triglycerides: 225 mg/dL — ABNORMAL HIGH (ref 0.0–149.0)
VLDL: 45 mg/dL — ABNORMAL HIGH (ref 0.0–40.0)

## 2019-10-15 LAB — HEPATIC FUNCTION PANEL
ALT: 16 U/L (ref 0–35)
AST: 14 U/L (ref 0–37)
Albumin: 4.4 g/dL (ref 3.5–5.2)
Alkaline Phosphatase: 61 U/L (ref 39–117)
Bilirubin, Direct: 0.1 mg/dL (ref 0.0–0.3)
Total Bilirubin: 0.5 mg/dL (ref 0.2–1.2)
Total Protein: 7 g/dL (ref 6.0–8.3)

## 2019-10-15 LAB — HEMOGLOBIN A1C: Hgb A1c MFr Bld: 6 % (ref 4.6–6.5)

## 2019-10-15 LAB — LDL CHOLESTEROL, DIRECT: Direct LDL: 149 mg/dL

## 2019-10-15 NOTE — Telephone Encounter (Signed)
I did not call her. She was just here this morning for labs.

## 2019-10-15 NOTE — Telephone Encounter (Signed)
Patient was returning call for results 

## 2019-10-17 ENCOUNTER — Ambulatory Visit (INDEPENDENT_AMBULATORY_CARE_PROVIDER_SITE_OTHER): Payer: PPO | Admitting: Internal Medicine

## 2019-10-17 ENCOUNTER — Encounter: Payer: Self-pay | Admitting: Internal Medicine

## 2019-10-17 ENCOUNTER — Other Ambulatory Visit: Payer: Self-pay

## 2019-10-17 DIAGNOSIS — M25551 Pain in right hip: Secondary | ICD-10-CM

## 2019-10-17 DIAGNOSIS — E7800 Pure hypercholesterolemia, unspecified: Secondary | ICD-10-CM | POA: Diagnosis not present

## 2019-10-17 DIAGNOSIS — N1831 Chronic kidney disease, stage 3a: Secondary | ICD-10-CM

## 2019-10-17 DIAGNOSIS — R739 Hyperglycemia, unspecified: Secondary | ICD-10-CM

## 2019-10-17 DIAGNOSIS — D72829 Elevated white blood cell count, unspecified: Secondary | ICD-10-CM

## 2019-10-17 DIAGNOSIS — E042 Nontoxic multinodular goiter: Secondary | ICD-10-CM | POA: Diagnosis not present

## 2019-10-17 DIAGNOSIS — I10 Essential (primary) hypertension: Secondary | ICD-10-CM | POA: Diagnosis not present

## 2019-10-17 NOTE — Progress Notes (Signed)
Patient ID: Cathy Vazquez, female   DOB: March 06, 1940, 79 y.o.   MRN: [id number]   Subjective:    Patient ID: Cathy Vazquez, female    DOB: 11-04-1940, 79 y.o.   MRN: [id number]  HPI This visit occurred during the SARS-CoV-2 public health emergency.  Safety protocols were in place, including screening questions prior to the visit, additional usage of staff PPE, and extensive cleaning of exam room while observing appropriate contact time as indicated for disinfecting solutions.  Patient here for a scheduled follow up.  She reports she is doing relatively well except for her right hip/low back pain.  S/p injection hip.  Had physical therapy.  No change. Still with pain - right groin.  Unable to stand a long period of time.  Has f/u planned 11/12/19.  No chest pain or sob reported.  No abdominal pain or bowel change reported.  Discussed labs.  Give [facility] LIpid diet.  Blood pressure per her report - ok.    Past Medical History:  Diagnosis Date  . Anemia   . Diverticulosis   . GERD (gastroesophageal reflux disease)   . Glaucoma   . Hypercholesterolemia   . Hypertension   . Thyroid nodule    Past Surgical History:  Procedure Laterality Date  . ABDOMINAL HYSTERECTOMY  1987   fibroids, ovaries not removed  . TONSILLECTOMY     Family History  Problem Relation Age of Onset  . Diabetes Father   . Hypertension Father   . Hypercholesterolemia Father   . Heart disease Father        CABG  . Hypertension Mother   . Hypercholesterolemia Mother   . Rheumatic fever Mother   . Hypertension Brother   . Diabetes Sister   . Breast cancer Maternal Aunt   . Breast cancer Cousin   . Colon cancer Neg Hx    Social History   Socioeconomic History  . Marital status: Divorced    Spouse name: Not on file  . Number of children: 2  . Years of education: Not on file  . Highest education level: Not on file  Occupational History  . Not on file  Tobacco Use  . Smoking status: Never Smoker  .  Smokeless tobacco: Never Used  Vaping Use  . Vaping Use: Never used  Substance and Sexual Activity  . Alcohol use: No    Alcohol/week: 0.0 standard drinks  . Drug use: No  . Sexual activity: Not Currently  Other Topics Concern  . Not on file  Social History Narrative  . Not on file   Social Determinants of Health   Financial Resource Strain:   . Difficulty of Paying Living Expenses: Not on file  Food Insecurity:   . Worried About Charity fundraiser in the Last Year: Not on file  . Ran Out of Food in the Last Year: Not on file  Transportation Needs:   . Lack of Transportation (Medical): Not on file  . Lack of Transportation (Non-Medical): Not on file  Physical Activity:   . Days of Exercise per Week: Not on file  . Minutes of Exercise per Session: Not on file  Stress:   . Feeling of Stress : Not on file  Social Connections:   . Frequency of Communication with Friends and Family: Not on file  . Frequency of Social Gatherings with Friends and Family: Not on file  . Attends Religious Services: Not on file  . Active Member of Clubs or Organizations: Not  on file  . Attends Archivist Meetings: Not on file  . Marital Status: Not on file    Outpatient Encounter Medications as of 10/17/2019  Medication Sig  . acetaminophen (TYLENOL) 325 MG tablet Take 650 mg by mouth every 4 (four) hours as needed.  . Alpha-Lipoic Acid 200 MG CAPS Take 200 mg by mouth daily.  Marland Kitchen Bioflavonoid Products (BIOFLEX PO) Take 1 tablet by mouth daily.   . fish oil-omega-3 fatty acids 1000 MG capsule Take 1 g by mouth 3 (three) times daily.  . Multiple Vitamin (MULTIVITAMIN) tablet Take 1 tablet by mouth daily.  Marland Kitchen spironolactone (ALDACTONE) 25 MG tablet Take 1 tablet (25 mg total) by mouth daily.  . Turmeric 500 MG TABS Take 500 mg by mouth 3 (three) times daily.  . [DISCONTINUED] olmesartan (BENICAR) 40 MG tablet TAKE 1 TABLET BY MOUTH EVERY DAY   No facility-administered encounter medications  on file as of 10/17/2019.    Review of Systems  Constitutional: Negative for appetite change and unexpected weight change.  HENT: Negative for congestion and sinus pressure.   Respiratory: Negative for cough, chest tightness and shortness of breath.   Cardiovascular: Negative for chest pain, palpitations and leg swelling.  Gastrointestinal: Negative for abdominal pain, diarrhea, nausea and vomiting.  Genitourinary: Negative for difficulty urinating and dysuria.  Musculoskeletal: Negative for joint swelling and myalgias.  Skin: Negative for color change and rash.  Neurological: Negative for dizziness, light-headedness and headaches.  Psychiatric/Behavioral: Negative for agitation and dysphoric mood.       Objective:    Physical Exam Vitals reviewed.  Constitutional:      General: She is not in acute distress.    Appearance: Normal appearance.  HENT:     Head: Normocephalic and atraumatic.     Right Ear: External ear normal.     Left Ear: External ear normal.  Eyes:     General: No scleral icterus.       Right eye: No discharge.        Left eye: No discharge.     Conjunctiva/sclera: Conjunctivae normal.  Neck:     Thyroid: No thyromegaly.  Cardiovascular:     Rate and Rhythm: Normal rate and regular rhythm.  Pulmonary:     Effort: No respiratory distress.     Breath sounds: Normal breath sounds. No wheezing.  Abdominal:     General: Bowel sounds are normal.     Palpations: Abdomen is soft.     Tenderness: There is no abdominal tenderness.  Musculoskeletal:        General: No swelling or tenderness.     Cervical back: Neck supple. No tenderness.  Lymphadenopathy:     Cervical: No cervical adenopathy.  Skin:    Findings: No erythema or rash.  Neurological:     Mental Status: She is alert.  Psychiatric:        Mood and Affect: Mood normal.        Behavior: Behavior normal.     BP 136/60   Pulse 98   Temp 98.4 F (36.9 C) (Oral)   Resp 16   Ht _0  (1.6 m)    Wt 173 lb (78.5 kg)   LMP 02/21/1984   SpO2 97%   BMI 30.65 kg/m  Wt Readings from Last 3 Encounters:  10/17/19 173 lb (78.5 kg)  06/11/19 169 lb 6.4 oz (76.8 kg)  05/08/19 163 lb (73.9 kg)     Lab Results  Component Value Date  WBC 11.9 (H) 10/15/2019   HGB 12.3 10/15/2019   HCT 37.2 10/15/2019   PLT 266.0 10/15/2019   GLUCOSE 118 (H) 10/15/2019   CHOL 213 (H) 10/15/2019   TRIG 225.0 (H) 10/15/2019   HDL 39.00 (L) 10/15/2019   LDLDIRECT 149.0 10/15/2019   LDLCALC 165 (H) 01/25/2019   ALT 16 10/15/2019   AST 14 10/15/2019   NA 134 (L) 10/15/2019   K 4.8 10/15/2019   CL 103 10/15/2019   CREATININE 1.12 10/15/2019   BUN 28 (H) 10/15/2019   CO2 22 10/15/2019   TSH 2.48 06/07/2019   HGBA1C 6.0 10/15/2019    US BREAST LTD UNI LEFT INC AXILLA  Result Date: 05/10/2019 CLINICAL DATA:  Possible mass in the 12 o'clock position of the right breast posteriorly and possible mass in the upper outer left breast posteriorly on a recent screening mammogram. EXAM: DIGITAL DIAGNOSTIC BILATERAL MAMMOGRAM WITH TOMO ULTRASOUND BILATERAL BREAST COMPARISON:  Previous exam(s). ACR Breast Density Category c: The breast tissue is heterogeneously dense, which may obscure small masses. FINDINGS: 3D tomographic and 2D generated spot compression images of both breasts demonstrate an approximately 1 cm oval, partially circumscribed and partially obscured mass in the superior aspect of right breast in the oblique projection, not seen in the craniocaudal projection. There is also an approximately 1 cm oval, partially circumscribed and partially obscured mass in the posterior aspect of the upper-outer left breast in approximately the 2 to 3 o'clock position. Targeted ultrasound is performed, showing a 1.4 cm cyst containing a small amount of internal echoes and partial thin internal septation in the 11:30 o'clock position of the right breast, 3 cm from the nipple, corresponding to the mammographic mass. A 9 mm  simple cyst is demonstrated in the 2 o'clock position of the left breast, 8 cm from the nipple, corresponding to the mammographic mass. There is also a nearby 6 mm cyst containing a thin partial internal septation. IMPRESSION: Benign bilateral breast cysts.  No evidence of malignancy. RECOMMENDATION: Bilateral screening mammogram in 1 year. I have discussed the findings and recommendations with the patient. If applicable, a reminder letter will be sent to the patient regarding the next appointment. BI-RADS CATEGORY  2: Benign. Electronically Signed   By: Claudie Revering M.D.   On: 05/10/2019 14:37   US BREAST LTD UNI RIGHT INC AXILLA  Result Date: 05/10/2019 CLINICAL DATA:  Possible mass in the 12 o'clock position of the right breast posteriorly and possible mass in the upper outer left breast posteriorly on a recent screening mammogram. EXAM: DIGITAL DIAGNOSTIC BILATERAL MAMMOGRAM WITH TOMO ULTRASOUND BILATERAL BREAST COMPARISON:  Previous exam(s). ACR Breast Density Category c: The breast tissue is heterogeneously dense, which may obscure small masses. FINDINGS: 3D tomographic and 2D generated spot compression images of both breasts demonstrate an approximately 1 cm oval, partially circumscribed and partially obscured mass in the superior aspect of right breast in the oblique projection, not seen in the craniocaudal projection. There is also an approximately 1 cm oval, partially circumscribed and partially obscured mass in the posterior aspect of the upper-outer left breast in approximately the 2 to 3 o'clock position. Targeted ultrasound is performed, showing a 1.4 cm cyst containing a small amount of internal echoes and partial thin internal septation in the 11:30 o'clock position of the right breast, 3 cm from the nipple, corresponding to the mammographic mass. A 9 mm simple cyst is demonstrated in the 2 o'clock position of the left breast, 8 cm from the nipple,  corresponding to the mammographic mass. There is  also a nearby 6 mm cyst containing a thin partial internal septation. IMPRESSION: Benign bilateral breast cysts.  No evidence of malignancy. RECOMMENDATION: Bilateral screening mammogram in 1 year. I have discussed the findings and recommendations with the patient. If applicable, a reminder letter will be sent to the patient regarding the next appointment. BI-RADS CATEGORY  2: Benign. Electronically Signed   By: Claudie Revering M.D.   On: 05/10/2019 14:37   MM DIAG BREAST TOMO BILATERAL  Result Date: 05/10/2019 CLINICAL DATA:  Possible mass in the 12 o'clock position of the right breast posteriorly and possible mass in the upper outer left breast posteriorly on a recent screening mammogram. EXAM: DIGITAL DIAGNOSTIC BILATERAL MAMMOGRAM WITH TOMO ULTRASOUND BILATERAL BREAST COMPARISON:  Previous exam(s). ACR Breast Density Category c: The breast tissue is heterogeneously dense, which may obscure small masses. FINDINGS: 3D tomographic and 2D generated spot compression images of both breasts demonstrate an approximately 1 cm oval, partially circumscribed and partially obscured mass in the superior aspect of right breast in the oblique projection, not seen in the craniocaudal projection. There is also an approximately 1 cm oval, partially circumscribed and partially obscured mass in the posterior aspect of the upper-outer left breast in approximately the 2 to 3 o'clock position. Targeted ultrasound is performed, showing a 1.4 cm cyst containing a small amount of internal echoes and partial thin internal septation in the 11:30 o'clock position of the right breast, 3 cm from the nipple, corresponding to the mammographic mass. A 9 mm simple cyst is demonstrated in the 2 o'clock position of the left breast, 8 cm from the nipple, corresponding to the mammographic mass. There is also a nearby 6 mm cyst containing a thin partial internal septation. IMPRESSION: Benign bilateral breast cysts.  No evidence of malignancy.  RECOMMENDATION: Bilateral screening mammogram in 1 year. I have discussed the findings and recommendations with the patient. If applicable, a reminder letter will be sent to the patient regarding the next appointment. BI-RADS CATEGORY  2: Benign. Electronically Signed   By: Claudie Revering M.D.   On: 05/10/2019 14:37       Assessment & Plan:   Problem List Items Addressed This Visit    Right hip pain    Right hip/groin pain.  Seeing ortho.  S/p injection and physical therapy.  Has f/u planned 11/12/19.        Multiple thyroid nodules    Has seen endocrinology previously.  Follow TSH.       Leukocytosis    Recheck cbc to confirm wbc's wnl.       Relevant Orders   CBC with Differential/Platelet   Hypertension    Blood pressure on recheck improved.  States outside checks are doing well.  Continue losartan and spironolactone.  Follow pressures.  Follow metabolic panel.       Hyperglycemia    Low carb diet and exercise.  Follow met b and a1c.       Hypercholesterolemia    Declines statin medication.  Low cholesterol diet and exercise.  Follow lipid panel and liver function tests.  Give [facility] Lipid diet.       CKD (chronic kidney disease)    Followed by nephrology.  Continue losartan.  Avoid antiinflammatories.  Follow metabolic panel.           Einar Pheasant, MD

## 2019-10-19 ENCOUNTER — Other Ambulatory Visit: Payer: Self-pay | Admitting: Internal Medicine

## 2019-10-24 DIAGNOSIS — M1611 Unilateral primary osteoarthritis, right hip: Secondary | ICD-10-CM | POA: Diagnosis not present

## 2019-10-27 ENCOUNTER — Encounter: Payer: Self-pay | Admitting: Internal Medicine

## 2019-10-27 DIAGNOSIS — D72829 Elevated white blood cell count, unspecified: Secondary | ICD-10-CM | POA: Insufficient documentation

## 2019-10-27 NOTE — Assessment & Plan Note (Signed)
Blood pressure on recheck improved.  States outside checks are doing well.  Continue losartan and spironolactone.  Follow pressures.  Follow metabolic panel.

## 2019-10-27 NOTE — Assessment & Plan Note (Signed)
Recheck cbc to confirm wbc's wnl.

## 2019-10-27 NOTE — Assessment & Plan Note (Signed)
Has seen endocrinology previously.  Follow TSH.

## 2019-10-27 NOTE — Assessment & Plan Note (Addendum)
Declines statin medication.  Low cholesterol diet and exercise.  Follow lipid panel and liver function tests.  Give [facility] Lipid diet.

## 2019-10-27 NOTE — Assessment & Plan Note (Signed)
Right hip/groin pain.  Seeing ortho.  S/p injection and physical therapy.  Has f/u planned 11/12/19.

## 2019-10-27 NOTE — Assessment & Plan Note (Signed)
Followed by nephrology.  Continue losartan.  Avoid antiinflammatories.  Follow metabolic panel.

## 2019-10-27 NOTE — Assessment & Plan Note (Signed)
Low carb diet and exercise.  Follow met b and a1c.  

## 2019-11-12 DIAGNOSIS — M1611 Unilateral primary osteoarthritis, right hip: Secondary | ICD-10-CM | POA: Diagnosis not present

## 2019-11-12 DIAGNOSIS — M25551 Pain in right hip: Secondary | ICD-10-CM | POA: Diagnosis not present

## 2019-11-20 ENCOUNTER — Other Ambulatory Visit (INDEPENDENT_AMBULATORY_CARE_PROVIDER_SITE_OTHER): Payer: PPO

## 2019-11-20 ENCOUNTER — Other Ambulatory Visit: Payer: Self-pay

## 2019-11-20 DIAGNOSIS — D72829 Elevated white blood cell count, unspecified: Secondary | ICD-10-CM | POA: Diagnosis not present

## 2019-11-20 LAB — CBC WITH DIFFERENTIAL/PLATELET
Basophils Absolute: 0.1 10*3/uL (ref 0.0–0.1)
Basophils Relative: 0.9 % (ref 0.0–3.0)
Eosinophils Absolute: 0.2 10*3/uL (ref 0.0–0.7)
Eosinophils Relative: 1.5 % (ref 0.0–5.0)
HCT: 36.9 % (ref 36.0–46.0)
Hemoglobin: 12.1 g/dL (ref 12.0–15.0)
Lymphocytes Relative: 24.4 % (ref 12.0–46.0)
Lymphs Abs: 2.8 10*3/uL (ref 0.7–4.0)
MCHC: 32.9 g/dL (ref 30.0–36.0)
MCV: 91.3 fl (ref 78.0–100.0)
Monocytes Absolute: 0.8 10*3/uL (ref 0.1–1.0)
Monocytes Relative: 6.7 % (ref 3.0–12.0)
Neutro Abs: 7.7 10*3/uL (ref 1.4–7.7)
Neutrophils Relative %: 66.5 % (ref 43.0–77.0)
Platelets: 284 10*3/uL (ref 150.0–400.0)
RBC: 4.04 Mil/uL (ref 3.87–5.11)
RDW: 14.5 % (ref 11.5–15.5)
WBC: 11.6 10*3/uL — ABNORMAL HIGH (ref 4.0–10.5)

## 2019-11-29 ENCOUNTER — Telehealth: Payer: Self-pay | Admitting: Internal Medicine

## 2019-11-29 NOTE — Telephone Encounter (Signed)
Patient is waiting to have hip surgery, not set up. She is requesting a referral for home health to come into home to help patient.

## 2019-12-02 NOTE — Telephone Encounter (Signed)
Patient stated to disregard her last message. She is looking for some one to come in about 1 day a week and help with her laundry and stuff. Advised that is not home health. She is going to look for someone privately to hire. Advised to let us know if she needs anything.

## 2019-12-02 NOTE — Telephone Encounter (Signed)
Pt has changed her mind and does not want referral placed

## 2019-12-24 DIAGNOSIS — M1611 Unilateral primary osteoarthritis, right hip: Secondary | ICD-10-CM | POA: Diagnosis not present

## 2019-12-24 DIAGNOSIS — E785 Hyperlipidemia, unspecified: Secondary | ICD-10-CM | POA: Diagnosis not present

## 2019-12-24 DIAGNOSIS — Z885 Allergy status to narcotic agent status: Secondary | ICD-10-CM | POA: Diagnosis not present

## 2019-12-24 DIAGNOSIS — Z7982 Long term (current) use of aspirin: Secondary | ICD-10-CM | POA: Diagnosis not present

## 2019-12-24 DIAGNOSIS — M160 Bilateral primary osteoarthritis of hip: Secondary | ICD-10-CM | POA: Diagnosis not present

## 2019-12-24 DIAGNOSIS — Z888 Allergy status to other drugs, medicaments and biological substances status: Secondary | ICD-10-CM | POA: Diagnosis not present

## 2019-12-24 DIAGNOSIS — R1031 Right lower quadrant pain: Secondary | ICD-10-CM | POA: Diagnosis not present

## 2019-12-24 DIAGNOSIS — I10 Essential (primary) hypertension: Secondary | ICD-10-CM | POA: Diagnosis not present

## 2019-12-24 DIAGNOSIS — M5416 Radiculopathy, lumbar region: Secondary | ICD-10-CM | POA: Diagnosis not present

## 2019-12-24 DIAGNOSIS — Z79899 Other long term (current) drug therapy: Secondary | ICD-10-CM | POA: Diagnosis not present

## 2019-12-25 NOTE — Telephone Encounter (Signed)
Are you okay with this?

## 2019-12-25 NOTE — Telephone Encounter (Signed)
This is not something that home health typically does. Can you help with this? Patient does not really need help with ADLs. She can bathe, dress, feed herself, etc. She needs help around her house when her family is not able to. I have talked with her and told her I was going to discuss with her to determine what her best options would be.

## 2019-12-25 NOTE — Telephone Encounter (Signed)
I am ok with this.  Just let me know what I need to do.

## 2019-12-25 NOTE — Telephone Encounter (Signed)
Pt called she is wanting to discuss home health referral again  Gave pt info below and she is still wanting someone to come in and help her cook, clean and do laundry because she is unable to [place] it by herself

## 2019-12-25 NOTE — Telephone Encounter (Signed)
If PCP is ok with I can place CCM referral to [facility].

## 2019-12-26 NOTE — Telephone Encounter (Signed)
No assistance needed from PCP [facility] community referral placed.

## 2020-01-15 ENCOUNTER — Telehealth: Payer: Self-pay | Admitting: Internal Medicine

## 2020-01-15 NOTE — Telephone Encounter (Signed)
Patient has been notified

## 2020-01-15 NOTE — Telephone Encounter (Signed)
Pt called and wanted something stronger called in the acetaminophen (TYLENOL) 325 MG tablet is not helping with pain

## 2020-01-15 NOTE — Telephone Encounter (Signed)
I am ok with them giving her a stronger pain medication,(if she needs it)  just not an antiinflammatory - due to [place] following kidney function and blood pressure.

## 2020-01-15 NOTE — Telephone Encounter (Signed)
Patient stated orthopedic was going to give her medication but she said no since Dr Nicki Reaper told her to only take tylenol for her hip px. She would like to have Dr Nicki Reaper send in medication. If Dr Nicki Reaper is ok with orthopedic giving her a stronger medication, she will call them to get it from them. Please advbise.

## 2020-01-26 ENCOUNTER — Other Ambulatory Visit: Payer: Self-pay | Admitting: Internal Medicine

## 2020-02-03 IMAGING — CR RIGHT SHOULDER - 2+ VIEW
1 series · 3 of 3 positions shown · non-contrast
Comparison: None.

CLINICAL DATA: Right shoulder pain after fall yesterday.

EXAM:
RIGHT SHOULDER - 2+ VIEW

[Series 1: dg shoulder right · 0.14mm/px · 3 of 3 slices shown]
[im 1/3]
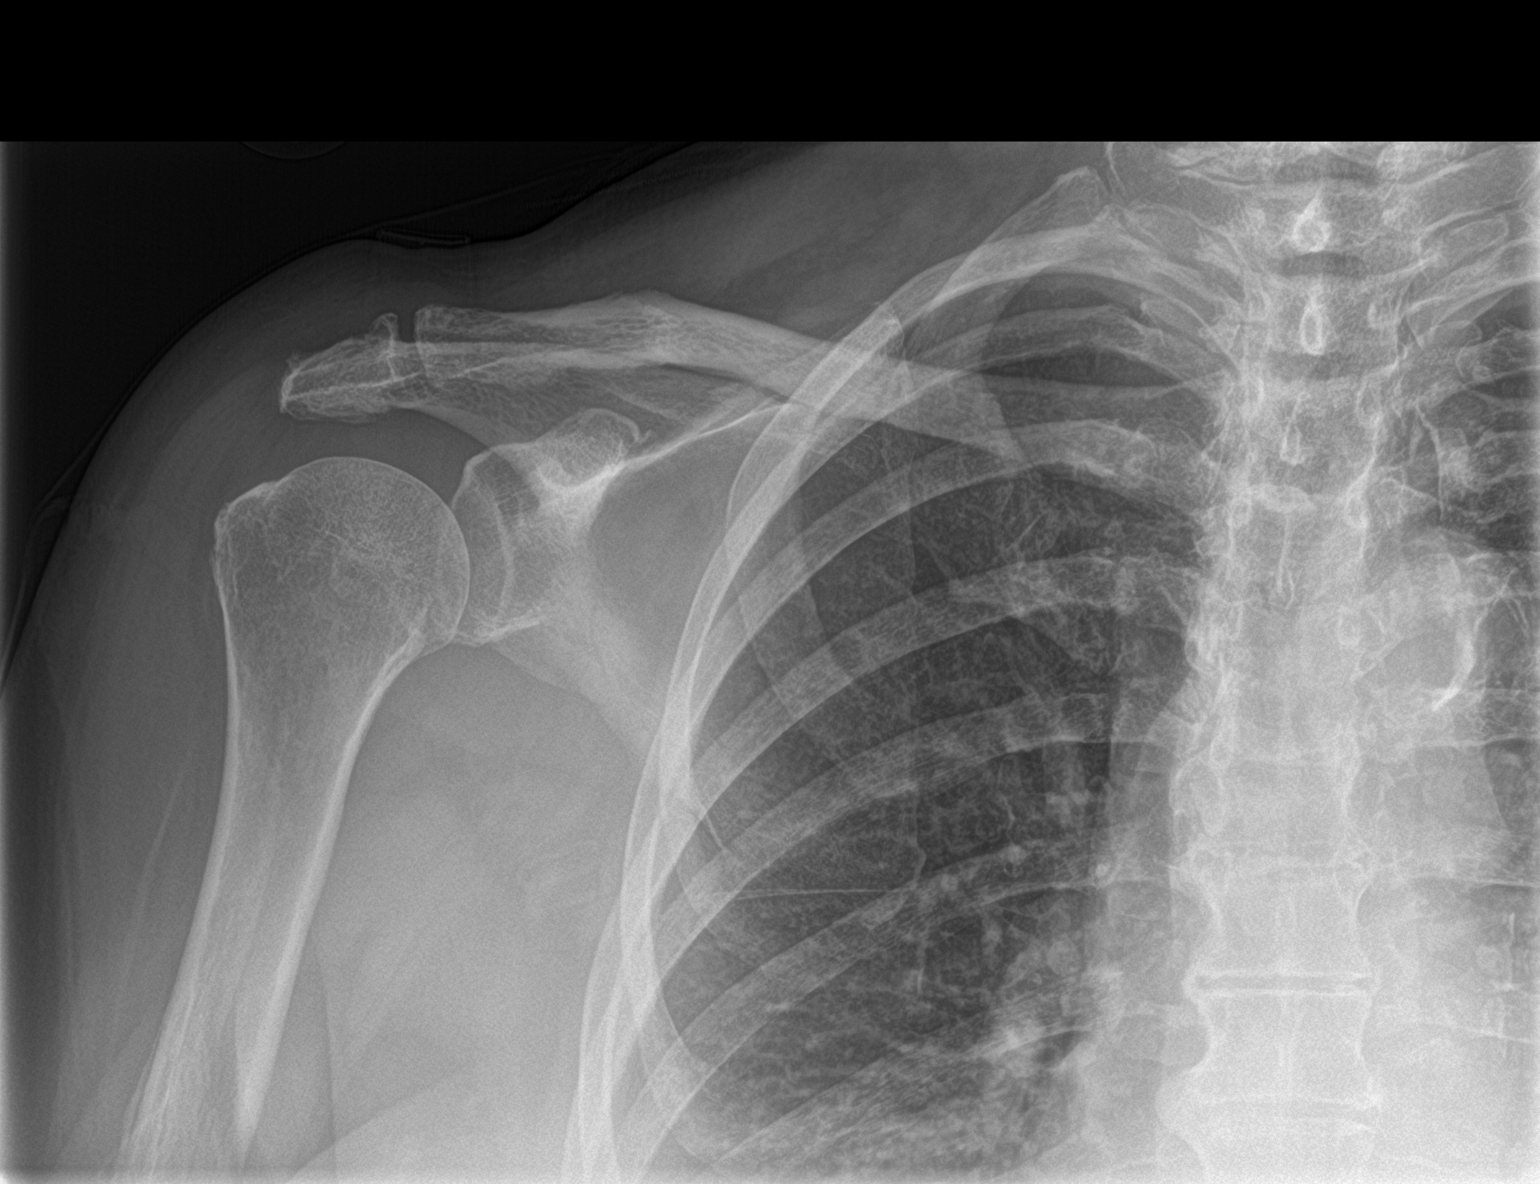
[im 2/3]
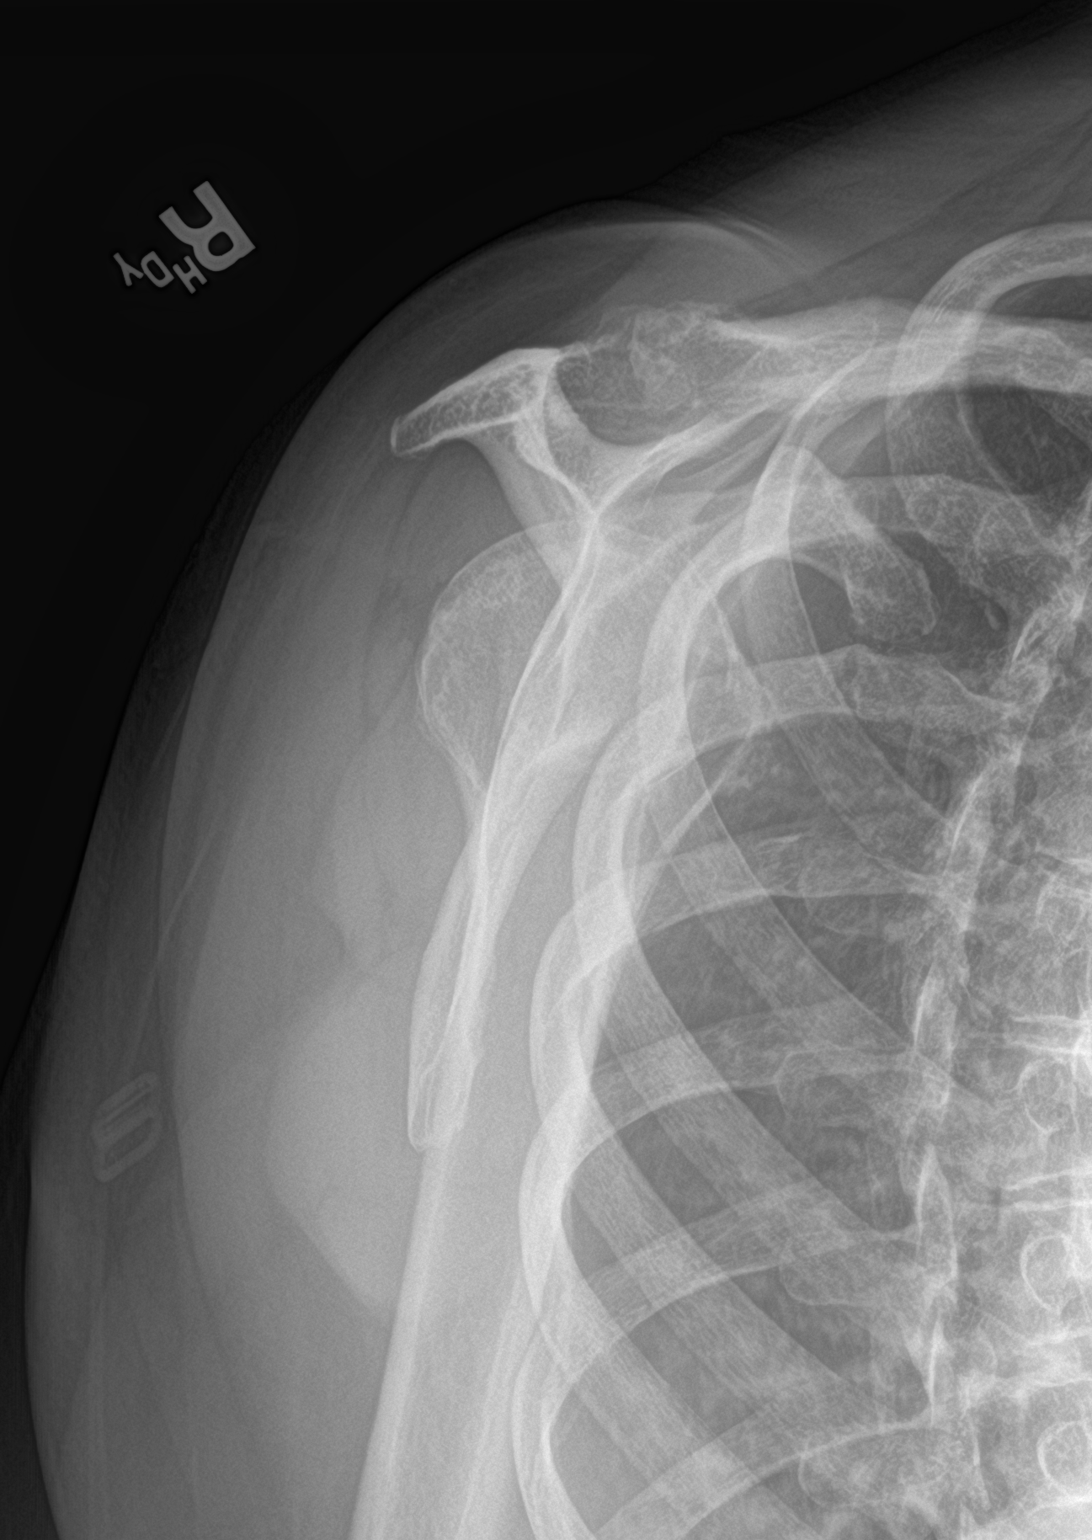
[im 3/3]
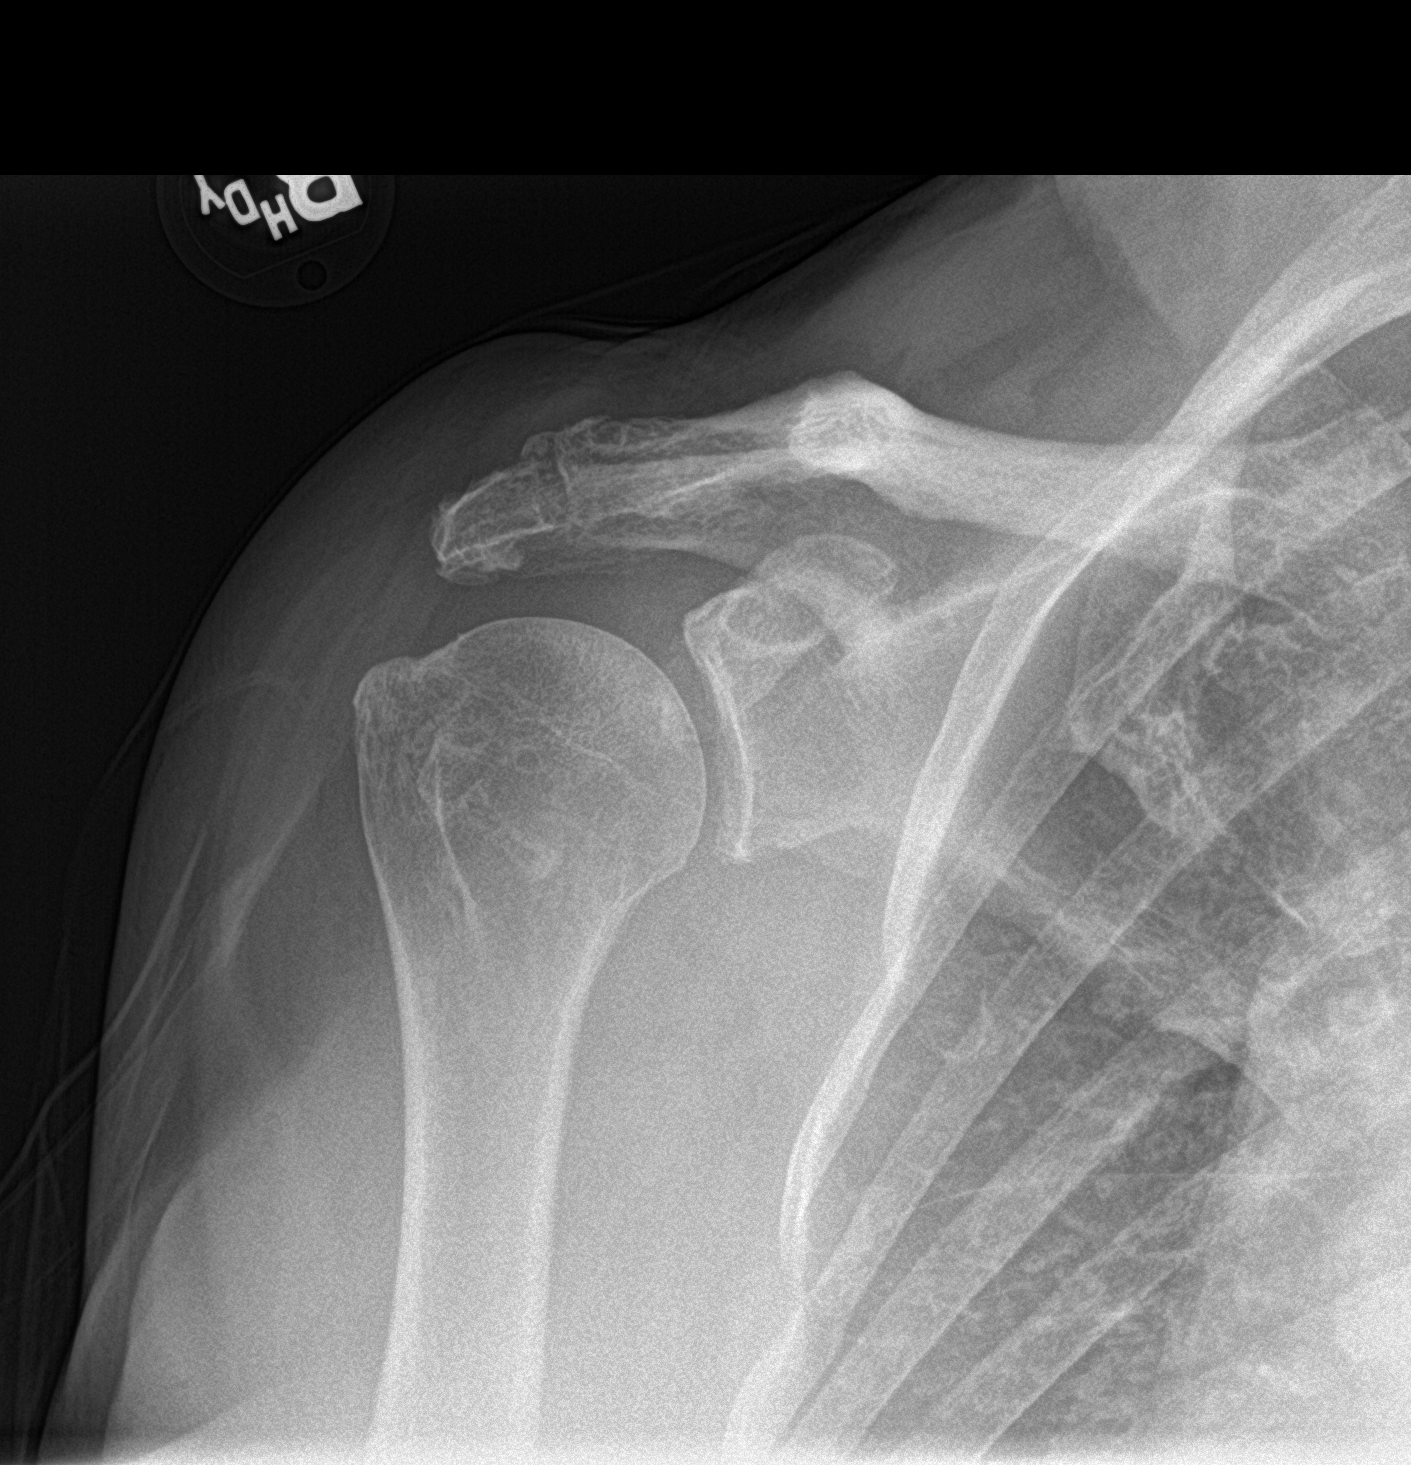

[3 of 3 positions shown; findings below may reference images not displayed]

FINDINGS: There is no evidence of fracture or dislocation. Mild degenerative
changes seen involving the right acromioclavicular joint. Soft
tissues are unremarkable.
IMPRESSION: Mild degenerative joint disease of right acromioclavicular joint. No
acute abnormality seen in the right shoulder

## 2020-02-10 ENCOUNTER — Other Ambulatory Visit: Payer: Self-pay

## 2020-02-10 ENCOUNTER — Other Ambulatory Visit: Payer: Self-pay | Admitting: Internal Medicine

## 2020-02-10 ENCOUNTER — Other Ambulatory Visit (INDEPENDENT_AMBULATORY_CARE_PROVIDER_SITE_OTHER): Payer: PPO

## 2020-02-10 DIAGNOSIS — I10 Essential (primary) hypertension: Secondary | ICD-10-CM

## 2020-02-10 DIAGNOSIS — E7800 Pure hypercholesterolemia, unspecified: Secondary | ICD-10-CM | POA: Diagnosis not present

## 2020-02-10 DIAGNOSIS — R739 Hyperglycemia, unspecified: Secondary | ICD-10-CM

## 2020-02-10 LAB — HEPATIC FUNCTION PANEL
ALT: 14 U/L (ref 0–35)
AST: 12 U/L (ref 0–37)
Albumin: 4.5 g/dL (ref 3.5–5.2)
Alkaline Phosphatase: 78 U/L (ref 39–117)
Bilirubin, Direct: 0.1 mg/dL (ref 0.0–0.3)
Total Bilirubin: 0.5 mg/dL (ref 0.2–1.2)
Total Protein: 7.2 g/dL (ref 6.0–8.3)

## 2020-02-10 LAB — CBC WITH DIFFERENTIAL/PLATELET
Basophils Absolute: 0.1 10*3/uL (ref 0.0–0.1)
Basophils Relative: 0.7 % (ref 0.0–3.0)
Eosinophils Absolute: 0.2 10*3/uL (ref 0.0–0.7)
Eosinophils Relative: 1.8 % (ref 0.0–5.0)
HCT: 38.7 % (ref 36.0–46.0)
Hemoglobin: 12.8 g/dL (ref 12.0–15.0)
Lymphocytes Relative: 25.4 % (ref 12.0–46.0)
Lymphs Abs: 3 10*3/uL (ref 0.7–4.0)
MCHC: 33 g/dL (ref 30.0–36.0)
MCV: 90.1 fl (ref 78.0–100.0)
Monocytes Absolute: 0.8 10*3/uL (ref 0.1–1.0)
Monocytes Relative: 7.1 % (ref 3.0–12.0)
Neutro Abs: 7.7 10*3/uL (ref 1.4–7.7)
Neutrophils Relative %: 65 % (ref 43.0–77.0)
Platelets: 283 10*3/uL (ref 150.0–400.0)
RBC: 4.29 Mil/uL (ref 3.87–5.11)
RDW: 14.8 % (ref 11.5–15.5)
WBC: 11.8 10*3/uL — ABNORMAL HIGH (ref 4.0–10.5)

## 2020-02-10 LAB — BASIC METABOLIC PANEL
BUN: 27 mg/dL — ABNORMAL HIGH (ref 6–23)
CO2: 27 mEq/L (ref 19–32)
Calcium: 9.7 mg/dL (ref 8.4–10.5)
Chloride: 103 mEq/L (ref 96–112)
Creatinine, Ser: 1.06 mg/dL (ref 0.40–1.20)
GFR: 49.27 mL/min — ABNORMAL LOW (ref >60.00)
Glucose, Bld: 111 mg/dL — ABNORMAL HIGH (ref 70–99)
Potassium: 5.3 mEq/L — ABNORMAL HIGH (ref 3.5–5.1)
Sodium: 136 mEq/L (ref 135–145)

## 2020-02-10 LAB — LIPID PANEL
Cholesterol: 222 mg/dL — ABNORMAL HIGH (ref 0–200)
HDL: 38.8 mg/dL — ABNORMAL LOW (ref >39.00)
NonHDL: 182.74
Total CHOL/HDL Ratio: 6
Triglycerides: 253 mg/dL — ABNORMAL HIGH (ref 0.0–149.0)
VLDL: 50.6 mg/dL — ABNORMAL HIGH (ref 0.0–40.0)

## 2020-02-10 LAB — LDL CHOLESTEROL, DIRECT: Direct LDL: 150 mg/dL

## 2020-02-10 NOTE — Progress Notes (Signed)
Orders placed for labs

## 2020-02-11 LAB — HEMOGLOBIN A1C: Hgb A1c MFr Bld: 6.1 % (ref 4.6–6.5)

## 2020-02-12 ENCOUNTER — Ambulatory Visit (INDEPENDENT_AMBULATORY_CARE_PROVIDER_SITE_OTHER): Payer: PPO | Admitting: Internal Medicine

## 2020-02-12 ENCOUNTER — Other Ambulatory Visit: Payer: Self-pay

## 2020-02-12 ENCOUNTER — Encounter: Payer: Self-pay | Admitting: Internal Medicine

## 2020-02-12 VITALS — BP 142/70 | HR 96 | Temp 98.1°F | Resp 16 | Ht 63.0 in | Wt 171.0 lb

## 2020-02-12 DIAGNOSIS — R739 Hyperglycemia, unspecified: Secondary | ICD-10-CM

## 2020-02-12 DIAGNOSIS — N1831 Chronic kidney disease, stage 3a: Secondary | ICD-10-CM

## 2020-02-12 DIAGNOSIS — I10 Essential (primary) hypertension: Secondary | ICD-10-CM

## 2020-02-12 DIAGNOSIS — E875 Hyperkalemia: Secondary | ICD-10-CM | POA: Diagnosis not present

## 2020-02-12 DIAGNOSIS — M25551 Pain in right hip: Secondary | ICD-10-CM

## 2020-02-12 DIAGNOSIS — E7800 Pure hypercholesterolemia, unspecified: Secondary | ICD-10-CM

## 2020-02-12 LAB — POTASSIUM: Potassium: 5 mEq/L (ref 3.5–5.1)

## 2020-02-12 NOTE — Progress Notes (Signed)
Patient ID: Cathy Vazquez, female   DOB: [birth year], 79 y.o.   MRN: [id number]   Subjective:    Patient ID: Cathy Vazquez, female    DOB: [birth year], 79 y.o.   MRN: [id number]  HPI This visit occurred during the SARS-CoV-2 public health emergency.  Safety protocols were in place, including screening questions prior to the visit, additional usage of staff PPE, and extensive cleaning of exam room while observing appropriate contact time as indicated for disinfecting solutions.  Patient here for a scheduled follow up.  Here to follow up regarding her blood pressure and to have her potassium rechecked,  She is accompanied by her friend.  Has been having increased pain in her right hip.  Limiting her activity.  Using a walker to ambulate.  Seeing ortho.  S/p injection.  Planning for surgery.  States other than her hip, she is doing relatively well.  No chest pain or sob.  No acid reflux.  No abdominal pain or bowel change.    Past Medical History:  Diagnosis Date  . Anemia   . Diverticulosis   . GERD (gastroesophageal reflux disease)   . Glaucoma   . Hypercholesterolemia   . Hypertension   . Thyroid nodule    Past Surgical History:  Procedure Laterality Date  . ABDOMINAL HYSTERECTOMY  1987   fibroids, ovaries not removed  . TONSILLECTOMY     Family History  Problem Relation Age of Onset  . Diabetes Father   . Hypertension Father   . Hypercholesterolemia Father   . Heart disease Father        CABG  . Hypertension Mother   . Hypercholesterolemia Mother   . Rheumatic fever Mother   . Hypertension Brother   . Diabetes Sister   . Breast cancer Maternal Aunt   . Breast cancer Cousin   . Colon cancer Neg Hx    Social History   Socioeconomic History  . Marital status: Divorced    Spouse name: Not on file  . Number of children: 2  . Years of education: Not on file  . Highest education level: Not on file  Occupational History  . Not on file  Tobacco Use  . Smoking  status: Never Smoker  . Smokeless tobacco: Never Used  Vaping Use  . Vaping Use: Never used  Substance and Sexual Activity  . Alcohol use: No    Alcohol/week: 0.0 standard drinks  . Drug use: No  . Sexual activity: Not Currently  Other Topics Concern  . Not on file  Social History Narrative  . Not on file   Social Determinants of Health   Financial Resource Strain: Not on file  Food Insecurity: Not on file  Transportation Needs: Not on file  Physical Activity: Not on file  Stress: Not on file  Social Connections: Not on file    Outpatient Encounter Medications as of 02/12/2020  Medication Sig  . acetaminophen (TYLENOL) 325 MG tablet Take 650 mg by mouth every 4 (four) hours as needed.  . Alpha-Lipoic Acid 200 MG CAPS Take 200 mg by mouth daily.  Marland Kitchen Bioflavonoid Products (BIOFLEX PO) Take 1 tablet by mouth daily.   . fish oil-omega-3 fatty acids 1000 MG capsule Take 1 g by mouth 3 (three) times daily.  Marland Kitchen losartan (COZAAR) 100 MG tablet TAKE 1 TABLET BY MOUTH EVERY DAY  . Multiple Vitamin (MULTIVITAMIN) tablet Take 1 tablet by mouth daily.  Marland Kitchen spironolactone (ALDACTONE) 25 MG tablet TAKE 1 TABLET BY MOUTH  EVERY DAY  . Turmeric 500 MG TABS Take 500 mg by mouth 3 (three) times daily.   No facility-administered encounter medications on file as of 02/12/2020.    Review of Systems  Constitutional: Negative for appetite change and unexpected weight change.  HENT: Negative for congestion.   Respiratory: Negative for cough, chest tightness and shortness of breath.   Cardiovascular: Negative for chest pain, palpitations and leg swelling.  Gastrointestinal: Negative for abdominal pain, diarrhea, nausea and vomiting.  Genitourinary: Negative for difficulty urinating and dysuria.  Musculoskeletal: Negative for joint swelling and myalgias.       Increased right hip pain as outlined - pain right groin and to knee.   Skin: Negative for color change and rash.  Neurological: Negative for  dizziness, light-headedness and headaches.  Psychiatric/Behavioral: Negative for agitation and dysphoric mood.       Objective:    Physical Exam Vitals reviewed.  Constitutional:      General: She is not in acute distress.    Appearance: Normal appearance.  HENT:     Head: Normocephalic and atraumatic.     Right Ear: External ear normal.     Left Ear: External ear normal.     Mouth/Throat:     Mouth: Oropharynx is clear and moist.  Eyes:     General: No scleral icterus.       Right eye: No discharge.        Left eye: No discharge.     Conjunctiva/sclera: Conjunctivae normal.  Neck:     Thyroid: No thyromegaly.  Cardiovascular:     Rate and Rhythm: Normal rate and regular rhythm.  Pulmonary:     Effort: No respiratory distress.     Breath sounds: Normal breath sounds. No wheezing.  Abdominal:     General: Bowel sounds are normal.     Palpations: Abdomen is soft.     Tenderness: There is no abdominal tenderness.  Musculoskeletal:        General: No swelling, tenderness or edema.     Cervical back: Neck supple. No tenderness.  Lymphadenopathy:     Cervical: No cervical adenopathy.  Skin:    Findings: No erythema or rash.  Neurological:     Mental Status: She is alert.  Psychiatric:        Mood and Affect: Mood normal.        Behavior: Behavior normal.     BP (!) 142/70   Pulse 96   Temp 98.1 F (36.7 C) (Oral)   Resp 16   Ht $R'5\' 3"'kr$  (1.6 m)   Wt 171 lb (77.6 kg)   LMP 02/21/1984   SpO2 99%   BMI 30.29 kg/m  Wt Readings from Last 3 Encounters:  02/12/20 171 lb (77.6 kg)  10/17/19 173 lb (78.5 kg)  06/11/19 169 lb 6.4 oz (76.8 kg)     Lab Results  Component Value Date   WBC 11.8 (H) 02/10/2020   HGB 12.8 02/10/2020   HCT 38.7 02/10/2020   PLT 283.0 02/10/2020   GLUCOSE 111 (H) 02/10/2020   CHOL 222 (H) 02/10/2020   TRIG 253.0 (H) 02/10/2020   HDL 38.80 (L) 02/10/2020   LDLDIRECT 150.0 02/10/2020   LDLCALC 165 (H) 01/25/2019   ALT 14 02/10/2020    AST 12 02/10/2020   NA 136 02/10/2020   K 5.0 02/12/2020   CL 103 02/10/2020   CREATININE 1.06 02/10/2020   BUN 27 (H) 02/10/2020   CO2 27 02/10/2020   TSH 2.48  06/07/2019   HGBA1C 6.1 02/10/2020    US BREAST LTD UNI LEFT INC AXILLA  Result Date: 05/10/2019 CLINICAL DATA:  Possible mass in the 12 o'clock position of the right breast posteriorly and possible mass in the upper outer left breast posteriorly on a recent screening mammogram. EXAM: DIGITAL DIAGNOSTIC BILATERAL MAMMOGRAM WITH TOMO ULTRASOUND BILATERAL BREAST COMPARISON:  Previous exam(s). ACR Breast Density Category c: The breast tissue is heterogeneously dense, which may obscure small masses. FINDINGS: 3D tomographic and 2D generated spot compression images of both breasts demonstrate an approximately 1 cm oval, partially circumscribed and partially obscured mass in the superior aspect of right breast in the oblique projection, not seen in the craniocaudal projection. There is also an approximately 1 cm oval, partially circumscribed and partially obscured mass in the posterior aspect of the upper-outer left breast in approximately the 2 to 3 o'clock position. Targeted ultrasound is performed, showing a 1.4 cm cyst containing a small amount of internal echoes and partial thin internal septation in the 11:30 o'clock position of the right breast, 3 cm from the nipple, corresponding to the mammographic mass. A 9 mm simple cyst is demonstrated in the 2 o'clock position of the left breast, 8 cm from the nipple, corresponding to the mammographic mass. There is also a nearby 6 mm cyst containing a thin partial internal septation. IMPRESSION: Benign bilateral breast cysts.  No evidence of malignancy. RECOMMENDATION: Bilateral screening mammogram in 1 year. I have discussed the findings and recommendations with the patient. If applicable, a reminder letter will be sent to the patient regarding the next appointment. BI-RADS CATEGORY  2: Benign.  Electronically Signed   By: Beckie Salts M.D.   On: 05/10/2019 14:37   US BREAST LTD UNI RIGHT INC AXILLA  Result Date: 05/10/2019 CLINICAL DATA:  Possible mass in the 12 o'clock position of the right breast posteriorly and possible mass in the upper outer left breast posteriorly on a recent screening mammogram. EXAM: DIGITAL DIAGNOSTIC BILATERAL MAMMOGRAM WITH TOMO ULTRASOUND BILATERAL BREAST COMPARISON:  Previous exam(s). ACR Breast Density Category c: The breast tissue is heterogeneously dense, which may obscure small masses. FINDINGS: 3D tomographic and 2D generated spot compression images of both breasts demonstrate an approximately 1 cm oval, partially circumscribed and partially obscured mass in the superior aspect of right breast in the oblique projection, not seen in the craniocaudal projection. There is also an approximately 1 cm oval, partially circumscribed and partially obscured mass in the posterior aspect of the upper-outer left breast in approximately the 2 to 3 o'clock position. Targeted ultrasound is performed, showing a 1.4 cm cyst containing a small amount of internal echoes and partial thin internal septation in the 11:30 o'clock position of the right breast, 3 cm from the nipple, corresponding to the mammographic mass. A 9 mm simple cyst is demonstrated in the 2 o'clock position of the left breast, 8 cm from the nipple, corresponding to the mammographic mass. There is also a nearby 6 mm cyst containing a thin partial internal septation. IMPRESSION: Benign bilateral breast cysts.  No evidence of malignancy. RECOMMENDATION: Bilateral screening mammogram in 1 year. I have discussed the findings and recommendations with the patient. If applicable, a reminder letter will be sent to the patient regarding the next appointment. BI-RADS CATEGORY  2: Benign. Electronically Signed   By: Beckie Salts M.D.   On: 05/10/2019 14:37   MM DIAG BREAST TOMO BILATERAL  Result Date: 05/10/2019 CLINICAL DATA:   Possible mass in the 12  o'clock position of the right breast posteriorly and possible mass in the upper outer left breast posteriorly on a recent screening mammogram. EXAM: DIGITAL DIAGNOSTIC BILATERAL MAMMOGRAM WITH TOMO ULTRASOUND BILATERAL BREAST COMPARISON:  Previous exam(s). ACR Breast Density Category c: The breast tissue is heterogeneously dense, which may obscure small masses. FINDINGS: 3D tomographic and 2D generated spot compression images of both breasts demonstrate an approximately 1 cm oval, partially circumscribed and partially obscured mass in the superior aspect of right breast in the oblique projection, not seen in the craniocaudal projection. There is also an approximately 1 cm oval, partially circumscribed and partially obscured mass in the posterior aspect of the upper-outer left breast in approximately the 2 to 3 o'clock position. Targeted ultrasound is performed, showing a 1.4 cm cyst containing a small amount of internal echoes and partial thin internal septation in the 11:30 o'clock position of the right breast, 3 cm from the nipple, corresponding to the mammographic mass. A 9 mm simple cyst is demonstrated in the 2 o'clock position of the left breast, 8 cm from the nipple, corresponding to the mammographic mass. There is also a nearby 6 mm cyst containing a thin partial internal septation. IMPRESSION: Benign bilateral breast cysts.  No evidence of malignancy. RECOMMENDATION: Bilateral screening mammogram in 1 year. I have discussed the findings and recommendations with the patient. If applicable, a reminder letter will be sent to the patient regarding the next appointment. BI-RADS CATEGORY  2: Benign. Electronically Signed   By: Claudie Revering M.D.   On: 05/10/2019 14:37       Assessment & Plan:   Problem List Items Addressed This Visit    Hypertension    Continue losartan and amlodipine.  Blood pressure as outlined.  Pain may be contributing to slight elevation.  Has been under  better control.  Follow pressures.  Follow metabolic panel.       Hypercholesterolemia    Low cholesterol diet and exercise.  Declines statin medication.  Discussed recommendation to start.  Follow lipid panel.        Hyperglycemia    Low carb diet and exercise.  Follow met b and a1c.       CKD (chronic kidney disease)    Followed by nephrology.  Continue to avoid antiinflammatories.  Remains on losartan.  Follow metabolic panel.       Right hip pain    Seeing ortho.  S/p injection and PT. Persistent pain.  Limiting activity.  Planning for surgery.  Will schedule f/u prior to surgery.         Other Visit Diagnoses    Hyperkalemia    -  Primary   Relevant Orders   Potassium (Completed)       Einar Pheasant, MD

## 2020-02-18 ENCOUNTER — Other Ambulatory Visit: Payer: PPO

## 2020-02-19 ENCOUNTER — Encounter: Payer: Self-pay | Admitting: Internal Medicine

## 2020-02-19 NOTE — Assessment & Plan Note (Signed)
Low carb diet and exercise.  Follow met b and a1c.  

## 2020-02-19 NOTE — Assessment & Plan Note (Signed)
Seeing ortho.  S/p injection and PT. Persistent pain.  Limiting activity.  Planning for surgery.  Will schedule f/u prior to surgery.

## 2020-02-19 NOTE — Assessment & Plan Note (Signed)
Continue losartan and amlodipine.  Blood pressure as outlined.  Pain may be contributing to slight elevation.  Has been under better control.  Follow pressures.  Follow metabolic panel.

## 2020-02-19 NOTE — Assessment & Plan Note (Signed)
Low cholesterol diet and exercise.  Declines statin medication.  Discussed recommendation to start.  Follow lipid panel.

## 2020-02-19 NOTE — Assessment & Plan Note (Signed)
Followed by nephrology.  Continue to avoid antiinflammatories.  Remains on losartan.  Follow metabolic panel.

## 2020-02-20 ENCOUNTER — Ambulatory Visit: Payer: PPO | Admitting: Internal Medicine

## 2020-02-28 ENCOUNTER — Telehealth: Payer: Self-pay

## 2020-02-28 NOTE — Telephone Encounter (Signed)
Pt is having hip surgery and wants to have the paperwork filled out prior to her appt on 03/26/20. She states that she is seeing someone to do the EKG somewhere else before the surgery. She does not know why she even needs appt on 03/26/20. Please advise

## 2020-02-28 NOTE — Telephone Encounter (Signed)
I had talked with her at her visit.  We were not sure how close to her surgery that she would need to ekg.  If we are going to complete the form, can do ekg here and send to MD.  We will have to review EKG to clear.  See me if questions or problems.

## 2020-02-28 NOTE — Telephone Encounter (Signed)
FYI- we have the form for her clearance

## 2020-03-04 NOTE — Telephone Encounter (Signed)
Spoke with Dr Clarita Crane nurse. They are doing a pre-procedure appt and labs. Pt is going to keep the appt with [place] for her to complete clearance form.

## 2020-03-04 NOTE — Telephone Encounter (Signed)
LM for Dr Clarita Crane nurse to return my call. Patient is aware that I am working on this. She has pre-op with them 03/12/20

## 2020-03-12 DIAGNOSIS — R7309 Other abnormal glucose: Secondary | ICD-10-CM | POA: Diagnosis not present

## 2020-03-12 DIAGNOSIS — Z7901 Long term (current) use of anticoagulants: Secondary | ICD-10-CM | POA: Diagnosis not present

## 2020-03-12 DIAGNOSIS — Z5181 Encounter for therapeutic drug level monitoring: Secondary | ICD-10-CM | POA: Diagnosis not present

## 2020-03-12 DIAGNOSIS — I444 Left anterior fascicular block: Secondary | ICD-10-CM | POA: Diagnosis not present

## 2020-03-12 DIAGNOSIS — I499 Cardiac arrhythmia, unspecified: Secondary | ICD-10-CM | POA: Diagnosis not present

## 2020-03-12 DIAGNOSIS — Z01818 Encounter for other preprocedural examination: Secondary | ICD-10-CM | POA: Diagnosis not present

## 2020-03-12 DIAGNOSIS — M1611 Unilateral primary osteoarthritis, right hip: Secondary | ICD-10-CM | POA: Diagnosis not present

## 2020-03-26 ENCOUNTER — Other Ambulatory Visit: Payer: Self-pay

## 2020-03-26 ENCOUNTER — Ambulatory Visit (INDEPENDENT_AMBULATORY_CARE_PROVIDER_SITE_OTHER): Payer: PPO | Admitting: Internal Medicine

## 2020-03-26 VITALS — BP 128/64 | HR 90 | Temp 98.8°F | Resp 16 | Ht 63.0 in | Wt 172.0 lb

## 2020-03-26 DIAGNOSIS — D72829 Elevated white blood cell count, unspecified: Secondary | ICD-10-CM

## 2020-03-26 DIAGNOSIS — Z01818 Encounter for other preprocedural examination: Secondary | ICD-10-CM | POA: Diagnosis not present

## 2020-03-26 DIAGNOSIS — I10 Essential (primary) hypertension: Secondary | ICD-10-CM

## 2020-03-26 DIAGNOSIS — N1831 Chronic kidney disease, stage 3a: Secondary | ICD-10-CM | POA: Diagnosis not present

## 2020-03-26 DIAGNOSIS — Z01811 Encounter for preprocedural respiratory examination: Secondary | ICD-10-CM

## 2020-03-26 DIAGNOSIS — Z7689 Persons encountering health services in other specified circumstances: Secondary | ICD-10-CM | POA: Diagnosis not present

## 2020-03-26 DIAGNOSIS — E7800 Pure hypercholesterolemia, unspecified: Secondary | ICD-10-CM | POA: Diagnosis not present

## 2020-03-26 DIAGNOSIS — R739 Hyperglycemia, unspecified: Secondary | ICD-10-CM

## 2020-03-26 DIAGNOSIS — M25551 Pain in right hip: Secondary | ICD-10-CM | POA: Diagnosis not present

## 2020-03-26 DIAGNOSIS — R0602 Shortness of breath: Secondary | ICD-10-CM | POA: Diagnosis not present

## 2020-03-26 DIAGNOSIS — R9431 Abnormal electrocardiogram [ECG] [EKG]: Secondary | ICD-10-CM | POA: Diagnosis not present

## 2020-03-26 LAB — CBC WITH DIFFERENTIAL/PLATELET
Basophils Absolute: 0.1 10*3/uL (ref 0.0–0.1)
Basophils Relative: 1.2 % (ref 0.0–3.0)
Eosinophils Absolute: 0.1 10*3/uL (ref 0.0–0.7)
Eosinophils Relative: 1.2 % (ref 0.0–5.0)
HCT: 35.9 % — ABNORMAL LOW (ref 36.0–46.0)
Hemoglobin: 12.2 g/dL (ref 12.0–15.0)
Lymphocytes Relative: 23.1 % (ref 12.0–46.0)
Lymphs Abs: 2.6 10*3/uL (ref 0.7–4.0)
MCHC: 34.1 g/dL (ref 30.0–36.0)
MCV: 91.8 fl (ref 78.0–100.0)
Monocytes Absolute: 0.8 10*3/uL (ref 0.1–1.0)
Monocytes Relative: 7 % (ref 3.0–12.0)
Neutro Abs: 7.5 10*3/uL (ref 1.4–7.7)
Neutrophils Relative %: 67.5 % (ref 43.0–77.0)
Platelets: 291 10*3/uL (ref 150.0–400.0)
RBC: 3.92 Mil/uL (ref 3.87–5.11)
RDW: 15.4 % (ref 11.5–15.5)
WBC: 11.1 10*3/uL — ABNORMAL HIGH (ref 4.0–10.5)

## 2020-03-26 NOTE — Progress Notes (Signed)
Patient ID: Cathy Vazquez, female   DOB: 1940-03-04, 80 y.o.   MRN: [id number]   Subjective:    Patient ID: Cathy Vazquez, female    DOB: Aug 11, 1940, 80 y.o.   MRN: [id number]  HPI This visit occurred during the SARS-CoV-2 public health emergency.  Safety protocols were in place, including screening questions prior to the visit, additional usage of staff PPE, and extensive cleaning of exam room while observing appropriate contact time as indicated for disinfecting solutions.  Patient here for pre op evaluation.  She is accompanied by her friend.  Planning for right total hip arthroplasty. She has DJD right hip.  Has tried conservative management options and continues to have increased pain, limiting her mobility and quality of life. She denies any chest pain.  No increased sob.  Eating.  No nausea or vomiting.  bowels moving.  States blood pressures averaging 120-130s/60s.      Past Medical History:  Diagnosis Date  . Anemia   . Diverticulosis   . GERD (gastroesophageal reflux disease)   . Glaucoma   . Hypercholesterolemia   . Hypertension   . Thyroid nodule    Past Surgical History:  Procedure Laterality Date  . ABDOMINAL HYSTERECTOMY  1987   fibroids, ovaries not removed  . TONSILLECTOMY     Family History  Problem Relation Age of Onset  . Diabetes Father   . Hypertension Father   . Hypercholesterolemia Father   . Heart disease Father        CABG  . Hypertension Mother   . Hypercholesterolemia Mother   . Rheumatic fever Mother   . Hypertension Brother   . Diabetes Sister   . Breast cancer Maternal Aunt   . Breast cancer Cousin   . Colon cancer Neg Hx    Social History   Socioeconomic History  . Marital status: Divorced    Spouse name: Not on file  . Number of children: 2  . Years of education: Not on file  . Highest education level: Not on file  Occupational History  . Not on file  Tobacco Use  . Smoking status: Never Smoker  . Smokeless tobacco:  Never Used  Vaping Use  . Vaping Use: Never used  Substance and Sexual Activity  . Alcohol use: No    Alcohol/week: 0.0 standard drinks  . Drug use: No  . Sexual activity: Not Currently  Other Topics Concern  . Not on file  Social History Narrative  . Not on file   Social Determinants of Health   Financial Resource Strain: Not on file  Food Insecurity: Not on file  Transportation Needs: Not on file  Physical Activity: Not on file  Stress: Not on file  Social Connections: Not on file    Outpatient Encounter Medications as of 03/26/2020  Medication Sig  . QUERCETIN PO Take 500 mg by mouth daily.  Marland Kitchen acetaminophen (TYLENOL) 325 MG tablet Take 650 mg by mouth every 4 (four) hours as needed.  . Alpha-Lipoic Acid 200 MG CAPS Take 200 mg by mouth daily.  Marland Kitchen Bioflavonoid Products (BIOFLEX PO) Take 1 tablet by mouth daily.   . fish oil-omega-3 fatty acids 1000 MG capsule Take 1 g by mouth 3 (three) times daily.  Marland Kitchen losartan (COZAAR) 100 MG tablet TAKE 1 TABLET BY MOUTH EVERY DAY  . Multiple Vitamin (MULTIVITAMIN) tablet Take 1 tablet by mouth daily.  Marland Kitchen spironolactone (ALDACTONE) 25 MG tablet TAKE 1 TABLET BY MOUTH EVERY DAY  . Turmeric 500  MG TABS Take 500 mg by mouth 3 (three) times daily.   No facility-administered encounter medications on file as of 03/26/2020.    Review of Systems  Constitutional: Negative for appetite change and unexpected weight change.  HENT: Negative for congestion and sinus pressure.   Respiratory: Negative for cough and chest tightness.        No increased sob.  Not active. Limited by pain in her hip.  Limited walking.   Cardiovascular: Negative for chest pain, palpitations and leg swelling.  Gastrointestinal: Negative for abdominal pain, diarrhea, nausea and vomiting.  Genitourinary: Negative for difficulty urinating and dysuria.  Musculoskeletal: Negative for joint swelling and myalgias.       Increased hip pain.  Limited walking.   Skin: Negative for  color change and rash.  Neurological: Negative for dizziness, light-headedness and headaches.  Psychiatric/Behavioral: Negative for agitation and dysphoric mood.       Objective:    Physical Exam Vitals reviewed.  Constitutional:      General: She is not in acute distress.    Appearance: Normal appearance.  HENT:     Head: Normocephalic and atraumatic.     Right Ear: External ear normal.     Left Ear: External ear normal.     Mouth/Throat:     Mouth: Oropharynx is clear and moist.  Eyes:     General: No scleral icterus.       Right eye: No discharge.        Left eye: No discharge.     Conjunctiva/sclera: Conjunctivae normal.  Neck:     Thyroid: No thyromegaly.  Cardiovascular:     Rate and Rhythm: Normal rate and regular rhythm.  Pulmonary:     Effort: No respiratory distress.     Breath sounds: Normal breath sounds. No wheezing.  Abdominal:     General: Bowel sounds are normal.     Palpations: Abdomen is soft.     Tenderness: There is no abdominal tenderness.  Musculoskeletal:        General: No swelling or edema.     Cervical back: Neck supple. No tenderness.     Comments: Increased pain with weight bearing - pain - right hip.   Lymphadenopathy:     Cervical: No cervical adenopathy.  Skin:    Findings: No erythema or rash.  Neurological:     Mental Status: She is alert.  Psychiatric:        Mood and Affect: Mood normal.        Behavior: Behavior normal.     BP 128/64   Pulse 90   Temp 98.8 F (37.1 C) (Oral)   Resp 16   Ht 5' 3" (1.6 m)   Wt 172 lb (78 kg)   LMP 02/21/1984   SpO2 98%   BMI 30.47 kg/m  Wt Readings from Last 3 Encounters:  03/26/20 172 lb (78 kg)  02/12/20 171 lb (77.6 kg)  10/17/19 173 lb (78.5 kg)     Lab Results  Component Value Date   WBC 11.1 (H) 03/26/2020   HGB 12.2 03/26/2020   HCT 35.9 (L) 03/26/2020   PLT 291.0 03/26/2020   GLUCOSE 111 (H) 02/10/2020   CHOL 222 (H) 02/10/2020   TRIG 253.0 (H) 02/10/2020   HDL  38.80 (L) 02/10/2020   LDLDIRECT 150.0 02/10/2020   LDLCALC 165 (H) 01/25/2019   ALT 14 02/10/2020   AST 12 02/10/2020   NA 136 02/10/2020   K 5.0 02/12/2020   CL  103 02/10/2020   CREATININE 1.06 02/10/2020   BUN 27 (H) 02/10/2020   CO2 27 02/10/2020   TSH 2.48 06/07/2019   HGBA1C 6.1 02/10/2020    US BREAST LTD UNI LEFT INC AXILLA  Result Date: 05/10/2019 CLINICAL DATA:  Possible mass in the 12 o'clock position of the right breast posteriorly and possible mass in the upper outer left breast posteriorly on a recent screening mammogram. EXAM: DIGITAL DIAGNOSTIC BILATERAL MAMMOGRAM WITH TOMO ULTRASOUND BILATERAL BREAST COMPARISON:  Previous exam(s). ACR Breast Density Category c: The breast tissue is heterogeneously dense, which may obscure small masses. FINDINGS: 3D tomographic and 2D generated spot compression images of both breasts demonstrate an approximately 1 cm oval, partially circumscribed and partially obscured mass in the superior aspect of right breast in the oblique projection, not seen in the craniocaudal projection. There is also an approximately 1 cm oval, partially circumscribed and partially obscured mass in the posterior aspect of the upper-outer left breast in approximately the 2 to 3 o'clock position. Targeted ultrasound is performed, showing a 1.4 cm cyst containing a small amount of internal echoes and partial thin internal septation in the 11:30 o'clock position of the right breast, 3 cm from the nipple, corresponding to the mammographic mass. A 9 mm simple cyst is demonstrated in the 2 o'clock position of the left breast, 8 cm from the nipple, corresponding to the mammographic mass. There is also a nearby 6 mm cyst containing a thin partial internal septation. IMPRESSION: Benign bilateral breast cysts.  No evidence of malignancy. RECOMMENDATION: Bilateral screening mammogram in 1 year. I have discussed the findings and recommendations with the patient. If applicable, a reminder  letter will be sent to the patient regarding the next appointment. BI-RADS CATEGORY  2: Benign. Electronically Signed   By: Claudie Revering M.D.   On: 05/10/2019 14:37   US BREAST LTD UNI RIGHT INC AXILLA  Result Date: 05/10/2019 CLINICAL DATA:  Possible mass in the 12 o'clock position of the right breast posteriorly and possible mass in the upper outer left breast posteriorly on a recent screening mammogram. EXAM: DIGITAL DIAGNOSTIC BILATERAL MAMMOGRAM WITH TOMO ULTRASOUND BILATERAL BREAST COMPARISON:  Previous exam(s). ACR Breast Density Category c: The breast tissue is heterogeneously dense, which may obscure small masses. FINDINGS: 3D tomographic and 2D generated spot compression images of both breasts demonstrate an approximately 1 cm oval, partially circumscribed and partially obscured mass in the superior aspect of right breast in the oblique projection, not seen in the craniocaudal projection. There is also an approximately 1 cm oval, partially circumscribed and partially obscured mass in the posterior aspect of the upper-outer left breast in approximately the 2 to 3 o'clock position. Targeted ultrasound is performed, showing a 1.4 cm cyst containing a small amount of internal echoes and partial thin internal septation in the 11:30 o'clock position of the right breast, 3 cm from the nipple, corresponding to the mammographic mass. A 9 mm simple cyst is demonstrated in the 2 o'clock position of the left breast, 8 cm from the nipple, corresponding to the mammographic mass. There is also a nearby 6 mm cyst containing a thin partial internal septation. IMPRESSION: Benign bilateral breast cysts.  No evidence of malignancy. RECOMMENDATION: Bilateral screening mammogram in 1 year. I have discussed the findings and recommendations with the patient. If applicable, a reminder letter will be sent to the patient regarding the next appointment. BI-RADS CATEGORY  2: Benign. Electronically Signed   By: Percell Locus.D.  On: 05/10/2019 14:37   MM DIAG BREAST TOMO BILATERAL  Result Date: 05/10/2019 CLINICAL DATA:  Possible mass in the 12 o'clock position of the right breast posteriorly and possible mass in the upper outer left breast posteriorly on a recent screening mammogram. EXAM: DIGITAL DIAGNOSTIC BILATERAL MAMMOGRAM WITH TOMO ULTRASOUND BILATERAL BREAST COMPARISON:  Previous exam(s). ACR Breast Density Category c: The breast tissue is heterogeneously dense, which may obscure small masses. FINDINGS: 3D tomographic and 2D generated spot compression images of both breasts demonstrate an approximately 1 cm oval, partially circumscribed and partially obscured mass in the superior aspect of right breast in the oblique projection, not seen in the craniocaudal projection. There is also an approximately 1 cm oval, partially circumscribed and partially obscured mass in the posterior aspect of the upper-outer left breast in approximately the 2 to 3 o'clock position. Targeted ultrasound is performed, showing a 1.4 cm cyst containing a small amount of internal echoes and partial thin internal septation in the 11:30 o'clock position of the right breast, 3 cm from the nipple, corresponding to the mammographic mass. A 9 mm simple cyst is demonstrated in the 2 o'clock position of the left breast, 8 cm from the nipple, corresponding to the mammographic mass. There is also a nearby 6 mm cyst containing a thin partial internal septation. IMPRESSION: Benign bilateral breast cysts.  No evidence of malignancy. RECOMMENDATION: Bilateral screening mammogram in 1 year. I have discussed the findings and recommendations with the patient. If applicable, a reminder letter will be sent to the patient regarding the next appointment. BI-RADS CATEGORY  2: Benign. Electronically Signed   By: Claudie Revering M.D.   On: 05/10/2019 14:37       Assessment & Plan:   Problem List Items Addressed This Visit    CKD (chronic kidney disease)    GFR - stable  around 49.  Continue to avoid antiinflammatories.        Hyperglycemia    Low carb diet and exercise.  Follow met b and a1c.       Hypertension    Blood pressure recheck;  128/64.  Continue amlodipine and losartan.  Will need close intra op and post op monitoring of heart rate and blood pressure go avoid extremes.        Leukocytosis    Recheck cbc to confirm wbc count stable.  No signs or symptoms of infection.  Follow.       Relevant Orders   CBC with Differential/Platelet (Completed)   Pre-op evaluation    Planning for hip surgery as outlined.  Hip pain is limiting her activity.  Denies chest pain. EKG - SR/ST with TWI in aVL.  Non specific changes noted.  Given EKG and limited activity, do feel cardiac evaluation is warranted prior to her surgery.  Pt in agreement.  Will need close intra op and post op monitoring of heart rate and blood pressure to avoid extremes.       Relevant Orders   EKG 12-Lead (Completed)   Right hip pain    Persistent pain.  S/p injection and PT.  Limiting her walking and activity.  Quality life affected. Planning for surgery as outlined.  See pre op evaluation.       Other Visit Diagnoses    Pre-op chest exam    -  Primary       Einar Pheasant, MD

## 2020-03-28 ENCOUNTER — Encounter: Payer: Self-pay | Admitting: Internal Medicine

## 2020-03-28 DIAGNOSIS — Z01818 Encounter for other preprocedural examination: Secondary | ICD-10-CM | POA: Insufficient documentation

## 2020-03-28 NOTE — Assessment & Plan Note (Signed)
Persistent pain.  S/p injection and PT.  Limiting her walking and activity.  Quality life affected. Planning for surgery as outlined.  See pre op evaluation.

## 2020-03-28 NOTE — Assessment & Plan Note (Addendum)
Planning for hip surgery as outlined.  Hip pain is limiting her activity.  Denies chest pain. EKG - SR/ST with TWI in aVL.  Non specific changes noted.  Given EKG and limited activity, do feel cardiac evaluation is warranted prior to her surgery.  Pt in agreement.  Will need close intra op and post op monitoring of heart rate and blood pressure to avoid extremes.

## 2020-03-28 NOTE — Assessment & Plan Note (Signed)
Low carb diet and exercise.  Follow met b and a1c.  

## 2020-03-28 NOTE — Assessment & Plan Note (Signed)
Blood pressure recheck;  128/64.  Continue amlodipine and losartan.  Will need close intra op and post op monitoring of heart rate and blood pressure go avoid extremes.

## 2020-03-28 NOTE — Assessment & Plan Note (Signed)
GFR - stable around 49.  Continue to avoid antiinflammatories.

## 2020-03-28 NOTE — Assessment & Plan Note (Signed)
Recheck cbc to confirm wbc count stable.  No signs or symptoms of infection.  Follow.

## 2020-03-31 NOTE — Telephone Encounter (Signed)
Pt said they are canceling her hip surgery and she wants a call back from [facility].

## 2020-03-31 NOTE — Telephone Encounter (Signed)
Dr Jaclynn Guarneri has been in an accident and will not be able to do her surgery. Not sure when he will be back to work. She is going to let me know if she wishes to see another surgeon

## 2020-04-02 DIAGNOSIS — R0602 Shortness of breath: Secondary | ICD-10-CM | POA: Diagnosis not present

## 2020-04-08 ENCOUNTER — Telehealth: Payer: Self-pay

## 2020-04-08 NOTE — Telephone Encounter (Signed)
Pt wants to know if Dr Nicki Reaper cleared her for hip surgery. Please advise

## 2020-04-09 IMAGING — US US RENAL
1 series · 14 of 25 positions shown · non-contrast
Comparison: None.

CLINICAL DATA: Acute renal failure.

EXAM:
RENAL / URINARY TRACT ULTRASOUND COMPLETE

[Series 1: us renal · 0.22mm/px · 14 of 38 slices shown]
[im 1/38]
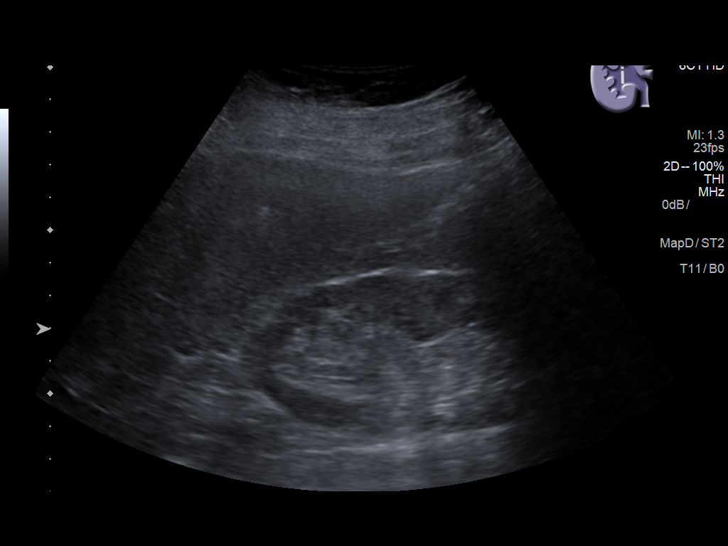
[im 4/38]
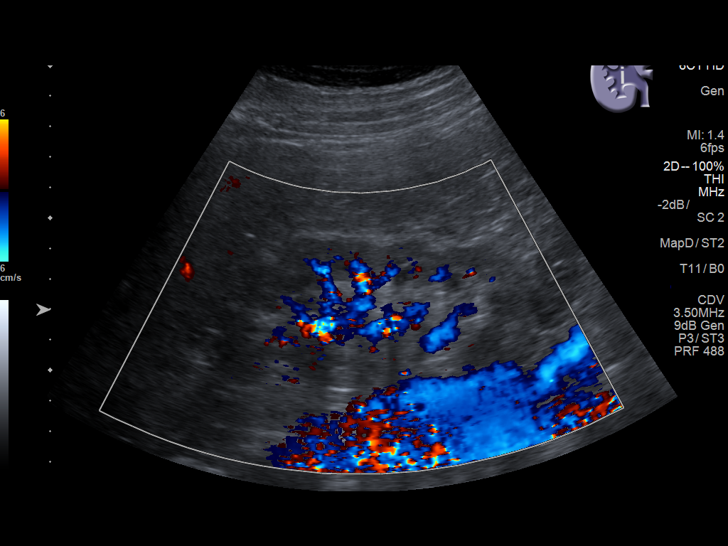
[im 7/38]
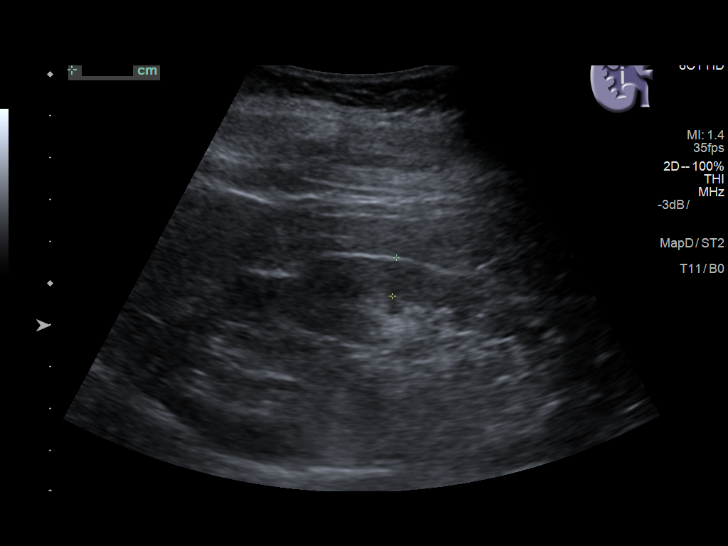
[im 10/38]
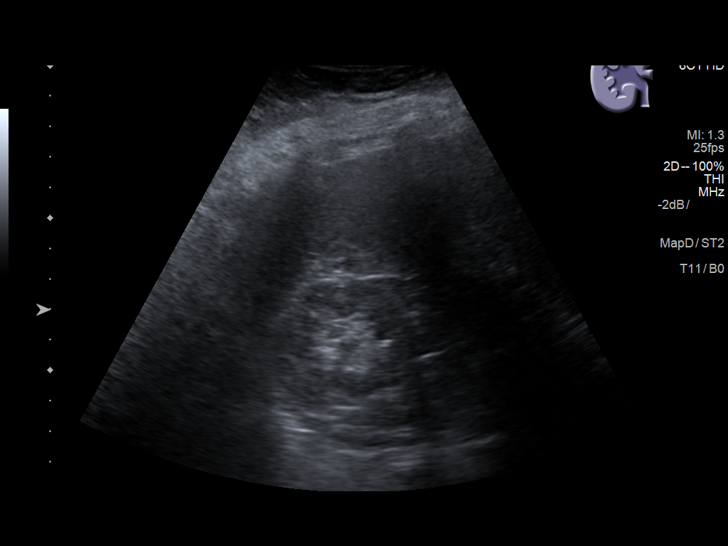
[im 13/38]
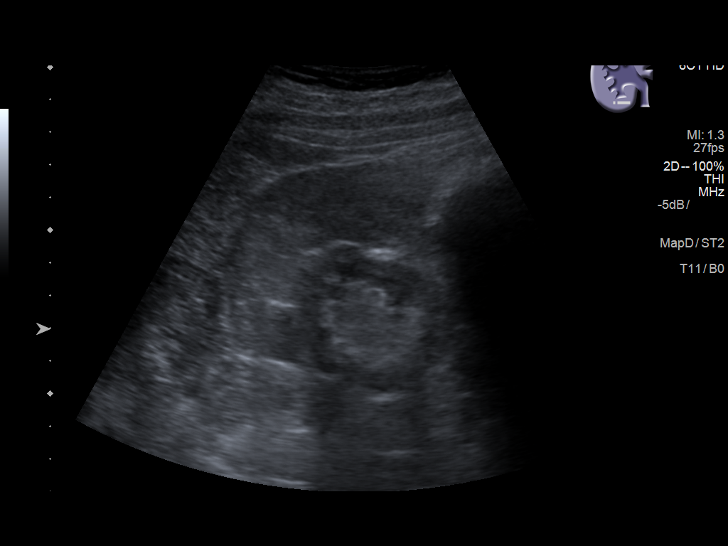
[im 14/38]
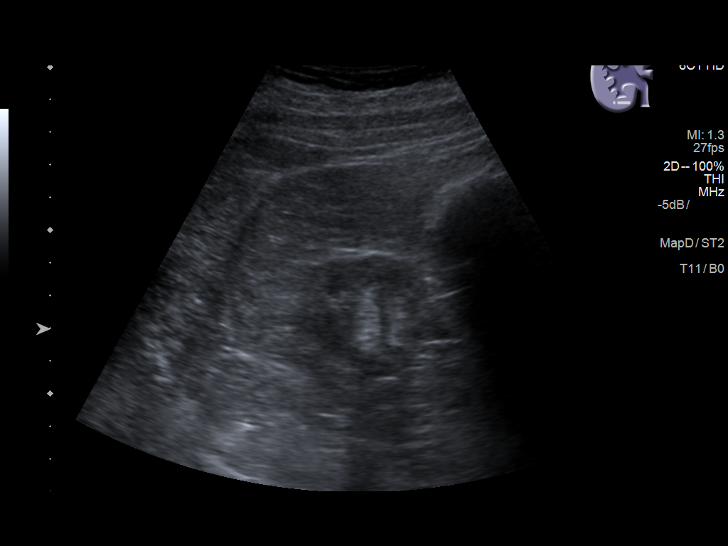
[im 17/38]
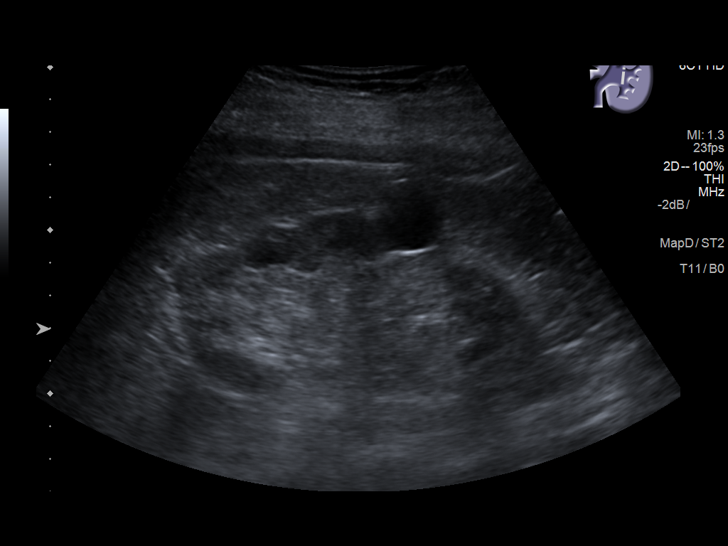
[im 21/38]
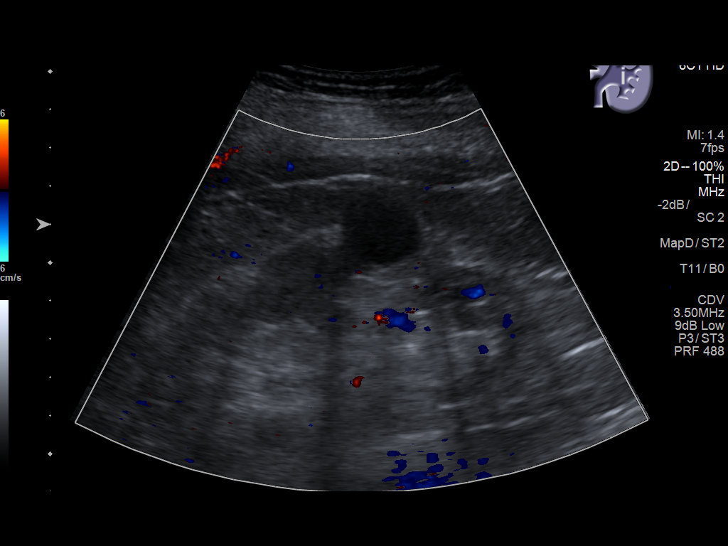
[im 24/38]
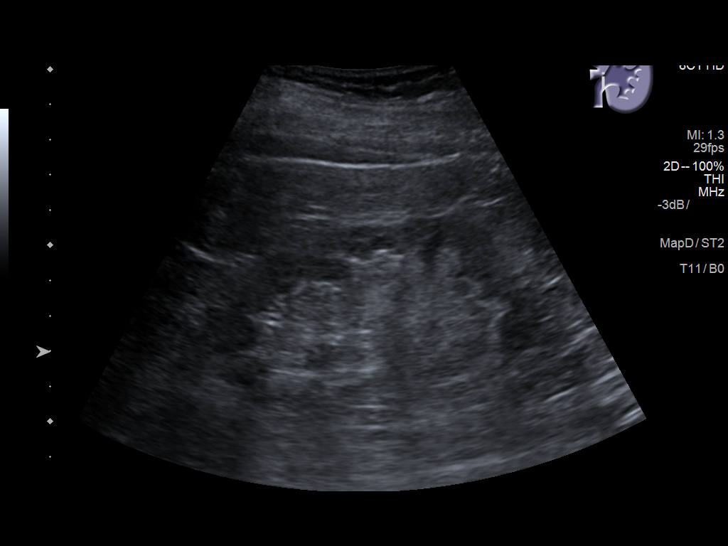
[im 25/38]
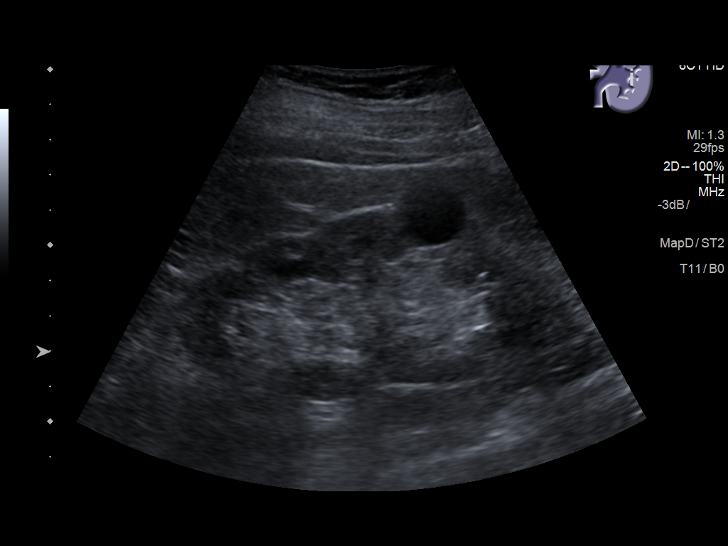
[im 28/38]
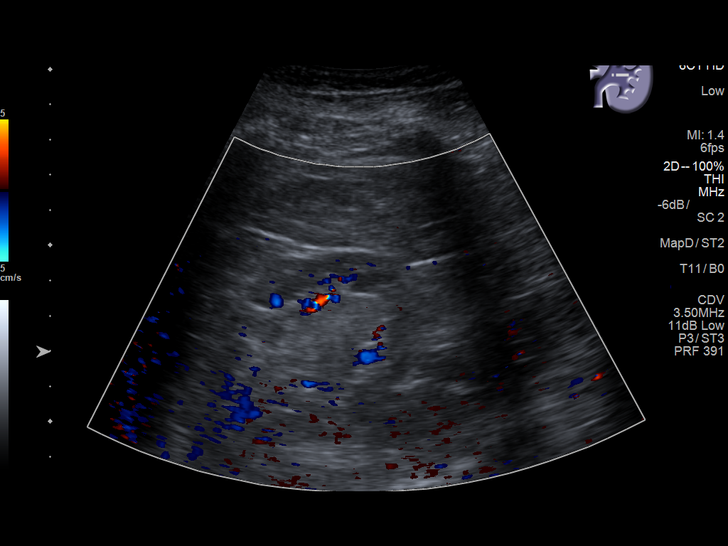
[im 31/38]
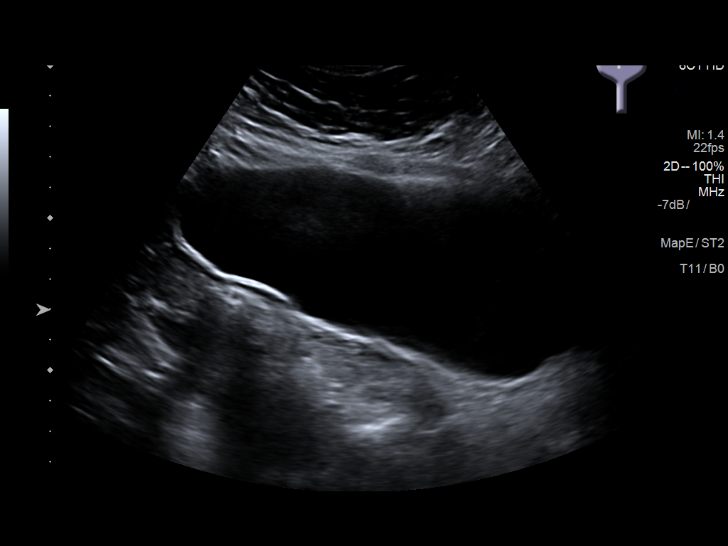
[im 34/38]
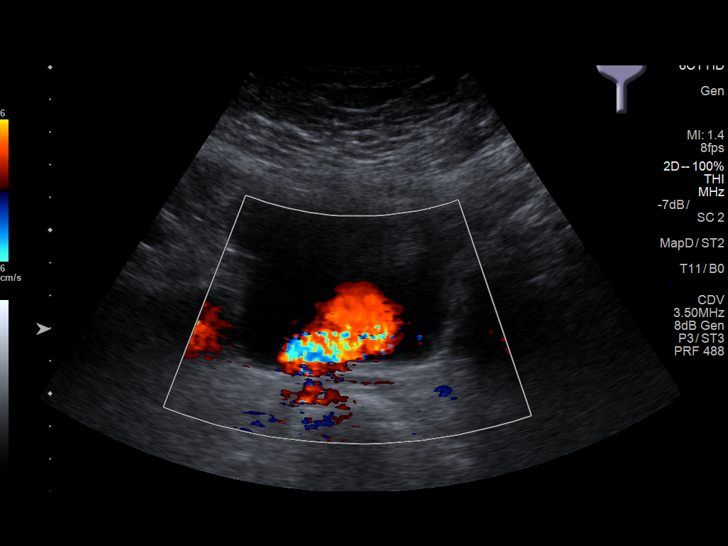
[im 38/38]
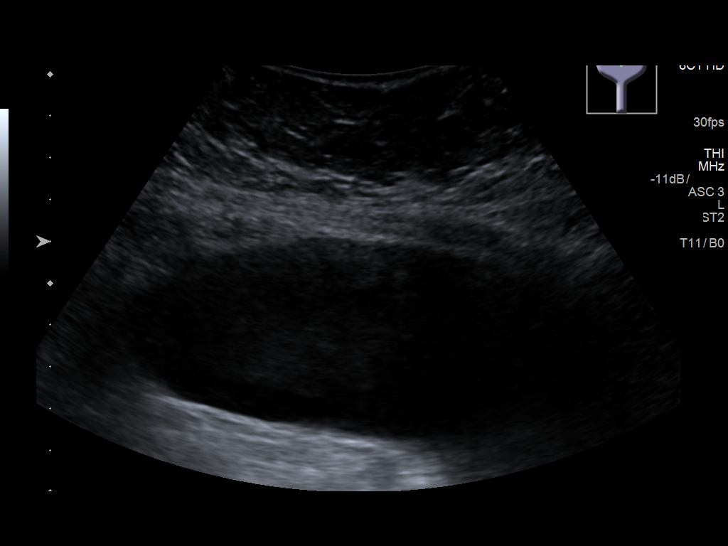

[14 of 25 positions shown; findings below may reference images not displayed]

FINDINGS: Right Kidney:

Length: 11.2 cm. Echogenicity within normal limits. No mass or
hydronephrosis visualized.

Left Kidney:

Length: 10.5 cm. 2.2 cm simple cyst is noted in midpole.
Echogenicity within normal limits. No mass or hydronephrosis
visualized.

Bladder:

Appears normal for degree of bladder distention. Bilateral ureteral
jets are noted.
IMPRESSION: Simple left renal cyst.  No other renal abnormality seen.

## 2020-04-09 NOTE — Telephone Encounter (Signed)
Spoke with patient. Paperwork faxed. Patient has been placed on Dr Clydell Hakim wait list for when he returns

## 2020-04-13 ENCOUNTER — Telehealth: Payer: Self-pay | Admitting: Internal Medicine

## 2020-04-13 NOTE — Telephone Encounter (Signed)
Patient called in wanted a call back about her clearance to [facility]

## 2020-04-14 NOTE — Telephone Encounter (Signed)
Clearance has been faxed. Patient is aware.

## 2020-04-18 ENCOUNTER — Other Ambulatory Visit: Payer: Self-pay | Admitting: Internal Medicine

## 2020-04-21 ENCOUNTER — Other Ambulatory Visit: Payer: Self-pay | Admitting: Internal Medicine

## 2020-04-21 MED ORDER — OLMESARTAN MEDOXOMIL 20 MG PO TABS: ORAL_TABLET | ORAL | 0 refills | Status: DC

## 2020-04-21 NOTE — Addendum Note (Signed)
Addended by: Elpidio Galea T on: 04/21/2020 01:13 PM   Modules accepted: Orders

## 2020-04-30 DIAGNOSIS — M25552 Pain in left hip: Secondary | ICD-10-CM | POA: Diagnosis not present

## 2020-04-30 DIAGNOSIS — M1611 Unilateral primary osteoarthritis, right hip: Secondary | ICD-10-CM | POA: Diagnosis not present

## 2020-04-30 DIAGNOSIS — G8929 Other chronic pain: Secondary | ICD-10-CM | POA: Diagnosis not present

## 2020-05-07 DIAGNOSIS — Z23 Encounter for immunization: Secondary | ICD-10-CM | POA: Diagnosis not present

## 2020-05-07 DIAGNOSIS — E7800 Pure hypercholesterolemia, unspecified: Secondary | ICD-10-CM | POA: Diagnosis not present

## 2020-05-07 DIAGNOSIS — I10 Essential (primary) hypertension: Secondary | ICD-10-CM | POA: Diagnosis not present

## 2020-05-08 ENCOUNTER — Ambulatory Visit: Payer: PPO

## 2020-05-11 ENCOUNTER — Ambulatory Visit (INDEPENDENT_AMBULATORY_CARE_PROVIDER_SITE_OTHER): Payer: PPO

## 2020-05-11 VITALS — Ht 63.0 in | Wt 172.0 lb

## 2020-05-11 DIAGNOSIS — Z0000 Encounter for general adult medical examination without abnormal findings: Secondary | ICD-10-CM | POA: Diagnosis not present

## 2020-05-11 NOTE — Patient Instructions (Addendum)
[name] , Thank you for taking time to come for your Medicare Wellness Visit. I appreciate your ongoing commitment to your health goals. Please review the following plan we discussed and let me know if I can assist you in the future.   These are the goals we discussed: Goals    . Follow up with Primary Care Provider     As needed       This is a list of the screening recommended for you and due dates:  Health Maintenance  Topic Date Due  . Mammogram  06/26/2020*  . COVID-19 Vaccine (1) 06/26/2020*  . Tetanus Vaccine  05/18/2028  . DEXA scan (bone density measurement)  Completed  . HPV Vaccine  Aged Out  . Flu Shot  Discontinued  . Pneumonia vaccines  Discontinued  *Topic was postponed. The date shown is not the original due date.    Immunizations Immunization History  Administered Date(s) Administered  . Influenza Inj Mdck Quad Pf 05/01/2018  . Influenza Split 10/29/2012  . Influenza, High Dose Seasonal PF 01/05/2017, 12/13/2018  . Pneumococcal Conjugate-13 10/29/2012  . Tdap 05/19/2018  . Zoster 02/21/2009   Keep all routine maintenance appointments.   Follow up 06/26/20 @ 11:30  Advanced directives: on file.  Conditions/risks identified: none new.   Follow up in one year for your annual wellness visit.   Preventive Care 29 Years and Older, Female Preventive care refers to lifestyle choices and visits with your health care provider that can promote health and wellness. What does preventive care include?  A yearly physical exam. This is also called an annual well check.  Dental exams once or twice a year.  Routine eye exams. Ask your health care provider how often you should have your eyes checked.  Personal lifestyle choices, including:  Daily care of your teeth and gums.  Regular physical activity.  Eating a healthy diet.  Avoiding tobacco and drug use.  Limiting alcohol use.  Practicing safe sex.  Taking low-dose aspirin every day.  Taking  vitamin and mineral supplements as recommended by your health care provider. What happens during an annual well check? The services and screenings done by your health care provider during your annual well check will depend on your age, overall health, lifestyle risk factors, and family history of disease. Counseling  Your health care provider may ask you questions about your:  Alcohol use.  Tobacco use.  Drug use.  Emotional well-being.  Home and relationship well-being.  Sexual activity.  Eating habits.  History of falls.  Memory and ability to understand (cognition).  Work and work Statistician.  Reproductive health. Screening  You may have the following tests or measurements:  Height, weight, and BMI.  Blood pressure.  Lipid and cholesterol levels. These may be checked every 5 years, or more frequently if you are over 21 years old.  Skin check.  Lung cancer screening. You may have this screening every year starting at age 72 if you have a 30-pack-year history of smoking and currently smoke or have quit within the past 15 years.  Fecal occult blood test (FOBT) of the stool. You may have this test every year starting at age 51.  Flexible sigmoidoscopy or colonoscopy. You may have a sigmoidoscopy every 5 years or a colonoscopy every 10 years starting at age 36.  Hepatitis C blood test.  Hepatitis B blood test.  Sexually transmitted disease (STD) testing.  Diabetes screening. This is done by checking your blood sugar (glucose) after  you have not eaten for a while (fasting). You may have this done every 1-3 years.  Bone density scan. This is done to screen for osteoporosis. You may have this done starting at age 37.  Mammogram. This may be done every 1-2 years. Talk to your health care provider about how often you should have regular mammograms. Talk with your health care provider about your test results, treatment options, and if necessary, the need for more  tests. Vaccines  Your health care provider may recommend certain vaccines, such as:  Influenza vaccine. This is recommended every year.  Tetanus, diphtheria, and acellular pertussis (Tdap, Td) vaccine. You may need a Td booster every 10 years.  Zoster vaccine. You may need this after age 51.  Pneumococcal 13-valent conjugate (PCV13) vaccine. One dose is recommended after age 73.  Pneumococcal polysaccharide (PPSV23) vaccine. One dose is recommended after age 24. Talk to your health care provider about which screenings and vaccines you need and how often you need them. This information is not intended to replace advice given to you by your health care provider. Make sure you discuss any questions you have with your health care provider. Document Released: 03/06/2015 Document Revised: 10/28/2015 Document Reviewed: 12/09/2014 Elsevier Interactive Patient Education  2017 [place] Prevention in the Home Falls can cause injuries. They can happen to people of all ages. There are many things you can do to make your home safe and to help prevent falls. What can I do on the outside of my home?  Regularly fix the edges of walkways and driveways and fix any cracks.  Remove anything that might make you trip as you walk through a door, such as a raised step or threshold.  Trim any bushes or trees on the path to your home.  Use bright outdoor lighting.  Clear any walking paths of anything that might make someone trip, such as rocks or tools.  Regularly check to see if handrails are loose or broken. Make sure that both sides of any steps have handrails.  Any raised decks and porches should have guardrails on the edges.  Have any leaves, snow, or ice cleared regularly.  Use sand or salt on walking paths during winter.  Clean up any spills in your garage right away. This includes oil or grease spills. What can I do in the bathroom?  Use night lights.  Install grab bars by the  toilet and in the tub and shower. Do not use towel bars as grab bars.  Use non-skid mats or decals in the tub or shower.  If you need to sit down in the shower, use a plastic, non-slip stool.  Keep the floor dry. Clean up any water that spills on the floor as soon as it happens.  Remove soap buildup in the tub or shower regularly.  Attach bath mats securely with double-sided non-slip rug tape.  Do not have throw rugs and other things on the floor that can make you trip. What can I do in the bedroom?  Use night lights.  Make sure that you have a light by your bed that is easy to reach.  Do not use any sheets or blankets that are too big for your bed. They should not hang down onto the floor.  Have a firm chair that has side arms. You can use this for support while you get dressed.  Do not have throw rugs and other things on the floor that can make you trip.  What can I do in the kitchen?  Clean up any spills right away.  Avoid walking on wet floors.  Keep items that you use a lot in easy-to-reach places.  If you need to reach something above you, use a strong step stool that has a grab bar.  Keep electrical cords out of the way.  Do not use floor polish or wax that makes floors slippery. If you must use wax, use non-skid floor wax.  Do not have throw rugs and other things on the floor that can make you trip. What can I do with my stairs?  Do not leave any items on the stairs.  Make sure that there are handrails on both sides of the stairs and use them. Fix handrails that are broken or loose. Make sure that handrails are as long as the stairways.  Check any carpeting to make sure that it is firmly attached to the stairs. Fix any carpet that is loose or worn.  Avoid having throw rugs at the top or bottom of the stairs. If you do have throw rugs, attach them to the floor with carpet tape.  Make sure that you have a light switch at the top of the stairs and the bottom of  the stairs. If you do not have them, ask someone to add them for you. What else can I do to help prevent falls?  Wear shoes that:  Do not have high heels.  Have rubber bottoms.  Are comfortable and fit you well.  Are closed at the toe. Do not wear sandals.  If you use a stepladder:  Make sure that it is fully opened. Do not climb a closed stepladder.  Make sure that both sides of the stepladder are locked into place.  Ask someone to hold it for you, if possible.  Clearly mark and make sure that you can see:  Any grab bars or handrails.  First and last steps.  Where the edge of each step is.  Use tools that help you move around (mobility aids) if they are needed. These include:  Canes.  Walkers.  Scooters.  Crutches.  Turn on the lights when you go into a dark area. Replace any light bulbs as soon as they burn out.  Set up your furniture so you have a clear path. Avoid moving your furniture around.  If any of your floors are uneven, fix them.  If there are any pets around you, be aware of where they are.  Review your medicines with your doctor. Some medicines can make you feel dizzy. This can increase your chance of falling. Ask your doctor what other things that you can do to help prevent falls. This information is not intended to replace advice given to you by your health care provider. Make sure you discuss any questions you have with your health care provider. Document Released: 12/04/2008 Document Revised: 07/16/2015 Document Reviewed: 03/14/2014 Elsevier Interactive Patient Education  2017 [employer].

## 2020-05-11 NOTE — Progress Notes (Addendum)
Subjective:   Alysha Doolan is a 80 y.o. female who presents for Medicare Annual (Subsequent) preventive examination.  Review of Systems    No ROS.  Medicare Wellness Virtual Visit.    Cardiac Risk Factors include: advanced age (>31men, >62 women);hypertension     Objective:    Today's Vitals   05/11/20 1233  Weight: 172 lb (78 kg)  Height: 5\' 3"  (1.6 m)   Body mass index is 30.47 kg/m.  Advanced Directives 05/11/2020 05/08/2019 05/19/2018 04/30/2018 05/09/2017 04/26/2017 04/25/2016  Does Patient Have a Medical Advance Directive? Yes Yes Yes Yes No;Yes Yes Yes  Type of Paramedic of [place];Living will [place];Living will Healthcare Power of [place];Living will Healthcare Power of [place];Living will Living will;Healthcare Power of Attorney  Does patient want to make changes to medical advance directive? No - Patient declined No - Patient declined No - Patient declined No - Patient declined No - Patient declined No - Patient declined No - Patient declined  Copy of [place] in Chart? Yes - validated most recent copy scanned in chart (See row information) Yes - validated most recent copy scanned in chart (See row information) Yes - validated most recent copy scanned in chart (See row information) Yes - validated most recent copy scanned in chart (See row information) Yes Yes Yes  Would patient like information on creating a medical advance directive? - - - - No - Patient declined - -    Current Medications (verified) Outpatient Encounter Medications as of 05/11/2020  Medication Sig  . acetaminophen (TYLENOL) 325 MG tablet Take 650 mg by mouth every 4 (four) hours as needed.  . Alpha-Lipoic Acid 200 MG CAPS Take 200 mg by mouth daily.  Marland Kitchen Bioflavonoid Products (BIOFLEX PO) Take 1 tablet by mouth daily.   . fish oil-omega-3 fatty acids 1000 MG capsule Take 1 g by  mouth 3 (three) times daily.  . Multiple Vitamin (MULTIVITAMIN) tablet Take 1 tablet by mouth daily.  Marland Kitchen olmesartan (BENICAR) 20 MG tablet Take 1 tablet (20 mg total) by mouth daily. PLEASE SPECIFY DIRECTIONS, REFILLS AND QUANTITY  . QUERCETIN PO Take 500 mg by mouth daily.  Marland Kitchen spironolactone (ALDACTONE) 25 MG tablet TAKE 1 TABLET BY MOUTH EVERY DAY  . Turmeric 500 MG TABS Take 500 mg by mouth 3 (three) times daily.   No facility-administered encounter medications on file as of 05/11/2020.    Allergies (verified) Norvasc [amlodipine besylate], Contrast media [iodinated diagnostic agents], Penicillins, Codeine, Diclofenac, Felodipine, Lipitor [atorvastatin], Stay awake [caffeine], Zocor [simvastatin], and Benicar [olmesartan]   History: Past Medical History:  Diagnosis Date  . Anemia   . Diverticulosis   . GERD (gastroesophageal reflux disease)   . Glaucoma   . Hypercholesterolemia   . Hypertension   . Thyroid nodule    Past Surgical History:  Procedure Laterality Date  . ABDOMINAL HYSTERECTOMY  1987   fibroids, ovaries not removed  . TONSILLECTOMY     Family History  Problem Relation Age of Onset  . Diabetes Father   . Hypertension Father   . Hypercholesterolemia Father   . Heart disease Father        CABG  . Hypertension Mother   . Hypercholesterolemia Mother   . Rheumatic fever Mother   . Hypertension Brother   . Diabetes Sister   . Breast cancer Maternal Aunt   . Breast cancer Cousin   . Colon cancer  Neg Hx    Social History   Socioeconomic History  . Marital status: Divorced    Spouse name: Not on file  . Number of children: 2  . Years of education: Not on file  . Highest education level: Not on file  Occupational History  . Not on file  Tobacco Use  . Smoking status: Never Smoker  . Smokeless tobacco: Never Used  Vaping Use  . Vaping Use: Never used  Substance and Sexual Activity  . Alcohol use: No    Alcohol/week: 0.0 standard drinks  . Drug use: No   . Sexual activity: Not Currently  Other Topics Concern  . Not on file  Social History Narrative  . Not on file   Social Determinants of Health   Financial Resource Strain: Low Risk   . Difficulty of Paying Living Expenses: Not hard at all  Food Insecurity: No Food Insecurity  . Worried About Charity fundraiser in the Last Year: Never true  . Ran Out of Food in the Last Year: Never true  Transportation Needs: No Transportation Needs  . Lack of Transportation (Medical): No  . Lack of Transportation (Non-Medical): No  Physical Activity: Unknown  . Days of Exercise per Week: 0 days  . Minutes of Exercise per Session: Not on file  Stress: No Stress Concern Present  . Feeling of Stress : Not at all  Social Connections: Unknown  . Frequency of Communication with Friends and Family: More than three times a week  . Frequency of Social Gatherings with Friends and Family: More than three times a week  . Attends Religious Services: Not on file  . Active Member of Clubs or Organizations: Not on file  . Attends Archivist Meetings: Not on file  . Marital Status: Not on file    Tobacco Counseling Counseling given: Not Answered   Clinical Intake:  Pre-visit preparation completed: Yes        Diabetes: No  How often do you need to have someone help you when you read instructions, pamphlets, or other written materials from your doctor or pharmacy?: 1 - Never    Interpreter Needed?: No      Activities of Daily Living In your present state of health, do you have any difficulty performing the following activities: 05/11/2020  Hearing? N  Vision? N  Difficulty concentrating or making decisions? N  Walking or climbing stairs? N  Dressing or bathing? N  Doing errands, shopping? N  Preparing Food and eating ? Y  Comment Family assist meal prep. Self feed.  Using the Toilet? N  In the past six months, have you accidently leaked urine? N  Do you have problems with  loss of bowel control? N  Managing your Medications? N  Managing your Finances? N  Housekeeping or managing your Housekeeping? Y  Comment Family assist.  Some recent data might be hidden    Patient Care Team: Einar Pheasant, MD as PCP - General (Internal Medicine)  Indicate any recent Medical Services you may have received from other than [facility] providers in the past year (date may be approximate).     Assessment:   This is a routine wellness examination for Charyl.  I connected with Bronwyn today by telephone and verified that I am speaking with the correct person using two identifiers. Location patient: home Location provider: work Persons participating in the virtual visit: patient, Marine scientist.    I discussed the limitations, risks, security and privacy concerns of performing  an evaluation and management service by telephone and the availability of in person appointments. The patient expressed understanding and verbally consented to this telephonic visit.    Interactive audio and video telecommunications were attempted between this provider and patient, however failed, due to patient having technical difficulties OR patient did not have access to video capability.  We continued and completed visit with audio only.  Some vital signs may be absent or patient reported.   Hearing/Vision screen  Hearing Screening   125Hz  250Hz  500Hz  1000Hz  2000Hz  3000Hz  4000Hz  6000Hz  8000Hz   Right ear:           Left ear:           Comments: Patient is able to hear conversational tones without difficulty.  No issues reported.  Vision Screening Comments: Followed by [facility]  Wears corrective lenses  Visits every 6 months  Cataract extraction, bilateral  Dietary issues and exercise activities discussed: Current Exercise Habits: Home exercise routine, Type of exercise: walking (Physical therapy), Time (Minutes): 10, Frequency (Times/Week): 7, Weekly Exercise (Minutes/Week): 70, Intensity: Mild   Healthy diet Good water intake  Goals    . Follow up with Primary Care Provider     As needed      Depression Screen PHQ 2/9 Scores 05/11/2020 05/08/2019 04/30/2018 04/26/2017 04/25/2016 01/11/2016 08/21/2015  PHQ - 2 Score 0 0 0 0 0 0 0    Fall Risk Fall Risk  05/11/2020 10/17/2019 05/08/2019 04/30/2018 04/26/2017  Falls in the past year? 0 0 0 0 No  Number falls in past yr: 0 - - - -  Injury with Fall? 0 - - - -  Follow up Falls evaluation completed Falls evaluation completed Falls evaluation completed - -    FALL RISK PREVENTION PERTAINING TO THE HOME: Handrails in use when climbing stairs? Yes Home free of loose throw rugs in walkways, pet beds, electrical cords, etc? Yes  Adequate lighting in your home to reduce risk of falls? Yes   ASSISTIVE DEVICES UTILIZED TO PREVENT FALLS: Life alert? No  Use of a cane, walker or w/c? Yes  Grab bars in the bathroom? Yes  Shower chair or bench in shower? Yes  Elevated toilet? Yes   TIMED UP AND GO: Was the test performed? No .  Virtual visit.   Cognitive Function: MMSE - Mini Mental State Exam 04/26/2017 04/25/2016 02/26/2015  Orientation to time 5 5 5   Orientation to Place 5 5 5   Registration 3 3 3   Attention/ Calculation 5 5 5   Recall 3 3 3   Language- name 2 objects 2 2 2   Language- repeat 1 1 1   Language- follow 3 step command 3 3 3   Language- read & follow direction 1 1 1   Write a sentence 1 1 1   Copy design 1 1 1   Total score 30 30 30      6CIT Screen 05/11/2020 05/08/2019 04/30/2018  What Year? 0 points 0 points 0 points  What month? 0 points 0 points 0 points  What time? 0 points 0 points 0 points  Count back from 20 0 points 0 points 0 points  Months in reverse 0 points 0 points 0 points  Repeat phrase - 0 points 0 points  Total Score - 0 0   Immunizations Immunization History  Administered Date(s) Administered  . Influenza Inj Mdck Quad Pf 05/01/2018  . Influenza Split 10/29/2012  . Influenza, High Dose Seasonal PF  01/05/2017, 12/13/2018  . Pneumococcal Conjugate-13 10/29/2012  . Tdap  05/19/2018  . Zoster 02/21/2009   Health Maintenance Health Maintenance  Topic Date Due  . MAMMOGRAM  06/26/2020 (Originally 05/09/2020)  . COVID-19 Vaccine (1) 06/26/2020 (Originally 08/16/1945)  . TETANUS/TDAP  05/18/2028  . DEXA SCAN  Completed  . HPV VACCINES  Aged Out  . INFLUENZA VACCINE  Discontinued  . PNA vac Low Risk Adult  Discontinued   Cologuard- she wants to hold off.  Mammogram- deferred. She wants to hold off.   Lung Cancer Screening: (Low Dose CT Chest recommended if Age 54-80 years, 30 pack-year currently smoking OR have quit w/in 15years.) does not qualify.   Dental Screening: Recommended annual dental exams for proper oral hygiene.  Community Resource Referral / Chronic Care Management: CRR required this visit?  No   CCM required this visit?  No      Plan:   Keep all routine maintenance appointments.   Follow up 06/26/20 @ 11:30  I have personally reviewed and noted the following in the patient's chart:   . Medical and social history . Use of alcohol, tobacco or illicit drugs  . Current medications and supplements . Functional ability and status . Nutritional status . Physical activity . Advanced directives . List of other physicians . Hospitalizations, surgeries, and ER visits in previous 12 months . Vitals . Screenings to include cognitive, depression, and falls . Referrals and appointments  In addition, I have reviewed and discussed with patient certain preventive protocols, quality metrics, and best practice recommendations. A written personalized care plan for preventive services as well as general preventive health recommendations were provided to patient via mail.     Varney Biles, LPN   [id number]      Agree with plan. Mable Paris, NP

## 2020-05-17 ENCOUNTER — Other Ambulatory Visit: Payer: Self-pay | Admitting: Internal Medicine

## 2020-06-03 DIAGNOSIS — E7800 Pure hypercholesterolemia, unspecified: Secondary | ICD-10-CM | POA: Diagnosis not present

## 2020-06-03 DIAGNOSIS — Z01818 Encounter for other preprocedural examination: Secondary | ICD-10-CM | POA: Diagnosis not present

## 2020-06-03 DIAGNOSIS — I10 Essential (primary) hypertension: Secondary | ICD-10-CM | POA: Diagnosis not present

## 2020-06-03 DIAGNOSIS — E042 Nontoxic multinodular goiter: Secondary | ICD-10-CM | POA: Diagnosis not present

## 2020-06-03 DIAGNOSIS — N189 Chronic kidney disease, unspecified: Secondary | ICD-10-CM | POA: Diagnosis not present

## 2020-06-03 DIAGNOSIS — M1611 Unilateral primary osteoarthritis, right hip: Secondary | ICD-10-CM | POA: Diagnosis not present

## 2020-06-15 ENCOUNTER — Other Ambulatory Visit: Payer: Self-pay | Admitting: Internal Medicine

## 2020-06-15 ENCOUNTER — Telehealth: Payer: Self-pay | Admitting: Internal Medicine

## 2020-06-15 NOTE — Telephone Encounter (Signed)
PT called to inform that Dr.Scott Claiborne Billings at [facility], [place] is requesting clearance to do surgery on her.

## 2020-06-16 NOTE — Telephone Encounter (Signed)
Ok to schedule appt with me.  Agree, needs cardiology clearance prior to surgery.

## 2020-06-16 NOTE — Telephone Encounter (Signed)
Patient is scheduled to have hip surgery may 9. I have advised her to call cardiology to confirm clear for surgery. She sees ortho tomorrow for a pre-op. Just wanted to be sure she does not need an appt with [place]?

## 2020-06-17 DIAGNOSIS — M1611 Unilateral primary osteoarthritis, right hip: Secondary | ICD-10-CM | POA: Diagnosis not present

## 2020-06-17 NOTE — Telephone Encounter (Signed)
LMTCB

## 2020-06-19 NOTE — Telephone Encounter (Signed)
Per patient, cardiology has cleared her. She saw Dr Claiborne Billings yesterday and stated that she has done everything that was needed and signed everything to have her surgery done on May 9th. Dr Claiborne Billings was able to access note from cardiology. Just wanted to confirm nothing further needed.

## 2020-06-19 NOTE — Telephone Encounter (Signed)
If cardiology has cleared and she is doing ok otherwise and Dr Claiborne Billings has everything he needs - then I am not aware of anything more she needs.  Tell her to let us know how the surgery goes.

## 2020-06-19 NOTE — Telephone Encounter (Signed)
Pt is aware.  

## 2020-06-26 ENCOUNTER — Ambulatory Visit: Payer: PPO | Admitting: Internal Medicine

## 2020-06-27 DIAGNOSIS — Z20822 Contact with and (suspected) exposure to covid-19: Secondary | ICD-10-CM | POA: Diagnosis not present

## 2020-06-29 DIAGNOSIS — R5382 Chronic fatigue, unspecified: Secondary | ICD-10-CM | POA: Diagnosis not present

## 2020-06-29 DIAGNOSIS — Z96641 Presence of right artificial hip joint: Secondary | ICD-10-CM | POA: Diagnosis not present

## 2020-06-29 DIAGNOSIS — M1611 Unilateral primary osteoarthritis, right hip: Secondary | ICD-10-CM | POA: Diagnosis not present

## 2020-06-29 DIAGNOSIS — E7800 Pure hypercholesterolemia, unspecified: Secondary | ICD-10-CM | POA: Diagnosis not present

## 2020-06-29 DIAGNOSIS — Z7982 Long term (current) use of aspirin: Secondary | ICD-10-CM | POA: Diagnosis not present

## 2020-06-29 DIAGNOSIS — E042 Nontoxic multinodular goiter: Secondary | ICD-10-CM | POA: Diagnosis not present

## 2020-06-29 DIAGNOSIS — N189 Chronic kidney disease, unspecified: Secondary | ICD-10-CM | POA: Diagnosis not present

## 2020-06-29 DIAGNOSIS — Z79899 Other long term (current) drug therapy: Secondary | ICD-10-CM | POA: Diagnosis not present

## 2020-06-29 DIAGNOSIS — Z471 Aftercare following joint replacement surgery: Secondary | ICD-10-CM | POA: Diagnosis not present

## 2020-06-29 DIAGNOSIS — I10 Essential (primary) hypertension: Secondary | ICD-10-CM | POA: Diagnosis not present

## 2020-06-29 DIAGNOSIS — E785 Hyperlipidemia, unspecified: Secondary | ICD-10-CM | POA: Diagnosis not present

## 2020-06-29 DIAGNOSIS — M25751 Osteophyte, right hip: Secondary | ICD-10-CM | POA: Diagnosis not present

## 2020-06-29 DIAGNOSIS — I129 Hypertensive chronic kidney disease with stage 1 through stage 4 chronic kidney disease, or unspecified chronic kidney disease: Secondary | ICD-10-CM | POA: Diagnosis not present

## 2020-06-29 DIAGNOSIS — R7301 Impaired fasting glucose: Secondary | ICD-10-CM | POA: Diagnosis not present

## 2020-06-29 DIAGNOSIS — E875 Hyperkalemia: Secondary | ICD-10-CM | POA: Diagnosis not present

## 2020-06-29 DIAGNOSIS — D72829 Elevated white blood cell count, unspecified: Secondary | ICD-10-CM | POA: Diagnosis not present

## 2020-06-30 DIAGNOSIS — I10 Essential (primary) hypertension: Secondary | ICD-10-CM | POA: Diagnosis not present

## 2020-06-30 DIAGNOSIS — N189 Chronic kidney disease, unspecified: Secondary | ICD-10-CM | POA: Diagnosis not present

## 2020-06-30 DIAGNOSIS — D72829 Elevated white blood cell count, unspecified: Secondary | ICD-10-CM | POA: Diagnosis not present

## 2020-07-08 ENCOUNTER — Ambulatory Visit: Payer: PPO | Admitting: Internal Medicine

## 2020-07-12 ENCOUNTER — Other Ambulatory Visit: Payer: Self-pay | Admitting: Internal Medicine

## 2020-07-14 ENCOUNTER — Ambulatory Visit (INDEPENDENT_AMBULATORY_CARE_PROVIDER_SITE_OTHER): Payer: PPO | Admitting: Internal Medicine

## 2020-07-14 ENCOUNTER — Other Ambulatory Visit: Payer: Self-pay

## 2020-07-14 VITALS — BP 138/84 | HR 84 | Temp 97.9°F | Resp 16 | Ht 63.0 in | Wt 176.0 lb

## 2020-07-14 DIAGNOSIS — R739 Hyperglycemia, unspecified: Secondary | ICD-10-CM | POA: Diagnosis not present

## 2020-07-14 DIAGNOSIS — I10 Essential (primary) hypertension: Secondary | ICD-10-CM | POA: Diagnosis not present

## 2020-07-14 DIAGNOSIS — D649 Anemia, unspecified: Secondary | ICD-10-CM

## 2020-07-14 DIAGNOSIS — D72829 Elevated white blood cell count, unspecified: Secondary | ICD-10-CM | POA: Diagnosis not present

## 2020-07-14 DIAGNOSIS — N1831 Chronic kidney disease, stage 3a: Secondary | ICD-10-CM

## 2020-07-14 DIAGNOSIS — E871 Hypo-osmolality and hyponatremia: Secondary | ICD-10-CM

## 2020-07-14 DIAGNOSIS — E7800 Pure hypercholesterolemia, unspecified: Secondary | ICD-10-CM

## 2020-07-14 LAB — CBC WITH DIFFERENTIAL/PLATELET
Basophils Absolute: 0.1 10*3/uL (ref 0.0–0.1)
Basophils Relative: 1.1 % (ref 0.0–3.0)
Eosinophils Absolute: 0.2 10*3/uL (ref 0.0–0.7)
Eosinophils Relative: 1.6 % (ref 0.0–5.0)
HCT: 30.2 % — ABNORMAL LOW (ref 36.0–46.0)
Hemoglobin: 10.2 g/dL — ABNORMAL LOW (ref 12.0–15.0)
Lymphocytes Relative: 20.3 % (ref 12.0–46.0)
Lymphs Abs: 2.2 10*3/uL (ref 0.7–4.0)
MCHC: 33.7 g/dL (ref 30.0–36.0)
MCV: 92 fl (ref 78.0–100.0)
Monocytes Absolute: 0.7 10*3/uL (ref 0.1–1.0)
Monocytes Relative: 6.1 % (ref 3.0–12.0)
Neutro Abs: 7.7 10*3/uL (ref 1.4–7.7)
Neutrophils Relative %: 70.9 % (ref 43.0–77.0)
Platelets: 404 10*3/uL — ABNORMAL HIGH (ref 150.0–400.0)
RBC: 3.28 Mil/uL — ABNORMAL LOW (ref 3.87–5.11)
RDW: 15.3 % (ref 11.5–15.5)
WBC: 10.9 10*3/uL — ABNORMAL HIGH (ref 4.0–10.5)

## 2020-07-14 LAB — BASIC METABOLIC PANEL
BUN: 21 mg/dL (ref 6–23)
CO2: 25 mEq/L (ref 19–32)
Calcium: 9.1 mg/dL (ref 8.4–10.5)
Chloride: 100 mEq/L (ref 96–112)
Creatinine, Ser: 1.04 mg/dL (ref 0.40–1.20)
GFR: 50.26 mL/min — ABNORMAL LOW (ref >60.00)
Glucose, Bld: 92 mg/dL (ref 70–99)
Potassium: 5.1 mEq/L (ref 3.5–5.1)
Sodium: 134 mEq/L — ABNORMAL LOW (ref 135–145)

## 2020-07-14 LAB — VITAMIN B12: Vitamin B-12: 201 pg/mL — ABNORMAL LOW (ref 211–911)

## 2020-07-14 LAB — FERRITIN: Ferritin: 257.6 ng/mL (ref 10.0–291.0)

## 2020-07-14 NOTE — Progress Notes (Signed)
Patient ID: Cathy Vazquez, female   DOB: 01-05-1941, 80 y.o.   MRN: [id number]   Subjective:    Patient ID: Cathy Vazquez, female    DOB: 1940/09/26, 80 y.o.   MRN: [id number]  HPI This visit occurred during the SARS-CoV-2 public health emergency.  Safety protocols were in place, including screening questions prior to the visit, additional usage of staff PPE, and extensive cleaning of exam room while observing appropriate contact time as indicated for disinfecting solutions.  Patient here for a scheduled follow up. She is s/p right hip surgery 06/29/20 - Dr Claiborne Billings. Discharged with hgb 10.2.  On iron supplements per report.  Sodium was low.  It was recommended for her to continue benicar and hold spironolactone.  Needs f/u blood pressure and labs today.  Receiving home PT.  States overall she feels she is doing relatively well.  Pain is manageable.  No chest pain.  No sob reported.  No abdominal pain.  Bowels moving.  Swelling is better.    Past Medical History:  Diagnosis Date  . Anemia   . Diverticulosis   . GERD (gastroesophageal reflux disease)   . Glaucoma   . Hypercholesterolemia   . Hypertension   . Thyroid nodule    Past Surgical History:  Procedure Laterality Date  . ABDOMINAL HYSTERECTOMY  1987   fibroids, ovaries not removed  . TONSILLECTOMY     Family History  Problem Relation Age of Onset  . Diabetes Father   . Hypertension Father   . Hypercholesterolemia Father   . Heart disease Father        CABG  . Hypertension Mother   . Hypercholesterolemia Mother   . Rheumatic fever Mother   . Hypertension Brother   . Diabetes Sister   . Breast cancer Maternal Aunt   . Breast cancer Cousin   . Colon cancer Neg Hx    Social History   Socioeconomic History  . Marital status: Divorced    Spouse name: Not on file  . Number of children: 2  . Years of education: Not on file  . Highest education level: Not on file  Occupational History  . Not on file  Tobacco Use   . Smoking status: Never Smoker  . Smokeless tobacco: Never Used  Vaping Use  . Vaping Use: Never used  Substance and Sexual Activity  . Alcohol use: No    Alcohol/week: 0.0 standard drinks  . Drug use: No  . Sexual activity: Not Currently  Other Topics Concern  . Not on file  Social History Narrative  . Not on file   Social Determinants of Health   Financial Resource Strain: Low Risk   . Difficulty of Paying Living Expenses: Not hard at all  Food Insecurity: No Food Insecurity  . Worried About Charity fundraiser in the Last Year: Never true  . Ran Out of Food in the Last Year: Never true  Transportation Needs: No Transportation Needs  . Lack of Transportation (Medical): No  . Lack of Transportation (Non-Medical): No  Physical Activity: Unknown  . Days of Exercise per Week: 0 days  . Minutes of Exercise per Session: Not on file  Stress: No Stress Concern Present  . Feeling of Stress : Not at all  Social Connections: Unknown  . Frequency of Communication with Friends and Family: More than three times a week  . Frequency of Social Gatherings with Friends and Family: More than three times a week  . Attends Religious  Services: Not on file  . Active Member of Clubs or Organizations: Not on file  . Attends Archivist Meetings: Not on file  . Marital Status: Not on file    Outpatient Encounter Medications as of 07/14/2020  Medication Sig  . acetaminophen (TYLENOL) 325 MG tablet Take 650 mg by mouth every 4 (four) hours as needed.  . Alpha-Lipoic Acid 200 MG CAPS Take 200 mg by mouth daily.  Marland Kitchen Bioflavonoid Products (BIOFLEX PO) Take 1 tablet by mouth daily.   . fish oil-omega-3 fatty acids 1000 MG capsule Take 1 g by mouth 3 (three) times daily.  . Multiple Vitamin (MULTIVITAMIN) tablet Take 1 tablet by mouth daily.  Marland Kitchen olmesartan (BENICAR) 20 MG tablet TAKE 1 TABLET BY MOUTH EVERY DAY  . QUERCETIN PO Take 500 mg by mouth daily.  Marland Kitchen spironolactone (ALDACTONE) 25 MG  tablet TAKE 1 TABLET BY MOUTH EVERY DAY  . Turmeric 500 MG TABS Take 500 mg by mouth 3 (three) times daily.   No facility-administered encounter medications on file as of 07/14/2020.    Review of Systems  Constitutional: Negative for appetite change and unexpected weight change.  HENT: Negative for congestion and sinus pressure.   Respiratory: Negative for cough, chest tightness and shortness of breath.   Cardiovascular: Negative for chest pain and palpitations.       Swelling from surgery - improved.    Gastrointestinal: Negative for abdominal pain, diarrhea, nausea and vomiting.  Genitourinary: Negative for difficulty urinating and dysuria.  Musculoskeletal: Negative for myalgias.       Pain has improved.  Swelling better.    Skin: Negative for color change and rash.  Neurological: Negative for dizziness, light-headedness and headaches.  Psychiatric/Behavioral: Negative for agitation and dysphoric mood.       Objective:    Physical Exam Vitals reviewed.  Constitutional:      General: She is not in acute distress.    Appearance: Normal appearance.  HENT:     Head: Normocephalic and atraumatic.     Right Ear: External ear normal.     Left Ear: External ear normal.  Eyes:     General: No scleral icterus.       Right eye: No discharge.        Left eye: No discharge.     Conjunctiva/sclera: Conjunctivae normal.  Neck:     Thyroid: No thyromegaly.  Cardiovascular:     Rate and Rhythm: Normal rate and regular rhythm.  Pulmonary:     Effort: No respiratory distress.     Breath sounds: Normal breath sounds. No wheezing.  Abdominal:     General: Bowel sounds are normal.     Palpations: Abdomen is soft.     Tenderness: There is no abdominal tenderness.  Musculoskeletal:        General: No tenderness.     Cervical back: Neck supple. No tenderness.     Comments: Lower extremity swelling - improved.  No increased erythema.   Lymphadenopathy:     Cervical: No cervical  adenopathy.  Skin:    Findings: No erythema or rash.  Neurological:     Mental Status: She is alert.  Psychiatric:        Mood and Affect: Mood normal.        Behavior: Behavior normal.     BP 138/84   Pulse 84   Temp 97.9 F (36.6 C)   Resp 16   Ht $R'5\' 3"'Dl$  (1.6 m)   Wt 176  lb (79.8 kg)   LMP 02/21/1984   SpO2 98%   BMI 31.18 kg/m  Wt Readings from Last 3 Encounters:  07/14/20 176 lb (79.8 kg)  05/11/20 172 lb (78 kg)  03/26/20 172 lb (78 kg)     Lab Results  Component Value Date   WBC 10.9 (H) 07/14/2020   HGB 10.2 (L) 07/14/2020   HCT 30.2 (L) 07/14/2020   PLT 404.0 (H) 07/14/2020   GLUCOSE 92 07/14/2020   CHOL 222 (H) 02/10/2020   TRIG 253.0 (H) 02/10/2020   HDL 38.80 (L) 02/10/2020   LDLDIRECT 150.0 02/10/2020   LDLCALC 165 (H) 01/25/2019   ALT 14 02/10/2020   AST 12 02/10/2020   NA 134 (L) 07/14/2020   K 5.1 07/14/2020   CL 100 07/14/2020   CREATININE 1.04 07/14/2020   BUN 21 07/14/2020   CO2 25 07/14/2020   TSH 2.48 06/07/2019   HGBA1C 6.1 02/10/2020    US BREAST LTD UNI LEFT INC AXILLA  Result Date: 05/10/2019 CLINICAL DATA:  Possible mass in the 12 o'clock position of the right breast posteriorly and possible mass in the upper outer left breast posteriorly on a recent screening mammogram. EXAM: DIGITAL DIAGNOSTIC BILATERAL MAMMOGRAM WITH TOMO ULTRASOUND BILATERAL BREAST COMPARISON:  Previous exam(s). ACR Breast Density Category c: The breast tissue is heterogeneously dense, which may obscure small masses. FINDINGS: 3D tomographic and 2D generated spot compression images of both breasts demonstrate an approximately 1 cm oval, partially circumscribed and partially obscured mass in the superior aspect of right breast in the oblique projection, not seen in the craniocaudal projection. There is also an approximately 1 cm oval, partially circumscribed and partially obscured mass in the posterior aspect of the upper-outer left breast in approximately the 2 to 3  o'clock position. Targeted ultrasound is performed, showing a 1.4 cm cyst containing a small amount of internal echoes and partial thin internal septation in the 11:30 o'clock position of the right breast, 3 cm from the nipple, corresponding to the mammographic mass. A 9 mm simple cyst is demonstrated in the 2 o'clock position of the left breast, 8 cm from the nipple, corresponding to the mammographic mass. There is also a nearby 6 mm cyst containing a thin partial internal septation. IMPRESSION: Benign bilateral breast cysts.  No evidence of malignancy. RECOMMENDATION: Bilateral screening mammogram in 1 year. I have discussed the findings and recommendations with the patient. If applicable, a reminder letter will be sent to the patient regarding the next appointment. BI-RADS CATEGORY  2: Benign. Electronically Signed   By: Claudie Revering M.D.   On: 05/10/2019 14:37   US BREAST LTD UNI RIGHT INC AXILLA  Result Date: 05/10/2019 CLINICAL DATA:  Possible mass in the 12 o'clock position of the right breast posteriorly and possible mass in the upper outer left breast posteriorly on a recent screening mammogram. EXAM: DIGITAL DIAGNOSTIC BILATERAL MAMMOGRAM WITH TOMO ULTRASOUND BILATERAL BREAST COMPARISON:  Previous exam(s). ACR Breast Density Category c: The breast tissue is heterogeneously dense, which may obscure small masses. FINDINGS: 3D tomographic and 2D generated spot compression images of both breasts demonstrate an approximately 1 cm oval, partially circumscribed and partially obscured mass in the superior aspect of right breast in the oblique projection, not seen in the craniocaudal projection. There is also an approximately 1 cm oval, partially circumscribed and partially obscured mass in the posterior aspect of the upper-outer left breast in approximately the 2 to 3 o'clock position. Targeted ultrasound is performed, showing a 1.4  cm cyst containing a small amount of internal echoes and partial thin internal  septation in the 11:30 o'clock position of the right breast, 3 cm from the nipple, corresponding to the mammographic mass. A 9 mm simple cyst is demonstrated in the 2 o'clock position of the left breast, 8 cm from the nipple, corresponding to the mammographic mass. There is also a nearby 6 mm cyst containing a thin partial internal septation. IMPRESSION: Benign bilateral breast cysts.  No evidence of malignancy. RECOMMENDATION: Bilateral screening mammogram in 1 year. I have discussed the findings and recommendations with the patient. If applicable, a reminder letter will be sent to the patient regarding the next appointment. BI-RADS CATEGORY  2: Benign. Electronically Signed   By: Beckie Salts M.D.   On: 05/10/2019 14:37   MM DIAG BREAST TOMO BILATERAL  Result Date: 05/10/2019 CLINICAL DATA:  Possible mass in the 12 o'clock position of the right breast posteriorly and possible mass in the upper outer left breast posteriorly on a recent screening mammogram. EXAM: DIGITAL DIAGNOSTIC BILATERAL MAMMOGRAM WITH TOMO ULTRASOUND BILATERAL BREAST COMPARISON:  Previous exam(s). ACR Breast Density Category c: The breast tissue is heterogeneously dense, which may obscure small masses. FINDINGS: 3D tomographic and 2D generated spot compression images of both breasts demonstrate an approximately 1 cm oval, partially circumscribed and partially obscured mass in the superior aspect of right breast in the oblique projection, not seen in the craniocaudal projection. There is also an approximately 1 cm oval, partially circumscribed and partially obscured mass in the posterior aspect of the upper-outer left breast in approximately the 2 to 3 o'clock position. Targeted ultrasound is performed, showing a 1.4 cm cyst containing a small amount of internal echoes and partial thin internal septation in the 11:30 o'clock position of the right breast, 3 cm from the nipple, corresponding to the mammographic mass. A 9 mm simple cyst is  demonstrated in the 2 o'clock position of the left breast, 8 cm from the nipple, corresponding to the mammographic mass. There is also a nearby 6 mm cyst containing a thin partial internal septation. IMPRESSION: Benign bilateral breast cysts.  No evidence of malignancy. RECOMMENDATION: Bilateral screening mammogram in 1 year. I have discussed the findings and recommendations with the patient. If applicable, a reminder letter will be sent to the patient regarding the next appointment. BI-RADS CATEGORY  2: Benign. Electronically Signed   By: Beckie Salts M.D.   On: 05/10/2019 14:37       Assessment & Plan:   Problem List Items Addressed This Visit    Anemia    hgb decreased post surgery.  Recheck cbc and check ferritin and B12.       Relevant Orders   CBC with Differential/Platelet (Completed)   Ferritin (Completed)   Vitamin B12 (Completed)   CKD (chronic kidney disease) stage 3, GFR 30-59 ml/min (HCC)    GFR stable around 50.  Continue to avoid antiinflammatories.  Follow.       Hypercholesterolemia    Declines statin medication.  Low cholesterol diet and exercise. Follow lipid panel and liver function tests.        Hyperglycemia    Low carb diet and exercise.  Follow met b and a1c.       Hypertension - Primary    States blood pressures are doing ok at home off spironolactone.  On benicar.  Recent low sodium.  Continue benicar for now.  Remain off spironolactone until review met b.  Follow blood pressures.  If elevation, will need to adjust medication.        Hyponatremia    Sodium low post surgery.  Recheck today.  Remains off spironolactone.        Relevant Orders   Basic metabolic panel (Completed)   Leukocytosis    Recheck cbc to confirm white blood cell count normalizes.           Einar Pheasant, MD

## 2020-07-16 DIAGNOSIS — Z471 Aftercare following joint replacement surgery: Secondary | ICD-10-CM | POA: Diagnosis not present

## 2020-07-16 DIAGNOSIS — Z96641 Presence of right artificial hip joint: Secondary | ICD-10-CM | POA: Diagnosis not present

## 2020-07-20 ENCOUNTER — Encounter: Payer: Self-pay | Admitting: Internal Medicine

## 2020-07-20 NOTE — Assessment & Plan Note (Signed)
hgb decreased post surgery.  Recheck cbc and check ferritin and B12.

## 2020-07-20 NOTE — Assessment & Plan Note (Signed)
Recheck cbc to confirm white blood cell count normalizes.

## 2020-07-20 NOTE — Assessment & Plan Note (Signed)
Low carb diet and exercise.  Follow met b and a1c.  

## 2020-07-20 NOTE — Assessment & Plan Note (Signed)
Declines statin medication.  Low cholesterol diet and exercise. Follow lipid panel and liver function tests.

## 2020-07-20 NOTE — Assessment & Plan Note (Signed)
States blood pressures are doing ok at home off spironolactone.  On benicar.  Recent low sodium.  Continue benicar for now.  Remain off spironolactone until review met b.  Follow blood pressures.  If elevation, will need to adjust medication.

## 2020-07-20 NOTE — Assessment & Plan Note (Signed)
Sodium low post surgery.  Recheck today.  Remains off spironolactone.

## 2020-07-20 NOTE — Assessment & Plan Note (Signed)
GFR stable around 50.  Continue to avoid antiinflammatories.  Follow.

## 2020-07-21 ENCOUNTER — Telehealth: Payer: Self-pay | Admitting: *Deleted

## 2020-07-21 ENCOUNTER — Other Ambulatory Visit: Payer: Self-pay | Admitting: *Deleted

## 2020-07-21 DIAGNOSIS — E871 Hypo-osmolality and hyponatremia: Secondary | ICD-10-CM

## 2020-07-21 DIAGNOSIS — N1831 Chronic kidney disease, stage 3a: Secondary | ICD-10-CM

## 2020-07-21 NOTE — Telephone Encounter (Signed)
-----  Message from Einar Pheasant, MD sent at 07/17/2020  2:09 PM EDT ----- Per initial note, needs stat met b and cbc, but I did not know which location.

## 2020-07-22 ENCOUNTER — Ambulatory Visit (INDEPENDENT_AMBULATORY_CARE_PROVIDER_SITE_OTHER): Payer: PPO

## 2020-07-22 ENCOUNTER — Other Ambulatory Visit: Payer: Self-pay

## 2020-07-22 DIAGNOSIS — E538 Deficiency of other specified B group vitamins: Secondary | ICD-10-CM

## 2020-07-22 DIAGNOSIS — E871 Hypo-osmolality and hyponatremia: Secondary | ICD-10-CM

## 2020-07-22 DIAGNOSIS — N1831 Chronic kidney disease, stage 3a: Secondary | ICD-10-CM | POA: Diagnosis not present

## 2020-07-22 LAB — BASIC METABOLIC PANEL
BUN: 38 mg/dL — ABNORMAL HIGH (ref 6–23)
CO2: 22 mEq/L (ref 19–32)
Calcium: 9.8 mg/dL (ref 8.4–10.5)
Chloride: 103 mEq/L (ref 96–112)
Creatinine, Ser: 1.47 mg/dL — ABNORMAL HIGH (ref 0.40–1.20)
GFR: 33.18 mL/min — ABNORMAL LOW (ref >60.00)
Glucose, Bld: 115 mg/dL — ABNORMAL HIGH (ref 70–99)
Potassium: 4.7 mEq/L (ref 3.5–5.1)
Sodium: 137 mEq/L (ref 135–145)

## 2020-07-22 MED ORDER — CYANOCOBALAMIN 1000 MCG/ML IJ SOLN
1000.0000 ug | Freq: Once | INTRAMUSCULAR | Status: AC
  Administered 2020-07-22: 1000 ug via INTRAMUSCULAR

## 2020-07-22 NOTE — Progress Notes (Signed)
Patient presented for B 12 injection to left deltoid, patient voiced no concerns nor showed any signs of distress during injection. 

## 2020-07-23 ENCOUNTER — Other Ambulatory Visit: Payer: Self-pay | Admitting: Internal Medicine

## 2020-07-23 DIAGNOSIS — R944 Abnormal results of kidney function studies: Secondary | ICD-10-CM

## 2020-07-23 LAB — CBC WITH DIFFERENTIAL/PLATELET
Basophils Absolute: 0.1 10*3/uL (ref 0.0–0.1)
Basophils Relative: 0.6 % (ref 0.0–3.0)
Eosinophils Absolute: 0.3 10*3/uL (ref 0.0–0.7)
Eosinophils Relative: 2.6 % (ref 0.0–5.0)
HCT: 32.7 % — ABNORMAL LOW (ref 36.0–46.0)
Hemoglobin: 10.9 g/dL — ABNORMAL LOW (ref 12.0–15.0)
Lymphocytes Relative: 25 % (ref 12.0–46.0)
Lymphs Abs: 2.8 10*3/uL (ref 0.7–4.0)
MCHC: 33.4 g/dL (ref 30.0–36.0)
MCV: 89.5 fl (ref 78.0–100.0)
Monocytes Absolute: 0.8 10*3/uL (ref 0.1–1.0)
Monocytes Relative: 7.1 % (ref 3.0–12.0)
Neutro Abs: 7.1 10*3/uL (ref 1.4–7.7)
Neutrophils Relative %: 64.7 % (ref 43.0–77.0)
Platelets: 371 10*3/uL (ref 150.0–400.0)
RBC: 3.65 Mil/uL — ABNORMAL LOW (ref 3.87–5.11)
RDW: 15.7 % — ABNORMAL HIGH (ref 11.5–15.5)
WBC: 11 10*3/uL — ABNORMAL HIGH (ref 4.0–10.5)

## 2020-07-23 NOTE — Progress Notes (Signed)
Order placed for f/u labs.  

## 2020-07-24 ENCOUNTER — Other Ambulatory Visit: Payer: Self-pay | Admitting: Internal Medicine

## 2020-07-24 ENCOUNTER — Other Ambulatory Visit: Payer: Self-pay

## 2020-07-24 DIAGNOSIS — D649 Anemia, unspecified: Secondary | ICD-10-CM

## 2020-07-24 MED ORDER — OMEGA-3 FATTY ACIDS 1000 MG PO CAPS: 1.0000 g | ORAL_CAPSULE | Freq: Every day | ORAL | Status: AC

## 2020-07-24 MED ORDER — TURMERIC 500 MG PO TABS: 500.0000 mg | ORAL_TABLET | Freq: Every day | ORAL | Status: DC

## 2020-07-24 NOTE — Progress Notes (Signed)
Order placed for f/u labs.  

## 2020-07-31 ENCOUNTER — Ambulatory Visit (INDEPENDENT_AMBULATORY_CARE_PROVIDER_SITE_OTHER): Payer: PPO

## 2020-07-31 ENCOUNTER — Other Ambulatory Visit: Payer: Self-pay

## 2020-07-31 DIAGNOSIS — E538 Deficiency of other specified B group vitamins: Secondary | ICD-10-CM

## 2020-07-31 DIAGNOSIS — D649 Anemia, unspecified: Secondary | ICD-10-CM | POA: Diagnosis not present

## 2020-07-31 DIAGNOSIS — R944 Abnormal results of kidney function studies: Secondary | ICD-10-CM | POA: Diagnosis not present

## 2020-07-31 LAB — CBC WITH DIFFERENTIAL/PLATELET
Basophils Absolute: 0.1 10*3/uL (ref 0.0–0.1)
Basophils Relative: 1 % (ref 0.0–3.0)
Eosinophils Absolute: 0.3 10*3/uL (ref 0.0–0.7)
Eosinophils Relative: 2.5 % (ref 0.0–5.0)
HCT: 33.7 % — ABNORMAL LOW (ref 36.0–46.0)
Hemoglobin: 11.2 g/dL — ABNORMAL LOW (ref 12.0–15.0)
Lymphocytes Relative: 24 % (ref 12.0–46.0)
Lymphs Abs: 2.8 10*3/uL (ref 0.7–4.0)
MCHC: 33.2 g/dL (ref 30.0–36.0)
MCV: 90.3 fl (ref 78.0–100.0)
Monocytes Absolute: 0.8 10*3/uL (ref 0.1–1.0)
Monocytes Relative: 6.9 % (ref 3.0–12.0)
Neutro Abs: 7.7 10*3/uL (ref 1.4–7.7)
Neutrophils Relative %: 65.6 % (ref 43.0–77.0)
Platelets: 297 10*3/uL (ref 150.0–400.0)
RBC: 3.73 Mil/uL — ABNORMAL LOW (ref 3.87–5.11)
RDW: 15.7 % — ABNORMAL HIGH (ref 11.5–15.5)
WBC: 11.7 10*3/uL — ABNORMAL HIGH (ref 4.0–10.5)

## 2020-07-31 LAB — URINALYSIS, ROUTINE W REFLEX MICROSCOPIC
Bilirubin Urine: NEGATIVE
Hgb urine dipstick: NEGATIVE
Ketones, ur: NEGATIVE
Nitrite: NEGATIVE
Specific Gravity, Urine: 1.02 (ref 1.000–1.030)
Total Protein, Urine: NEGATIVE
Urine Glucose: NEGATIVE
Urobilinogen, UA: 0.2 (ref 0.0–1.0)
pH: 5.5 (ref 5.0–8.0)

## 2020-07-31 LAB — IBC + FERRITIN
Ferritin: 140.4 ng/mL (ref 10.0–291.0)
Iron: 47 ug/dL (ref 42–145)
Saturation Ratios: 13.9 % — ABNORMAL LOW (ref 20.0–50.0)
Transferrin: 242 mg/dL (ref 212.0–360.0)

## 2020-07-31 LAB — BASIC METABOLIC PANEL
BUN: 25 mg/dL — ABNORMAL HIGH (ref 6–23)
CO2: 24 mEq/L (ref 19–32)
Calcium: 9.6 mg/dL (ref 8.4–10.5)
Chloride: 103 mEq/L (ref 96–112)
Creatinine, Ser: 1.1 mg/dL (ref 0.40–1.20)
GFR: 46.98 mL/min — ABNORMAL LOW (ref >60.00)
Glucose, Bld: 79 mg/dL (ref 70–99)
Potassium: 5 mEq/L (ref 3.5–5.1)
Sodium: 137 mEq/L (ref 135–145)

## 2020-07-31 MED ORDER — CYANOCOBALAMIN 1000 MCG/ML IJ SOLN
1000.0000 ug | Freq: Once | INTRAMUSCULAR | Status: AC
  Administered 2020-07-31: 1000 ug via INTRAMUSCULAR

## 2020-07-31 NOTE — Progress Notes (Signed)
Pt came in for b12 injection. Tolerated well. No complaints nor concerns at this time.

## 2020-08-04 ENCOUNTER — Other Ambulatory Visit: Payer: Self-pay | Admitting: Internal Medicine

## 2020-08-04 DIAGNOSIS — E875 Hyperkalemia: Secondary | ICD-10-CM

## 2020-08-04 DIAGNOSIS — D72829 Elevated white blood cell count, unspecified: Secondary | ICD-10-CM

## 2020-08-04 NOTE — Progress Notes (Signed)
Order placed for f/u labs.  

## 2020-08-07 ENCOUNTER — Ambulatory Visit (INDEPENDENT_AMBULATORY_CARE_PROVIDER_SITE_OTHER): Payer: PPO

## 2020-08-07 ENCOUNTER — Other Ambulatory Visit: Payer: Self-pay

## 2020-08-07 DIAGNOSIS — E538 Deficiency of other specified B group vitamins: Secondary | ICD-10-CM | POA: Diagnosis not present

## 2020-08-07 MED ORDER — CYANOCOBALAMIN 1000 MCG/ML IJ SOLN
1000.0000 ug | Freq: Once | INTRAMUSCULAR | Status: AC
  Administered 2020-08-07: 1000 ug via INTRAMUSCULAR

## 2020-08-07 NOTE — Progress Notes (Signed)
Patient presented for B 12 injection to left deltoid, patient voiced no concerns nor showed any signs of distress during injection. 

## 2020-08-09 ENCOUNTER — Other Ambulatory Visit: Payer: Self-pay | Admitting: Internal Medicine

## 2020-08-14 ENCOUNTER — Other Ambulatory Visit: Payer: Self-pay

## 2020-08-14 ENCOUNTER — Ambulatory Visit (INDEPENDENT_AMBULATORY_CARE_PROVIDER_SITE_OTHER): Payer: PPO

## 2020-08-14 DIAGNOSIS — E538 Deficiency of other specified B group vitamins: Secondary | ICD-10-CM

## 2020-08-14 MED ORDER — CYANOCOBALAMIN 1000 MCG/ML IJ SOLN
1000.0000 ug | Freq: Once | INTRAMUSCULAR | Status: AC
Start: 2020-08-14 — End: 2020-08-14
  Administered 2020-08-14: 1000 ug via INTRAMUSCULAR

## 2020-08-14 NOTE — Progress Notes (Signed)
Patient came in today for B-12 injection in right deltoid IM. Patient tolerated well with no signs of distress.

## 2020-08-27 ENCOUNTER — Other Ambulatory Visit (INDEPENDENT_AMBULATORY_CARE_PROVIDER_SITE_OTHER): Payer: PPO

## 2020-08-27 ENCOUNTER — Other Ambulatory Visit: Payer: Self-pay

## 2020-08-27 DIAGNOSIS — E875 Hyperkalemia: Secondary | ICD-10-CM | POA: Diagnosis not present

## 2020-08-27 DIAGNOSIS — D72829 Elevated white blood cell count, unspecified: Secondary | ICD-10-CM | POA: Diagnosis not present

## 2020-08-27 LAB — CBC WITH DIFFERENTIAL/PLATELET
Basophils Absolute: 0.1 10*3/uL (ref 0.0–0.1)
Basophils Relative: 1.3 % (ref 0.0–3.0)
Eosinophils Absolute: 0.3 10*3/uL (ref 0.0–0.7)
Eosinophils Relative: 2.5 % (ref 0.0–5.0)
HCT: 35.2 % — ABNORMAL LOW (ref 36.0–46.0)
Hemoglobin: 11.6 g/dL — ABNORMAL LOW (ref 12.0–15.0)
Lymphocytes Relative: 23.5 % (ref 12.0–46.0)
Lymphs Abs: 2.4 10*3/uL (ref 0.7–4.0)
MCHC: 33 g/dL (ref 30.0–36.0)
MCV: 85.8 fl (ref 78.0–100.0)
Monocytes Absolute: 0.5 10*3/uL (ref 0.1–1.0)
Monocytes Relative: 4.5 % (ref 3.0–12.0)
Neutro Abs: 6.8 10*3/uL (ref 1.4–7.7)
Neutrophils Relative %: 68.2 % (ref 43.0–77.0)
Platelets: 269 10*3/uL (ref 150.0–400.0)
RBC: 4.1 Mil/uL (ref 3.87–5.11)
RDW: 15.7 % — ABNORMAL HIGH (ref 11.5–15.5)
WBC: 10 10*3/uL (ref 4.0–10.5)

## 2020-08-27 LAB — POTASSIUM: Potassium: 4.5 mEq/L (ref 3.5–5.1)

## 2020-09-02 DIAGNOSIS — Z471 Aftercare following joint replacement surgery: Secondary | ICD-10-CM | POA: Diagnosis not present

## 2020-09-02 DIAGNOSIS — Z96641 Presence of right artificial hip joint: Secondary | ICD-10-CM | POA: Diagnosis not present

## 2020-09-03 ENCOUNTER — Other Ambulatory Visit: Payer: Self-pay | Admitting: Internal Medicine

## 2020-09-15 ENCOUNTER — Encounter: Payer: Self-pay | Admitting: Internal Medicine

## 2020-09-15 ENCOUNTER — Ambulatory Visit (INDEPENDENT_AMBULATORY_CARE_PROVIDER_SITE_OTHER): Payer: PPO | Admitting: Internal Medicine

## 2020-09-15 ENCOUNTER — Other Ambulatory Visit: Payer: Self-pay

## 2020-09-15 VITALS — BP 132/74 | HR 88 | Temp 98.1°F | Resp 16 | Ht 63.0 in | Wt 176.0 lb

## 2020-09-15 DIAGNOSIS — D649 Anemia, unspecified: Secondary | ICD-10-CM | POA: Diagnosis not present

## 2020-09-15 DIAGNOSIS — E7800 Pure hypercholesterolemia, unspecified: Secondary | ICD-10-CM

## 2020-09-15 DIAGNOSIS — N1831 Chronic kidney disease, stage 3a: Secondary | ICD-10-CM | POA: Diagnosis not present

## 2020-09-15 DIAGNOSIS — E871 Hypo-osmolality and hyponatremia: Secondary | ICD-10-CM | POA: Diagnosis not present

## 2020-09-15 DIAGNOSIS — E538 Deficiency of other specified B group vitamins: Secondary | ICD-10-CM

## 2020-09-15 DIAGNOSIS — I10 Essential (primary) hypertension: Secondary | ICD-10-CM

## 2020-09-15 DIAGNOSIS — R739 Hyperglycemia, unspecified: Secondary | ICD-10-CM | POA: Diagnosis not present

## 2020-09-15 MED ORDER — CYANOCOBALAMIN 1000 MCG/ML IJ SOLN
1000.0000 ug | Freq: Once | INTRAMUSCULAR | Status: AC
  Administered 2020-09-15: 1000 ug via INTRAMUSCULAR

## 2020-09-15 NOTE — Progress Notes (Signed)
Patient ID: Cathy Vazquez, female   DOB: 1940-04-09, 80 y.o.   MRN: [id number]   Subjective:    Patient ID: Cathy Vazquez, female    DOB: 06-07-1940, 80 y.o.   MRN: [id number]  HPI This visit occurred during the SARS-CoV-2 public health emergency.  Safety protocols were in place, including screening questions prior to the visit, additional usage of staff PPE, and extensive cleaning of exam room while observing appropriate contact time as indicated for disinfecting solutions.   Patient here for a scheduled follow up.  Here to follow up regarding her blood pressure.  Just had labs checked.  Potassium wnl.  Hgb improved.  S/p total right hip replacement.  Doing well.  Uses a crutch to get around.  No chest pain or sob reported.  No abdominal pain.  Bowels moving.  Wants to postpone mammogram for a while.  Will notify me when ready.     Past Medical History:  Diagnosis Date   Anemia    Diverticulosis    GERD (gastroesophageal reflux disease)    Glaucoma    Hypercholesterolemia    Hypertension    Thyroid nodule    Past Surgical History:  Procedure Laterality Date   ABDOMINAL HYSTERECTOMY  1987   fibroids, ovaries not removed   TONSILLECTOMY     Family History  Problem Relation Age of Onset   Diabetes Father    Hypertension Father    Hypercholesterolemia Father    Heart disease Father        CABG   Hypertension Mother    Hypercholesterolemia Mother    Rheumatic fever Mother    Hypertension Brother    Diabetes Sister    Breast cancer Maternal Aunt    Breast cancer Cousin    Colon cancer Neg Hx    Social History   Socioeconomic History   Marital status: Divorced    Spouse name: Not on file   Number of children: 2   Years of education: Not on file   Highest education level: Not on file  Occupational History   Not on file  Tobacco Use   Smoking status: Never   Smokeless tobacco: Never  Vaping Use   Vaping Use: Never used  Substance and Sexual Activity    Alcohol use: No    Alcohol/week: 0.0 standard drinks   Drug use: No   Sexual activity: Not Currently  Other Topics Concern   Not on file  Social History Narrative   Not on file   Social Determinants of Health   Financial Resource Strain: Low Risk    Difficulty of Paying Living Expenses: Not hard at all  Food Insecurity: No Food Insecurity   Worried About Charity fundraiser in the Last Year: Never true   [place] in the Last Year: Never true  Transportation Needs: No Transportation Needs   Lack of Transportation (Medical): No   Lack of Transportation (Non-Medical): No  Physical Activity: Unknown   Days of Exercise per Week: 0 days   Minutes of Exercise per Session: Not on file  Stress: No Stress Concern Present   Feeling of Stress : Not at all  Social Connections: Unknown   Frequency of Communication with Friends and Family: More than three times a week   Frequency of Social Gatherings with Friends and Family: More than three times a week   Attends Religious Services: Not on file   Active Member of Clubs or Organizations: Not on file  Attends Archivist Meetings: Not on file   Marital Status: Not on file    Review of Systems  Constitutional:  Negative for appetite change and unexpected weight change.  HENT:  Negative for congestion and sinus pressure.   Respiratory:  Negative for cough, chest tightness and shortness of breath.   Cardiovascular:  Negative for chest pain, palpitations and leg swelling.  Gastrointestinal:  Negative for abdominal pain, diarrhea, nausea and vomiting.  Genitourinary:  Negative for difficulty urinating and dysuria.  Musculoskeletal:  Negative for joint swelling and myalgias.  Skin:  Negative for color change and rash.  Neurological:  Negative for dizziness, light-headedness and headaches.  Psychiatric/Behavioral:  Negative for agitation and dysphoric mood.       Objective:    Physical Exam Vitals reviewed.  Constitutional:       General: She is not in acute distress.    Appearance: Normal appearance.  HENT:     Head: Normocephalic and atraumatic.     Right Ear: External ear normal.     Left Ear: External ear normal.  Eyes:     General: No scleral icterus.       Right eye: No discharge.        Left eye: No discharge.     Conjunctiva/sclera: Conjunctivae normal.  Neck:     Thyroid: No thyromegaly.  Cardiovascular:     Rate and Rhythm: Normal rate and regular rhythm.  Pulmonary:     Effort: No respiratory distress.     Breath sounds: Normal breath sounds. No wheezing.  Abdominal:     General: Bowel sounds are normal.     Palpations: Abdomen is soft.     Tenderness: There is no abdominal tenderness.  Musculoskeletal:        General: No swelling or tenderness.     Cervical back: Neck supple. No tenderness.  Lymphadenopathy:     Cervical: No cervical adenopathy.  Skin:    Findings: No erythema or rash.  Neurological:     Mental Status: She is alert.  Psychiatric:        Mood and Affect: Mood normal.        Behavior: Behavior normal.    BP 132/74   Pulse 88   Temp 98.1 F (36.7 C)   Resp 16   Ht _0  (1.6 m)   Wt 176 lb (79.8 kg)   LMP 02/21/1984   SpO2 98%   BMI 31.18 kg/m  Wt Readings from Last 3 Encounters:  09/15/20 176 lb (79.8 kg)  07/14/20 176 lb (79.8 kg)  05/11/20 172 lb (78 kg)    Outpatient Encounter Medications as of 09/15/2020  Medication Sig   acetaminophen (TYLENOL) 325 MG tablet Take 650 mg by mouth every 4 (four) hours as needed.   Alpha-Lipoic Acid 200 MG CAPS Take 200 mg by mouth daily.   Bioflavonoid Products (BIOFLEX PO) Take 1 tablet by mouth daily.    fish oil-omega-3 fatty acids 1000 MG capsule Take 1 capsule (1 g total) by mouth daily.   Multiple Vitamin (MULTIVITAMIN) tablet Take 1 tablet by mouth daily.   [DISCONTINUED] olmesartan (BENICAR) 20 MG tablet TAKE 1 TABLET BY MOUTH EVERY DAY   [EXPIRED] cyanocobalamin ((VITAMIN B-12)) injection 1,000 mcg     No facility-administered encounter medications on file as of 09/15/2020.     Lab Results  Component Value Date   WBC 10.0 08/27/2020   HGB 11.6 (L) 08/27/2020   HCT 35.2 (L) 08/27/2020   PLT  269.0 08/27/2020   GLUCOSE 79 07/31/2020   CHOL 222 (H) 02/10/2020   TRIG 253.0 (H) 02/10/2020   HDL 38.80 (L) 02/10/2020   LDLDIRECT 150.0 02/10/2020   LDLCALC 165 (H) 01/25/2019   ALT 14 02/10/2020   AST 12 02/10/2020   NA 137 07/31/2020   K 4.5 08/27/2020   CL 103 07/31/2020   CREATININE 1.10 07/31/2020   BUN 25 (H) 07/31/2020   CO2 24 07/31/2020   TSH 2.48 06/07/2019   HGBA1C 6.1 02/10/2020    US BREAST LTD UNI LEFT INC AXILLA  Result Date: 05/10/2019 CLINICAL DATA:  Possible mass in the 12 o'clock position of the right breast posteriorly and possible mass in the upper outer left breast posteriorly on a recent screening mammogram. EXAM: DIGITAL DIAGNOSTIC BILATERAL MAMMOGRAM WITH TOMO ULTRASOUND BILATERAL BREAST COMPARISON:  Previous exam(s). ACR Breast Density Category c: The breast tissue is heterogeneously dense, which may obscure small masses. FINDINGS: 3D tomographic and 2D generated spot compression images of both breasts demonstrate an approximately 1 cm oval, partially circumscribed and partially obscured mass in the superior aspect of right breast in the oblique projection, not seen in the craniocaudal projection. There is also an approximately 1 cm oval, partially circumscribed and partially obscured mass in the posterior aspect of the upper-outer left breast in approximately the 2 to 3 o'clock position. Targeted ultrasound is performed, showing a 1.4 cm cyst containing a small amount of internal echoes and partial thin internal septation in the 11:30 o'clock position of the right breast, 3 cm from the nipple, corresponding to the mammographic mass. A 9 mm simple cyst is demonstrated in the 2 o'clock position of the left breast, 8 cm from the nipple, corresponding to the mammographic  mass. There is also a nearby 6 mm cyst containing a thin partial internal septation. IMPRESSION: Benign bilateral breast cysts.  No evidence of malignancy. RECOMMENDATION: Bilateral screening mammogram in 1 year. I have discussed the findings and recommendations with the patient. If applicable, a reminder letter will be sent to the patient regarding the next appointment. BI-RADS CATEGORY  2: Benign. Electronically Signed   By: Claudie Revering M.D.   On: 05/10/2019 14:37   US BREAST LTD UNI RIGHT INC AXILLA  Result Date: 05/10/2019 CLINICAL DATA:  Possible mass in the 12 o'clock position of the right breast posteriorly and possible mass in the upper outer left breast posteriorly on a recent screening mammogram. EXAM: DIGITAL DIAGNOSTIC BILATERAL MAMMOGRAM WITH TOMO ULTRASOUND BILATERAL BREAST COMPARISON:  Previous exam(s). ACR Breast Density Category c: The breast tissue is heterogeneously dense, which may obscure small masses. FINDINGS: 3D tomographic and 2D generated spot compression images of both breasts demonstrate an approximately 1 cm oval, partially circumscribed and partially obscured mass in the superior aspect of right breast in the oblique projection, not seen in the craniocaudal projection. There is also an approximately 1 cm oval, partially circumscribed and partially obscured mass in the posterior aspect of the upper-outer left breast in approximately the 2 to 3 o'clock position. Targeted ultrasound is performed, showing a 1.4 cm cyst containing a small amount of internal echoes and partial thin internal septation in the 11:30 o'clock position of the right breast, 3 cm from the nipple, corresponding to the mammographic mass. A 9 mm simple cyst is demonstrated in the 2 o'clock position of the left breast, 8 cm from the nipple, corresponding to the mammographic mass. There is also a nearby 6 mm cyst containing a thin partial internal septation.  IMPRESSION: Benign bilateral breast cysts.  No evidence of  malignancy. RECOMMENDATION: Bilateral screening mammogram in 1 year. I have discussed the findings and recommendations with the patient. If applicable, a reminder letter will be sent to the patient regarding the next appointment. BI-RADS CATEGORY  2: Benign. Electronically Signed   By: Claudie Revering M.D.   On: 05/10/2019 14:37   MM DIAG BREAST TOMO BILATERAL  Result Date: 05/10/2019 CLINICAL DATA:  Possible mass in the 12 o'clock position of the right breast posteriorly and possible mass in the upper outer left breast posteriorly on a recent screening mammogram. EXAM: DIGITAL DIAGNOSTIC BILATERAL MAMMOGRAM WITH TOMO ULTRASOUND BILATERAL BREAST COMPARISON:  Previous exam(s). ACR Breast Density Category c: The breast tissue is heterogeneously dense, which may obscure small masses. FINDINGS: 3D tomographic and 2D generated spot compression images of both breasts demonstrate an approximately 1 cm oval, partially circumscribed and partially obscured mass in the superior aspect of right breast in the oblique projection, not seen in the craniocaudal projection. There is also an approximately 1 cm oval, partially circumscribed and partially obscured mass in the posterior aspect of the upper-outer left breast in approximately the 2 to 3 o'clock position. Targeted ultrasound is performed, showing a 1.4 cm cyst containing a small amount of internal echoes and partial thin internal septation in the 11:30 o'clock position of the right breast, 3 cm from the nipple, corresponding to the mammographic mass. A 9 mm simple cyst is demonstrated in the 2 o'clock position of the left breast, 8 cm from the nipple, corresponding to the mammographic mass. There is also a nearby 6 mm cyst containing a thin partial internal septation. IMPRESSION: Benign bilateral breast cysts.  No evidence of malignancy. RECOMMENDATION: Bilateral screening mammogram in 1 year. I have discussed the findings and recommendations with the patient. If  applicable, a reminder letter will be sent to the patient regarding the next appointment. BI-RADS CATEGORY  2: Benign. Electronically Signed   By: Claudie Revering M.D.   On: 05/10/2019 14:37       Assessment & Plan:   Problem List Items Addressed This Visit     Anemia    hgb decreased post surgery.  Recent check - improved - 11.6.  Follow.         Relevant Orders   CBC with Differential/Platelet   IBC + Ferritin   CKD (chronic kidney disease) stage 3, GFR 30-59 ml/min (HCC)    GFR 07/31/20 - 46.  Avoid antiinflammatories.  Follow metabolic panel.        Hypercholesterolemia    Declines statin medication.  Low cholesterol diet and exercise. Follow lipid panel and liver function tests.         Relevant Orders   Hepatic function panel   Lipid panel   Hyperglycemia    Low carb diet and exercise.  Follow met b and a1c.        Relevant Orders   Hemoglobin A1c   Hypertension    On benicar.  Blood pressures doing well.  Follow metabolic panel.  Follow pressures.         Relevant Orders   TSH   Basic metabolic panel   Hyponatremia    07/31/20 - sodium wnl.  Follow.        Other Visit Diagnoses     B12 deficiency    -  Primary   Relevant Medications   cyanocobalamin ((VITAMIN B-12)) injection 1,000 mcg (Completed)        Jasiel Belisle  Nicki Reaper, MD

## 2020-09-17 ENCOUNTER — Other Ambulatory Visit: Payer: Self-pay | Admitting: Internal Medicine

## 2020-09-18 ENCOUNTER — Ambulatory Visit: Payer: PPO

## 2020-09-20 ENCOUNTER — Encounter: Payer: Self-pay | Admitting: Internal Medicine

## 2020-09-20 NOTE — Assessment & Plan Note (Signed)
GFR 07/31/20 - 46.  Avoid antiinflammatories.  Follow metabolic panel.

## 2020-09-20 NOTE — Assessment & Plan Note (Signed)
On benicar.  Blood pressures doing well.  Follow metabolic panel.  Follow pressures.

## 2020-09-20 NOTE — Assessment & Plan Note (Signed)
hgb decreased post surgery.  Recent check - improved - 11.6.  Follow.

## 2020-09-20 NOTE — Assessment & Plan Note (Signed)
07/31/20 - sodium wnl.  Follow.

## 2020-09-20 NOTE — Assessment & Plan Note (Signed)
Declines statin medication.  Low cholesterol diet and exercise. Follow lipid panel and liver function tests.

## 2020-09-20 NOTE — Assessment & Plan Note (Signed)
Low carb diet and exercise.  Follow met b and a1c.  

## 2020-10-19 ENCOUNTER — Ambulatory Visit (INDEPENDENT_AMBULATORY_CARE_PROVIDER_SITE_OTHER): Payer: PPO

## 2020-10-19 ENCOUNTER — Other Ambulatory Visit: Payer: Self-pay

## 2020-10-19 DIAGNOSIS — E538 Deficiency of other specified B group vitamins: Secondary | ICD-10-CM | POA: Diagnosis not present

## 2020-10-19 MED ORDER — CYANOCOBALAMIN 1000 MCG/ML IJ SOLN
1000.0000 ug | Freq: Once | INTRAMUSCULAR | Status: AC
  Administered 2020-10-19: 1000 ug via INTRAMUSCULAR

## 2020-10-19 NOTE — Progress Notes (Addendum)
Patient presented for B 12 injection to left deltoid, patient voiced no concerns nor showed any signs of distress during injection. 

## 2020-11-19 ENCOUNTER — Other Ambulatory Visit: Payer: Self-pay

## 2020-11-19 ENCOUNTER — Ambulatory Visit (INDEPENDENT_AMBULATORY_CARE_PROVIDER_SITE_OTHER): Payer: PPO

## 2020-11-19 DIAGNOSIS — E538 Deficiency of other specified B group vitamins: Secondary | ICD-10-CM

## 2020-11-19 MED ORDER — CYANOCOBALAMIN 1000 MCG/ML IJ SOLN
1000.0000 ug | Freq: Once | INTRAMUSCULAR | Status: AC
  Administered 2020-11-19: 1000 ug via INTRAMUSCULAR

## 2020-11-19 NOTE — Progress Notes (Signed)
Patient came in today for B-12 injection in right deltoid IM. Patient tolerated well with no signs of distress.

## 2020-12-14 ENCOUNTER — Telehealth: Payer: Self-pay | Admitting: Internal Medicine

## 2020-12-14 NOTE — Telephone Encounter (Signed)
Ok to complete letter for her?

## 2020-12-14 NOTE — Telephone Encounter (Signed)
Patient needs a note written to [employer] to let them know patient can not get her trash cans to the street and would like some help. She would like 2 copies of the letter and mailed to patient.

## 2020-12-15 NOTE — Telephone Encounter (Signed)
Yes.  Ok.  Pt with recent hip surgery.  (Just needs basic letter - "needs - due to underlying medical issues").

## 2020-12-16 ENCOUNTER — Other Ambulatory Visit: Payer: PPO

## 2020-12-17 NOTE — Telephone Encounter (Signed)
Letters placed up front. Patient will pick up 10/31 at her appt.

## 2020-12-17 NOTE — Telephone Encounter (Signed)
Letters printed for patient.

## 2020-12-18 ENCOUNTER — Encounter: Payer: PPO | Admitting: Internal Medicine

## 2020-12-21 ENCOUNTER — Other Ambulatory Visit: Payer: Self-pay

## 2020-12-21 ENCOUNTER — Ambulatory Visit (INDEPENDENT_AMBULATORY_CARE_PROVIDER_SITE_OTHER): Payer: PPO

## 2020-12-21 DIAGNOSIS — E538 Deficiency of other specified B group vitamins: Secondary | ICD-10-CM

## 2020-12-21 MED ORDER — CYANOCOBALAMIN 1000 MCG/ML IJ SOLN
1000.0000 ug | Freq: Once | INTRAMUSCULAR | Status: AC
  Administered 2020-12-21: 1000 ug via INTRAMUSCULAR

## 2020-12-21 NOTE — Progress Notes (Signed)
Patient presented for B 12 injection to left deltoid, patient voiced no concerns nor showed any signs of distress during injection. 

## 2021-01-08 ENCOUNTER — Other Ambulatory Visit: Payer: Self-pay

## 2021-01-08 ENCOUNTER — Other Ambulatory Visit (INDEPENDENT_AMBULATORY_CARE_PROVIDER_SITE_OTHER): Payer: PPO

## 2021-01-08 DIAGNOSIS — E7800 Pure hypercholesterolemia, unspecified: Secondary | ICD-10-CM

## 2021-01-08 DIAGNOSIS — I10 Essential (primary) hypertension: Secondary | ICD-10-CM | POA: Diagnosis not present

## 2021-01-08 DIAGNOSIS — R739 Hyperglycemia, unspecified: Secondary | ICD-10-CM

## 2021-01-08 DIAGNOSIS — D649 Anemia, unspecified: Secondary | ICD-10-CM

## 2021-01-08 LAB — CBC WITH DIFFERENTIAL/PLATELET
Basophils Absolute: 0.1 10*3/uL (ref 0.0–0.1)
Basophils Relative: 0.8 % (ref 0.0–3.0)
Eosinophils Absolute: 0.2 10*3/uL (ref 0.0–0.7)
Eosinophils Relative: 1.9 % (ref 0.0–5.0)
HCT: 37.2 % (ref 36.0–46.0)
Hemoglobin: 12.4 g/dL (ref 12.0–15.0)
Lymphocytes Relative: 24.7 % (ref 12.0–46.0)
Lymphs Abs: 2.5 10*3/uL (ref 0.7–4.0)
MCHC: 33.4 g/dL (ref 30.0–36.0)
MCV: 92.3 fl (ref 78.0–100.0)
Monocytes Absolute: 0.6 10*3/uL (ref 0.1–1.0)
Monocytes Relative: 6 % (ref 3.0–12.0)
Neutro Abs: 6.6 10*3/uL (ref 1.4–7.7)
Neutrophils Relative %: 66.6 % (ref 43.0–77.0)
Platelets: 264 10*3/uL (ref 150.0–400.0)
RBC: 4.03 Mil/uL (ref 3.87–5.11)
RDW: 16.7 % — ABNORMAL HIGH (ref 11.5–15.5)
WBC: 9.9 10*3/uL (ref 4.0–10.5)

## 2021-01-08 LAB — BASIC METABOLIC PANEL
BUN: 19 mg/dL (ref 6–23)
CO2: 29 mEq/L (ref 19–32)
Calcium: 9.2 mg/dL (ref 8.4–10.5)
Chloride: 102 mEq/L (ref 96–112)
Creatinine, Ser: 0.98 mg/dL (ref 0.40–1.20)
GFR: 53.79 mL/min — ABNORMAL LOW (ref >60.00)
Glucose, Bld: 123 mg/dL — ABNORMAL HIGH (ref 70–99)
Potassium: 4.5 mEq/L (ref 3.5–5.1)
Sodium: 139 mEq/L (ref 135–145)

## 2021-01-08 LAB — HEMOGLOBIN A1C: Hgb A1c MFr Bld: 6.7 % — ABNORMAL HIGH (ref 4.6–6.5)

## 2021-01-08 LAB — HEPATIC FUNCTION PANEL
ALT: 17 U/L (ref 0–35)
AST: 16 U/L (ref 0–37)
Albumin: 4.3 g/dL (ref 3.5–5.2)
Alkaline Phosphatase: 82 U/L (ref 39–117)
Bilirubin, Direct: 0.1 mg/dL (ref 0.0–0.3)
Total Bilirubin: 0.5 mg/dL (ref 0.2–1.2)
Total Protein: 7 g/dL (ref 6.0–8.3)

## 2021-01-08 LAB — LIPID PANEL
Cholesterol: 217 mg/dL — ABNORMAL HIGH (ref 0–200)
HDL: 41.8 mg/dL (ref >39.00)
NonHDL: 174.81
Total CHOL/HDL Ratio: 5
Triglycerides: 231 mg/dL — ABNORMAL HIGH (ref 0.0–149.0)
VLDL: 46.2 mg/dL — ABNORMAL HIGH (ref 0.0–40.0)

## 2021-01-08 LAB — IBC + FERRITIN
Ferritin: 72.9 ng/mL (ref 10.0–291.0)
Iron: 65 ug/dL (ref 42–145)
Saturation Ratios: 21 % (ref 20.0–50.0)
TIBC: 309.4 ug/dL (ref 250.0–450.0)
Transferrin: 221 mg/dL (ref 212.0–360.0)

## 2021-01-08 LAB — TSH: TSH: 2.71 u[IU]/mL (ref 0.35–5.50)

## 2021-01-08 LAB — LDL CHOLESTEROL, DIRECT: Direct LDL: 144 mg/dL

## 2021-01-12 ENCOUNTER — Encounter: Payer: PPO | Admitting: Internal Medicine

## 2021-01-21 ENCOUNTER — Ambulatory Visit: Payer: PPO

## 2021-01-25 ENCOUNTER — Ambulatory Visit (INDEPENDENT_AMBULATORY_CARE_PROVIDER_SITE_OTHER): Payer: PPO | Admitting: *Deleted

## 2021-01-25 ENCOUNTER — Ambulatory Visit: Payer: PPO

## 2021-01-25 ENCOUNTER — Other Ambulatory Visit: Payer: Self-pay

## 2021-01-25 DIAGNOSIS — E538 Deficiency of other specified B group vitamins: Secondary | ICD-10-CM | POA: Diagnosis not present

## 2021-01-25 MED ORDER — CYANOCOBALAMIN 1000 MCG/ML IJ SOLN
1000.0000 ug | Freq: Once | INTRAMUSCULAR | Status: AC
  Administered 2021-01-25: 1000 ug via INTRAMUSCULAR

## 2021-01-25 NOTE — Progress Notes (Signed)
Patient presented for B 12 injection to right deltoid, patient voiced no concerns nor showed any signs of distress during injection. 

## 2021-02-25 ENCOUNTER — Ambulatory Visit (INDEPENDENT_AMBULATORY_CARE_PROVIDER_SITE_OTHER): Payer: PPO

## 2021-02-25 ENCOUNTER — Other Ambulatory Visit: Payer: Self-pay

## 2021-02-25 DIAGNOSIS — E538 Deficiency of other specified B group vitamins: Secondary | ICD-10-CM | POA: Diagnosis not present

## 2021-02-25 NOTE — Progress Notes (Signed)
Cathy Vazquez presents today for injection per MD orders. B12 injection administered IM in left Upper Arm. Administration without incident. Patient tolerated well. Owain Eckerman,cma

## 2021-02-26 NOTE — Addendum Note (Signed)
Addended by: Fulton Mole D on: 02/26/2021 10:00 AM   Modules accepted: Orders

## 2021-03-10 MED ORDER — CYANOCOBALAMIN 1000 MCG/ML IJ SOLN
1000.0000 ug | Freq: Once | INTRAMUSCULAR | Status: AC
  Administered 2021-02-25: 1000 ug via INTRAMUSCULAR

## 2021-03-10 NOTE — Addendum Note (Signed)
Addended by: Fulton Mole D on: 03/10/2021 04:32 PM   Modules accepted: Orders

## 2021-03-11 DIAGNOSIS — M48061 Spinal stenosis, lumbar region without neurogenic claudication: Secondary | ICD-10-CM | POA: Diagnosis not present

## 2021-03-11 DIAGNOSIS — M25561 Pain in right knee: Secondary | ICD-10-CM | POA: Diagnosis not present

## 2021-03-11 DIAGNOSIS — G8929 Other chronic pain: Secondary | ICD-10-CM | POA: Diagnosis not present

## 2021-03-11 DIAGNOSIS — Z09 Encounter for follow-up examination after completed treatment for conditions other than malignant neoplasm: Secondary | ICD-10-CM | POA: Diagnosis not present

## 2021-03-11 DIAGNOSIS — M170 Bilateral primary osteoarthritis of knee: Secondary | ICD-10-CM | POA: Diagnosis not present

## 2021-03-11 DIAGNOSIS — Z96641 Presence of right artificial hip joint: Secondary | ICD-10-CM | POA: Diagnosis not present

## 2021-03-11 DIAGNOSIS — M25562 Pain in left knee: Secondary | ICD-10-CM | POA: Diagnosis not present

## 2021-03-16 ENCOUNTER — Telehealth: Payer: Self-pay | Admitting: Internal Medicine

## 2021-03-16 DIAGNOSIS — Z96641 Presence of right artificial hip joint: Secondary | ICD-10-CM

## 2021-03-16 NOTE — Telephone Encounter (Signed)
Pt called in requesting for referral to orthopedic. Orthopedist name is [facility]. Pt didn't provide the reason why. Pt stated that she would like to speak with you or Dr. Nicki Reaper about the situation. Pt requesting callback.

## 2021-03-17 NOTE — Telephone Encounter (Signed)
Called patient. Unable to leave message.

## 2021-03-18 NOTE — Telephone Encounter (Signed)
yes

## 2021-03-18 NOTE — Telephone Encounter (Signed)
Patient needs referral to Dr Burnis Kingfisher (orthopedist who did her surgery). She has a f/u appt on 5/3 and has was told referral ws required in order for them to continue to follow her. Ok to place referral?

## 2021-03-23 NOTE — Addendum Note (Signed)
Addended by: Lars Masson on: 03/23/2021 10:00 AM   Modules accepted: Orders

## 2021-03-23 NOTE — Telephone Encounter (Signed)
Referral placed to Dr Georgina Peer.

## 2021-03-27 ENCOUNTER — Other Ambulatory Visit: Payer: Self-pay | Admitting: Internal Medicine

## 2021-03-29 ENCOUNTER — Other Ambulatory Visit: Payer: Self-pay

## 2021-03-29 ENCOUNTER — Ambulatory Visit (INDEPENDENT_AMBULATORY_CARE_PROVIDER_SITE_OTHER): Payer: PPO | Admitting: *Deleted

## 2021-03-29 DIAGNOSIS — E538 Deficiency of other specified B group vitamins: Secondary | ICD-10-CM

## 2021-03-29 MED ORDER — CYANOCOBALAMIN 1000 MCG/ML IJ SOLN
1000.0000 ug | INTRAMUSCULAR | Status: DC
  Administered 2021-03-29: 1000 ug via INTRAMUSCULAR

## 2021-03-29 NOTE — Progress Notes (Signed)
Pt received a B12 injection IM in right deltoid. Tolerated it well.

## 2021-04-12 ENCOUNTER — Other Ambulatory Visit: Payer: Self-pay

## 2021-04-12 ENCOUNTER — Ambulatory Visit (INDEPENDENT_AMBULATORY_CARE_PROVIDER_SITE_OTHER): Payer: PPO | Admitting: Internal Medicine

## 2021-04-12 ENCOUNTER — Encounter: Payer: Self-pay | Admitting: Internal Medicine

## 2021-04-12 ENCOUNTER — Ambulatory Visit (INDEPENDENT_AMBULATORY_CARE_PROVIDER_SITE_OTHER): Payer: PPO

## 2021-04-12 VITALS — BP 146/90 | HR 90 | Temp 97.9°F | Resp 16 | Ht 63.5 in | Wt 186.4 lb

## 2021-04-12 DIAGNOSIS — R739 Hyperglycemia, unspecified: Secondary | ICD-10-CM

## 2021-04-12 DIAGNOSIS — E7800 Pure hypercholesterolemia, unspecified: Secondary | ICD-10-CM

## 2021-04-12 DIAGNOSIS — M40209 Unspecified kyphosis, site unspecified: Secondary | ICD-10-CM | POA: Diagnosis not present

## 2021-04-12 DIAGNOSIS — D649 Anemia, unspecified: Secondary | ICD-10-CM

## 2021-04-12 DIAGNOSIS — I10 Essential (primary) hypertension: Secondary | ICD-10-CM

## 2021-04-12 DIAGNOSIS — R918 Other nonspecific abnormal finding of lung field: Secondary | ICD-10-CM | POA: Diagnosis not present

## 2021-04-12 DIAGNOSIS — N1831 Chronic kidney disease, stage 3a: Secondary | ICD-10-CM

## 2021-04-12 DIAGNOSIS — I517 Cardiomegaly: Secondary | ICD-10-CM | POA: Diagnosis not present

## 2021-04-12 DIAGNOSIS — R062 Wheezing: Secondary | ICD-10-CM | POA: Diagnosis not present

## 2021-04-12 LAB — CBC WITH DIFFERENTIAL/PLATELET
Basophils Absolute: 0.1 10*3/uL (ref 0.0–0.1)
Basophils Relative: 1 % (ref 0.0–3.0)
Eosinophils Absolute: 0.2 10*3/uL (ref 0.0–0.7)
Eosinophils Relative: 1.3 % (ref 0.0–5.0)
HCT: 39.2 % (ref 36.0–46.0)
Hemoglobin: 12.9 g/dL (ref 12.0–15.0)
Lymphocytes Relative: 19.5 % (ref 12.0–46.0)
Lymphs Abs: 2.8 10*3/uL (ref 0.7–4.0)
MCHC: 33 g/dL (ref 30.0–36.0)
MCV: 90.7 fl (ref 78.0–100.0)
Monocytes Absolute: 0.9 10*3/uL (ref 0.1–1.0)
Monocytes Relative: 6.3 % (ref 3.0–12.0)
Neutro Abs: 10.5 10*3/uL — ABNORMAL HIGH (ref 1.4–7.7)
Neutrophils Relative %: 71.9 % (ref 43.0–77.0)
Platelets: 288 10*3/uL (ref 150.0–400.0)
RBC: 4.32 Mil/uL (ref 3.87–5.11)
RDW: 16.4 % — ABNORMAL HIGH (ref 11.5–15.5)
WBC: 14.6 10*3/uL — ABNORMAL HIGH (ref 4.0–10.5)

## 2021-04-12 LAB — BASIC METABOLIC PANEL
BUN: 24 mg/dL — ABNORMAL HIGH (ref 6–23)
CO2: 31 mEq/L (ref 19–32)
Calcium: 10 mg/dL (ref 8.4–10.5)
Chloride: 97 mEq/L (ref 96–112)
Creatinine, Ser: 1.14 mg/dL (ref 0.40–1.20)
GFR: 44.78 mL/min — ABNORMAL LOW (ref >60.00)
Glucose, Bld: 215 mg/dL — ABNORMAL HIGH (ref 70–99)
Potassium: 4.7 mEq/L (ref 3.5–5.1)
Sodium: 134 mEq/L — ABNORMAL LOW (ref 135–145)

## 2021-04-12 LAB — HEPATIC FUNCTION PANEL
ALT: 20 U/L (ref 0–35)
AST: 16 U/L (ref 0–37)
Albumin: 4.4 g/dL (ref 3.5–5.2)
Alkaline Phosphatase: 84 U/L (ref 39–117)
Bilirubin, Direct: 0.1 mg/dL (ref 0.0–0.3)
Total Bilirubin: 0.5 mg/dL (ref 0.2–1.2)
Total Protein: 7.3 g/dL (ref 6.0–8.3)

## 2021-04-12 LAB — HEMOGLOBIN A1C: Hgb A1c MFr Bld: 7.8 % — ABNORMAL HIGH (ref 4.6–6.5)

## 2021-04-12 LAB — LIPID PANEL
Cholesterol: 232 mg/dL — ABNORMAL HIGH (ref 0–200)
HDL: 42.9 mg/dL (ref >39.00)
NonHDL: 189.38
Total CHOL/HDL Ratio: 5
Triglycerides: 385 mg/dL — ABNORMAL HIGH (ref 0.0–149.0)
VLDL: 77 mg/dL — ABNORMAL HIGH (ref 0.0–40.0)

## 2021-04-12 LAB — LDL CHOLESTEROL, DIRECT: Direct LDL: 161 mg/dL

## 2021-04-12 LAB — FERRITIN: Ferritin: 114.5 ng/mL (ref 10.0–291.0)

## 2021-04-12 MED ORDER — PANTOPRAZOLE SODIUM 40 MG PO TBEC: 40.0000 mg | DELAYED_RELEASE_TABLET | Freq: Every day | ORAL | 3 refills | Status: DC

## 2021-04-12 NOTE — Assessment & Plan Note (Signed)
On benicar.  Blood pressures as outlined.  No changes.   Follow metabolic panel.  Follow pressures.   

## 2021-04-12 NOTE — Assessment & Plan Note (Addendum)
Avoid antiinflammatories.  Follow metabolic panel.  Continue benicar.  

## 2021-04-12 NOTE — Assessment & Plan Note (Signed)
Low carb diet and exercise.  Follow met b and a1c.

## 2021-04-12 NOTE — Assessment & Plan Note (Signed)
hgb decreased post surgery.   Follow cbc.

## 2021-04-12 NOTE — Progress Notes (Signed)
Patient ID: Mirayah Wren, female   DOB: 01-29-1941, 81 y.o.   MRN: [id number]   Subjective:    Patient ID: [name], female    DOB: September 24, 1940, 81 y.o.   MRN: [id number]  This visit occurred during the SARS-CoV-2 public health emergency.  Safety protocols were in place, including screening questions prior to the visit, additional usage of staff PPE, and extensive cleaning of exam room while observing appropriate contact time as indicated for disinfecting solutions.   Patient here for follow up appt.   Chief Complaint  Patient presents with   Wheezing   .   HPI Was scheduled for physical.  Changed to f/u appt.  Here to follow up regarding her blood pressure.  Also reports persistent phlegm in her throat.  States notices - intermittent - over the last couple of weeks.  Increased drainage.  No sore throat.  Wheezing at night.  Notices when lying down.  No increased cough.  Does report increased weight.  No chest pain.  No sob.  No acid reflux.  No abdominal pain.  Blood pressure - outside checks - 967-893 systolic readings.     Past Medical History:  Diagnosis Date   Anemia    Diverticulosis    GERD (gastroesophageal reflux disease)    Glaucoma    Hypercholesterolemia    Hypertension    Thyroid nodule    Past Surgical History:  Procedure Laterality Date   ABDOMINAL HYSTERECTOMY  1987   fibroids, ovaries not removed   TONSILLECTOMY     Family History  Problem Relation Age of Onset   Diabetes Father    Hypertension Father    Hypercholesterolemia Father    Heart disease Father        CABG   Hypertension Mother    Hypercholesterolemia Mother    Rheumatic fever Mother    Hypertension Brother    Diabetes Sister    Breast cancer Maternal Aunt    Breast cancer Cousin    Colon cancer Neg Hx    Social History   Socioeconomic History   Marital status: Divorced    Spouse name: Not on file   Number of children: 2   Years of education: Not on file   Highest  education level: Not on file  Occupational History   Not on file  Tobacco Use   Smoking status: Never   Smokeless tobacco: Never  Vaping Use   Vaping Use: Never used  Substance and Sexual Activity   Alcohol use: No    Alcohol/week: 0.0 standard drinks   Drug use: No   Sexual activity: Not Currently  Other Topics Concern   Not on file  Social History Narrative   Not on file   Social Determinants of Health   Financial Resource Strain: Low Risk    Difficulty of Paying Living Expenses: Not hard at all  Food Insecurity: No Food Insecurity   Worried About Charity fundraiser in the Last Year: Never true   [place] in the Last Year: Never true  Transportation Needs: No Transportation Needs   Lack of Transportation (Medical): No   Lack of Transportation (Non-Medical): No  Physical Activity: Unknown   Days of Exercise per Week: 0 days   Minutes of Exercise per Session: Not on file  Stress: No Stress Concern Present   Feeling of Stress : Not at all  Social Connections: Unknown   Frequency of Communication with Friends and Family: More than three times  a week   Frequency of Social Gatherings with Friends and Family: More than three times a week   Attends Religious Services: Not on file   Active Member of Clubs or Organizations: Not on file   Attends Archivist Meetings: Not on file   Marital Status: Not on file     Review of Systems  Constitutional:  Negative for appetite change and unexpected weight change.  HENT:  Negative for sinus pressure.        Phlegm in her throat.  Increased drainage.   Respiratory:  Negative for cough, chest tightness and shortness of breath.   Cardiovascular:  Negative for chest pain, palpitations and leg swelling.  Gastrointestinal:  Negative for abdominal pain, diarrhea, nausea and vomiting.  Genitourinary:  Negative for difficulty urinating and dysuria.  Musculoskeletal:  Negative for joint swelling and myalgias.  Skin:   Negative for color change and rash.  Neurological:  Negative for dizziness, light-headedness and headaches.  Psychiatric/Behavioral:  Negative for agitation and dysphoric mood.       Objective:     BP (!) 146/90    Pulse 90    Temp 97.9 F (36.6 C)    Resp 16    Ht 5' 3.5" (1.613 m)    Wt 186 lb 6.4 oz (84.6 kg)    LMP 02/21/1984    SpO2 97%    BMI 32.50 kg/m  Wt Readings from Last 3 Encounters:  04/12/21 186 lb 6.4 oz (84.6 kg)  09/15/20 176 lb (79.8 kg)  07/14/20 176 lb (79.8 kg)    Physical Exam Vitals reviewed.  Constitutional:      General: She is not in acute distress.    Appearance: Normal appearance.  HENT:     Head: Normocephalic and atraumatic.     Right Ear: External ear normal.     Left Ear: External ear normal.  Eyes:     General: No scleral icterus.       Right eye: No discharge.        Left eye: No discharge.     Conjunctiva/sclera: Conjunctivae normal.  Neck:     Thyroid: No thyromegaly.  Cardiovascular:     Rate and Rhythm: Normal rate and regular rhythm.  Pulmonary:     Effort: No respiratory distress.     Breath sounds: Normal breath sounds. No wheezing.  Abdominal:     General: Bowel sounds are normal.     Palpations: Abdomen is soft.     Tenderness: There is no abdominal tenderness.  Musculoskeletal:        General: No swelling or tenderness.     Cervical back: Neck supple. No tenderness.  Lymphadenopathy:     Cervical: No cervical adenopathy.  Skin:    Findings: No erythema or rash.  Neurological:     Mental Status: She is alert.  Psychiatric:        Mood and Affect: Mood normal.        Behavior: Behavior normal.     Outpatient Encounter Medications as of 04/12/2021  Medication Sig   pantoprazole (PROTONIX) 40 MG tablet Take 1 tablet (40 mg total) by mouth daily.   acetaminophen (TYLENOL) 325 MG tablet Take 650 mg by mouth every 4 (four) hours as needed.   Alpha-Lipoic Acid 200 MG CAPS Take 200 mg by mouth daily.   Bioflavonoid  Products (BIOFLEX PO) Take 1 tablet by mouth daily.    fish oil-omega-3 fatty acids 1000 MG capsule Take 1 capsule (1  g total) by mouth daily.   Multiple Vitamin (MULTIVITAMIN) tablet Take 1 tablet by mouth daily.   olmesartan (BENICAR) 20 MG tablet TAKE 1 TABLET BY MOUTH EVERY DAY   Facility-Administered Encounter Medications as of 04/12/2021  Medication   cyanocobalamin ((VITAMIN B-12)) injection 1,000 mcg     Lab Results  Component Value Date   WBC 14.6 (H) 04/12/2021   HGB 12.9 04/12/2021   HCT 39.2 04/12/2021   PLT 288.0 04/12/2021   GLUCOSE 215 (H) 04/12/2021   CHOL 232 (H) 04/12/2021   TRIG 385.0 (H) 04/12/2021   HDL 42.90 04/12/2021   LDLDIRECT 161.0 04/12/2021   LDLCALC 165 (H) 01/25/2019   ALT 20 04/12/2021   AST 16 04/12/2021   NA 134 (L) 04/12/2021   K 4.7 04/12/2021   CL 97 04/12/2021   CREATININE 1.14 04/12/2021   BUN 24 (H) 04/12/2021   CO2 31 04/12/2021   TSH 2.71 01/08/2021   HGBA1C 7.8 (H) 04/12/2021    US BREAST LTD UNI LEFT INC AXILLA  Result Date: 05/10/2019 CLINICAL DATA:  Possible mass in the 12 o'clock position of the right breast posteriorly and possible mass in the upper outer left breast posteriorly on a recent screening mammogram. EXAM: DIGITAL DIAGNOSTIC BILATERAL MAMMOGRAM WITH TOMO ULTRASOUND BILATERAL BREAST COMPARISON:  Previous exam(s). ACR Breast Density Category c: The breast tissue is heterogeneously dense, which may obscure small masses. FINDINGS: 3D tomographic and 2D generated spot compression images of both breasts demonstrate an approximately 1 cm oval, partially circumscribed and partially obscured mass in the superior aspect of right breast in the oblique projection, not seen in the craniocaudal projection. There is also an approximately 1 cm oval, partially circumscribed and partially obscured mass in the posterior aspect of the upper-outer left breast in approximately the 2 to 3 o'clock position. Targeted ultrasound is performed,  showing a 1.4 cm cyst containing a small amount of internal echoes and partial thin internal septation in the 11:30 o'clock position of the right breast, 3 cm from the nipple, corresponding to the mammographic mass. A 9 mm simple cyst is demonstrated in the 2 o'clock position of the left breast, 8 cm from the nipple, corresponding to the mammographic mass. There is also a nearby 6 mm cyst containing a thin partial internal septation. IMPRESSION: Benign bilateral breast cysts.  No evidence of malignancy. RECOMMENDATION: Bilateral screening mammogram in 1 year. I have discussed the findings and recommendations with the patient. If applicable, a reminder letter will be sent to the patient regarding the next appointment. BI-RADS CATEGORY  2: Benign. Electronically Signed   By: Claudie Revering M.D.   On: 05/10/2019 14:37   US BREAST LTD UNI RIGHT INC AXILLA  Result Date: 05/10/2019 CLINICAL DATA:  Possible mass in the 12 o'clock position of the right breast posteriorly and possible mass in the upper outer left breast posteriorly on a recent screening mammogram. EXAM: DIGITAL DIAGNOSTIC BILATERAL MAMMOGRAM WITH TOMO ULTRASOUND BILATERAL BREAST COMPARISON:  Previous exam(s). ACR Breast Density Category c: The breast tissue is heterogeneously dense, which may obscure small masses. FINDINGS: 3D tomographic and 2D generated spot compression images of both breasts demonstrate an approximately 1 cm oval, partially circumscribed and partially obscured mass in the superior aspect of right breast in the oblique projection, not seen in the craniocaudal projection. There is also an approximately 1 cm oval, partially circumscribed and partially obscured mass in the posterior aspect of the upper-outer left breast in approximately the 2 to 3 o'clock position.  Targeted ultrasound is performed, showing a 1.4 cm cyst containing a small amount of internal echoes and partial thin internal septation in the 11:30 o'clock position of the  right breast, 3 cm from the nipple, corresponding to the mammographic mass. A 9 mm simple cyst is demonstrated in the 2 o'clock position of the left breast, 8 cm from the nipple, corresponding to the mammographic mass. There is also a nearby 6 mm cyst containing a thin partial internal septation. IMPRESSION: Benign bilateral breast cysts.  No evidence of malignancy. RECOMMENDATION: Bilateral screening mammogram in 1 year. I have discussed the findings and recommendations with the patient. If applicable, a reminder letter will be sent to the patient regarding the next appointment. BI-RADS CATEGORY  2: Benign. Electronically Signed   By: Claudie Revering M.D.   On: 05/10/2019 14:37   MM DIAG BREAST TOMO BILATERAL  Result Date: 05/10/2019 CLINICAL DATA:  Possible mass in the 12 o'clock position of the right breast posteriorly and possible mass in the upper outer left breast posteriorly on a recent screening mammogram. EXAM: DIGITAL DIAGNOSTIC BILATERAL MAMMOGRAM WITH TOMO ULTRASOUND BILATERAL BREAST COMPARISON:  Previous exam(s). ACR Breast Density Category c: The breast tissue is heterogeneously dense, which may obscure small masses. FINDINGS: 3D tomographic and 2D generated spot compression images of both breasts demonstrate an approximately 1 cm oval, partially circumscribed and partially obscured mass in the superior aspect of right breast in the oblique projection, not seen in the craniocaudal projection. There is also an approximately 1 cm oval, partially circumscribed and partially obscured mass in the posterior aspect of the upper-outer left breast in approximately the 2 to 3 o'clock position. Targeted ultrasound is performed, showing a 1.4 cm cyst containing a small amount of internal echoes and partial thin internal septation in the 11:30 o'clock position of the right breast, 3 cm from the nipple, corresponding to the mammographic mass. A 9 mm simple cyst is demonstrated in the 2 o'clock position of the left  breast, 8 cm from the nipple, corresponding to the mammographic mass. There is also a nearby 6 mm cyst containing a thin partial internal septation. IMPRESSION: Benign bilateral breast cysts.  No evidence of malignancy. RECOMMENDATION: Bilateral screening mammogram in 1 year. I have discussed the findings and recommendations with the patient. If applicable, a reminder letter will be sent to the patient regarding the next appointment. BI-RADS CATEGORY  2: Benign. Electronically Signed   By: Claudie Revering M.D.   On: 05/10/2019 14:37       Assessment & Plan:   Problem List Items Addressed This Visit     Anemia    hgb decreased post surgery.   Follow cbc.       Relevant Orders   CBC with Differential/Platelet (Completed)   Ferritin (Completed)   CKD (chronic kidney disease) stage 3, GFR 30-59 ml/min (HCC)    Avoid antiinflammatories.  Follow metabolic panel.  Continue benicar.       Hypercholesterolemia    Declines statin medication.  Low cholesterol diet and exercise. Follow lipid panel and liver function tests.        Relevant Orders   Lipid panel (Completed)   Hepatic function panel (Completed)   Hyperglycemia    Low carb diet and exercise.  Follow met b and a1c.       Relevant Orders   Hemoglobin A1c (Completed)   Hypertension - Primary    On benicar.  Blood pressures as outlined.  No changes.   Follow metabolic  panel.  Follow pressures.        Relevant Orders   Basic metabolic panel (Completed)   Wheezing    With reports of phlegm in her throat, increased drainage and wheezing - worse when lying down.  Discussed robitussin DM and nasacort.  Also restart PPI - protonix $RemoveBe'40mg'[id number]$  q day.  Follow.  Check cxr.       Relevant Orders   DG Chest 2 View (Completed)     Einar Pheasant, MD

## 2021-04-12 NOTE — Patient Instructions (Signed)
Robitussin DM take twice a day as needed for cough and congestion.   Nasacort nasal spray - 2 sprays each nostril one time per dya.  Do this in the evening.

## 2021-04-12 NOTE — Assessment & Plan Note (Signed)
With reports of phlegm in her throat, increased drainage and wheezing - worse when lying down.  Discussed robitussin DM and nasacort.  Also restart PPI - protonix 40mg  q day.  Follow.  Check cxr.

## 2021-04-12 NOTE — Assessment & Plan Note (Signed)
Declines statin medication.  Low cholesterol diet and exercise. Follow lipid panel and liver function tests.

## 2021-04-20 ENCOUNTER — Ambulatory Visit (INDEPENDENT_AMBULATORY_CARE_PROVIDER_SITE_OTHER): Payer: PPO | Admitting: Internal Medicine

## 2021-04-20 ENCOUNTER — Encounter: Payer: Self-pay | Admitting: Internal Medicine

## 2021-04-20 ENCOUNTER — Other Ambulatory Visit: Payer: Self-pay

## 2021-04-20 DIAGNOSIS — I10 Essential (primary) hypertension: Secondary | ICD-10-CM

## 2021-04-20 DIAGNOSIS — E7800 Pure hypercholesterolemia, unspecified: Secondary | ICD-10-CM | POA: Diagnosis not present

## 2021-04-20 DIAGNOSIS — E1165 Type 2 diabetes mellitus with hyperglycemia: Secondary | ICD-10-CM

## 2021-04-20 DIAGNOSIS — N1831 Chronic kidney disease, stage 3a: Secondary | ICD-10-CM

## 2021-04-20 DIAGNOSIS — D649 Anemia, unspecified: Secondary | ICD-10-CM | POA: Diagnosis not present

## 2021-04-20 DIAGNOSIS — D72829 Elevated white blood cell count, unspecified: Secondary | ICD-10-CM | POA: Diagnosis not present

## 2021-04-20 DIAGNOSIS — R062 Wheezing: Secondary | ICD-10-CM

## 2021-04-20 DIAGNOSIS — E042 Nontoxic multinodular goiter: Secondary | ICD-10-CM

## 2021-04-20 NOTE — Progress Notes (Signed)
Patient ID: Cathy Vazquez, female   DOB: February 16, 1941, 81 y.o.   MRN: [id number]   Virtual Visit via telephone Note  This visit type was conducted due to national recommendations for restrictions regarding the COVID-19 pandemic (e.g. social distancing).  This format is felt to be most appropriate for this patient at this time.  All issues noted in this document were discussed and addressed.  No physical exam was performed (except for noted visual exam findings with Video Visits).   I connected with Cathy Vazquez by telephone and verified that I am speaking with the correct person using two identifiers. Location patient: home Location provider: work Persons participating in the telephone visit: patient, provider  The limitations, risks, security and privacy concerns of performing an evaluation and management service by telephone and the availability of in person appointments have been discussed.  It has also been discussed with the patient that there may be a patient responsible charge related to this service. The patient expressed understanding and agreed to proceed.   Reason for visit: work in appt  HPI: Work in to discuss recent labs.  Was seen 04/12/21 - wheezing - increased phlegm.  Treated with robitussin DM, nasacort and started on protonix.  Is better.  White blood cell count elevated - recent labs.  A1c elevated as well - 7.8.  increased from last check 6.7.  discussed diet and exercise and Lifestyles.  She declines Lifestyles referral.  Discussed medication. Wants to hold on medication.  Did agree to [facility] referral.  No chest pain.  No sob reported.  No abdominal pain.  Bowels moving.  Discussed elevated cholesterol.  [facility] Lipid diet.  Hip is better.  Has f/u with ortho 06/2021.     ROS: See pertinent positives and negatives per HPI.  Past Medical History:  Diagnosis Date   Anemia    Diverticulosis    GERD (gastroesophageal reflux disease)    Glaucoma    Hypercholesterolemia     Hypertension    Thyroid nodule     Past Surgical History:  Procedure Laterality Date   ABDOMINAL HYSTERECTOMY  1987   fibroids, ovaries not removed   TONSILLECTOMY      Family History  Problem Relation Age of Onset   Diabetes Father    Hypertension Father    Hypercholesterolemia Father    Heart disease Father        CABG   Hypertension Mother    Hypercholesterolemia Mother    Rheumatic fever Mother    Hypertension Brother    Diabetes Sister    Breast cancer Maternal Aunt    Breast cancer Cousin    Colon cancer Neg Hx     SOCIAL HX: reviewed.    Current Outpatient Medications:    acetaminophen (TYLENOL) 325 MG tablet, Take 650 mg by mouth every 4 (four) hours as needed., Disp: , Rfl:    Alpha-Lipoic Acid 200 MG CAPS, Take 200 mg by mouth daily., Disp: , Rfl:    Bioflavonoid Products (BIOFLEX PO), Take 1 tablet by mouth daily. , Disp: , Rfl:    fish oil-omega-3 fatty acids 1000 MG capsule, Take 1 capsule (1 g total) by mouth daily., Disp: , Rfl:    Multiple Vitamin (MULTIVITAMIN) tablet, Take 1 tablet by mouth daily., Disp: , Rfl:    olmesartan (BENICAR) 20 MG tablet, TAKE 1 TABLET BY MOUTH EVERY DAY, Disp: 90 tablet, Rfl: 1  Current Facility-Administered Medications:    cyanocobalamin ((VITAMIN B-12)) injection 1,000 mcg, 1,000 mcg, Intramuscular,  Q30 days, Einar Pheasant, MD, 1,000 mcg at 03/29/21 1530  EXAM:  GENERAL: alert. Sounds to be in no acute distress.  Answering questions appropriately.   PSYCH/NEURO: pleasant and cooperative, no obvious depression or anxiety, speech and thought processing grossly intact  ASSESSMENT AND PLAN:  Discussed the following assessment and plan:  Problem List Items Addressed This Visit     Anemia    Recent hgb wnl.  Follow.       CKD (chronic kidney disease) stage 3, GFR 30-59 ml/min (HCC)    Avoid antiinflammatories.  Follow metabolic panel.  Continue benicar.       Hypercholesteremia    [facility] Lipid diet.  Discussed  calculated cholesterol risk and need for cholesterol medication.  Declines.  Follow lipid panel.       Hypertension    On benicar.  Blood pressures as outlined.  No changes.   Follow metabolic panel.  Follow pressures.        Leukocytosis    Recheck cbc to confirm wnl.       Multiple thyroid nodules    Has seen endocrinology previously.  Follow tsh.       Type 2 diabetes mellitus with hyperglycemia (HCC)    Discussed elevated a1c 7.8.  Discussed diabetes.  Discussed low carb diet and exercise.  Discussed the need for medication.  She declined Lifestyles referral.  Declined medication.  Did agree for CCM referral.        Wheezing    Previously reported phlegm in her throat, increased drainage and wheezing - worse when lying down.  Prescribed  robitussin DM and nasacort.  Also started PPI - protonix 40mg  q day.  Is better.  cxr with coarsened interstitial markings.  No acute abnormality.  Symptoms improved.  Follow.  Consider pulmonary evaluation - PFTs, etc.        Return if symptoms worsen or fail to improve, for keep scheduled. .   I discussed the assessment and treatment plan with the patient. The patient was provided an opportunity to ask questions and all were answered. The patient agreed with the plan and demonstrated an understanding of the instructions.   The patient was advised to call back or seek an in-person evaluation if the symptoms worsen or if the condition fails to improve as anticipated.  I provided 23 minutes of non-face-to-face time during this encounter.   Einar Pheasant, MD

## 2021-04-25 ENCOUNTER — Encounter: Payer: Self-pay | Admitting: Internal Medicine

## 2021-04-25 DIAGNOSIS — E7800 Pure hypercholesterolemia, unspecified: Secondary | ICD-10-CM | POA: Insufficient documentation

## 2021-04-25 NOTE — Assessment & Plan Note (Signed)
Recent hgb wnl.  Follow.   

## 2021-04-25 NOTE — Assessment & Plan Note (Signed)
[facility] Lipid diet.  Discussed calculated cholesterol risk and need for cholesterol medication.  Declines.  Follow lipid panel.  ?

## 2021-04-25 NOTE — Assessment & Plan Note (Signed)
Previously reported phlegm in her throat, increased drainage and wheezing - worse when lying down.  Prescribed  robitussin DM and nasacort.  Also started PPI - protonix '40mg'$  q day.  Is better.  cxr with coarsened interstitial markings.  No acute abnormality.  Symptoms improved.  Follow.  Consider pulmonary evaluation - PFTs, etc.  ?

## 2021-04-25 NOTE — Assessment & Plan Note (Signed)
Discussed elevated a1c 7.8.  Discussed diabetes.  Discussed low carb diet and exercise.  Discussed the need for medication.  She declined Lifestyles referral.  Declined medication.  Did agree for CCM referral.   ?

## 2021-04-25 NOTE — Assessment & Plan Note (Signed)
Has seen endocrinology previously.  Follow tsh.  ?

## 2021-04-25 NOTE — Assessment & Plan Note (Signed)
Avoid antiinflammatories.  Follow metabolic panel.  Continue benicar.  

## 2021-04-25 NOTE — Assessment & Plan Note (Signed)
On benicar.  Blood pressures as outlined.  No changes.   Follow metabolic panel.  Follow pressures.   

## 2021-04-25 NOTE — Assessment & Plan Note (Signed)
Recheck cbc to confirm wnl.  

## 2021-04-26 ENCOUNTER — Other Ambulatory Visit: Payer: PPO

## 2021-04-26 ENCOUNTER — Ambulatory Visit: Payer: PPO

## 2021-04-30 ENCOUNTER — Other Ambulatory Visit: Payer: Self-pay | Admitting: *Deleted

## 2021-04-30 DIAGNOSIS — D72829 Elevated white blood cell count, unspecified: Secondary | ICD-10-CM

## 2021-05-07 ENCOUNTER — Other Ambulatory Visit: Payer: PPO

## 2021-05-11 ENCOUNTER — Encounter: Payer: Self-pay | Admitting: Internal Medicine

## 2021-05-11 ENCOUNTER — Ambulatory Visit (INDEPENDENT_AMBULATORY_CARE_PROVIDER_SITE_OTHER): Payer: PPO | Admitting: Internal Medicine

## 2021-05-11 ENCOUNTER — Other Ambulatory Visit: Payer: Self-pay

## 2021-05-11 DIAGNOSIS — E538 Deficiency of other specified B group vitamins: Secondary | ICD-10-CM

## 2021-05-11 DIAGNOSIS — D72829 Elevated white blood cell count, unspecified: Secondary | ICD-10-CM | POA: Diagnosis not present

## 2021-05-11 DIAGNOSIS — E7800 Pure hypercholesterolemia, unspecified: Secondary | ICD-10-CM | POA: Diagnosis not present

## 2021-05-11 DIAGNOSIS — E1165 Type 2 diabetes mellitus with hyperglycemia: Secondary | ICD-10-CM | POA: Diagnosis not present

## 2021-05-11 DIAGNOSIS — I10 Essential (primary) hypertension: Secondary | ICD-10-CM

## 2021-05-11 DIAGNOSIS — N1831 Chronic kidney disease, stage 3a: Secondary | ICD-10-CM | POA: Diagnosis not present

## 2021-05-11 DIAGNOSIS — E042 Nontoxic multinodular goiter: Secondary | ICD-10-CM | POA: Diagnosis not present

## 2021-05-11 LAB — CBC WITH DIFFERENTIAL/PLATELET
Basophils Absolute: 0.1 10*3/uL (ref 0.0–0.1)
Basophils Relative: 1 % (ref 0.0–3.0)
Eosinophils Absolute: 0.3 10*3/uL (ref 0.0–0.7)
Eosinophils Relative: 2.3 % (ref 0.0–5.0)
HCT: 38.4 % (ref 36.0–46.0)
Hemoglobin: 12.7 g/dL (ref 12.0–15.0)
Lymphocytes Relative: 22.3 % (ref 12.0–46.0)
Lymphs Abs: 2.7 10*3/uL (ref 0.7–4.0)
MCHC: 33.2 g/dL (ref 30.0–36.0)
MCV: 87.6 fl (ref 78.0–100.0)
Monocytes Absolute: 0.7 10*3/uL (ref 0.1–1.0)
Monocytes Relative: 6.1 % (ref 3.0–12.0)
Neutro Abs: 8.2 10*3/uL — ABNORMAL HIGH (ref 1.4–7.7)
Neutrophils Relative %: 68.3 % (ref 43.0–77.0)
Platelets: 278 10*3/uL (ref 150.0–400.0)
RBC: 4.38 Mil/uL (ref 3.87–5.11)
RDW: 15.9 % — ABNORMAL HIGH (ref 11.5–15.5)
WBC: 11.9 10*3/uL — ABNORMAL HIGH (ref 4.0–10.5)

## 2021-05-11 LAB — HM DIABETES FOOT EXAM

## 2021-05-11 MED ORDER — CYANOCOBALAMIN 1000 MCG/ML IJ SOLN: 1000.0000 ug | Freq: Once | INTRAMUSCULAR | Status: AC

## 2021-05-11 MED ORDER — FREESTYLE LIBRE 3 SENSOR MISC: 1.0000 | 11 refills | Status: DC

## 2021-05-11 NOTE — Progress Notes (Signed)
Patient ID: Cathy Vazquez, female   DOB: January 10, 1941, 81 y.o.   MRN: [id number] ? ? ?Subjective:  ? ? Patient ID: Cathy Vazquez, female    DOB: 12-Jun-1940, 81 y.o.   MRN: [id number] ? ?This visit occurred during the SARS-CoV-2 public health emergency.  Safety protocols were in place, including screening questions prior to the visit, additional usage of staff PPE, and extensive cleaning of exam room while observing appropriate contact time as indicated for disinfecting solutions.  ? ?Patient here for a scheduled follow up.  ?Chief Complaint  ?Patient presents with  ? Follow-up  ?  1 mo follow up for b12 inj and labs  ? .  ? ?HPI ?Here to follow up regarding her blood sugars and blood pressure.  Recent evaluation - a1c 7.8.  (increased).  Declined medication.  Wanted to work on diet.  Reports since her last visit, she has adjusted her diet.  Has cut out bread and sweets.  She is walking.  Her knee is doing well. Able to walk more now.  No knee pain.  No chest pain or sob reported.  No increased cough or congestion.  No acid reglux.  No abdominal pain.  Bowels stable.  States home blood pressures averaging in 400Q systolic readings.  Discussed checking sugars.  She does not have glucometer.  Instructed today.  See if can get CGM.  Also discussed Lifestyles referral.  ? ? ?Past Medical History:  ?Diagnosis Date  ? Anemia   ? Diverticulosis   ? GERD (gastroesophageal reflux disease)   ? Glaucoma   ? Hypercholesterolemia   ? Hypertension   ? Thyroid nodule   ? ?Past Surgical History:  ?Procedure Laterality Date  ? ABDOMINAL HYSTERECTOMY  1987  ? fibroids, ovaries not removed  ? TONSILLECTOMY    ? ?Family History  ?Problem Relation Age of Onset  ? Diabetes Father   ? Hypertension Father   ? Hypercholesterolemia Father   ? Heart disease Father   ?     CABG  ? Hypertension Mother   ? Hypercholesterolemia Mother   ? Rheumatic fever Mother   ? Hypertension Brother   ? Diabetes Sister   ? Breast cancer Maternal Aunt   ?  Breast cancer Cousin   ? Colon cancer Neg Hx   ? ?Social History  ? ?Socioeconomic History  ? Marital status: Divorced  ?  Spouse name: Not on file  ? Number of children: 2  ? Years of education: Not on file  ? Highest education level: Not on file  ?Occupational History  ? Not on file  ?Tobacco Use  ? Smoking status: Never  ? Smokeless tobacco: Never  ?Vaping Use  ? Vaping Use: Never used  ?Substance and Sexual Activity  ? Alcohol use: No  ?  Alcohol/week: 0.0 standard drinks  ? Drug use: No  ? Sexual activity: Not Currently  ?Other Topics Concern  ? Not on file  ?Social History Narrative  ? Not on file  ? ?Social Determinants of Health  ? ?Financial Resource Strain: Not on file  ?Food Insecurity: Not on file  ?Transportation Needs: Not on file  ?Physical Activity: Not on file  ?Stress: Not on file  ?Social Connections: Not on file  ? ? ? ?Review of Systems  ?Constitutional:  Negative for appetite change and unexpected weight change.  ?HENT:  Negative for congestion and sinus pressure.   ?Respiratory:  Negative for cough, chest tightness and shortness of breath.   ?Cardiovascular:  Negative for chest pain, palpitations and leg swelling.  ?Gastrointestinal:  Negative for abdominal pain, diarrhea, nausea and vomiting.  ?Genitourinary:  Negative for difficulty urinating and dysuria.  ?Musculoskeletal:  Negative for joint swelling and myalgias.  ?Skin:  Negative for color change and rash.  ?Neurological:  Negative for dizziness, light-headedness and headaches.  ?Psychiatric/Behavioral:  Negative for agitation and dysphoric mood.   ? ?   ?Objective:  ?  ? ?BP 134/84 (BP Location: Left Arm, Patient Position: Sitting, Cuff Size: Small)   Pulse 74   Temp 98 ?F (36.7 ?C) (Oral)   Ht '5\' 3"'$  (1.6 m)   Wt 186 lb 3.2 oz (84.5 kg)   LMP 02/21/1984   SpO2 98%   BMI 32.98 kg/m?  ?Wt Readings from Last 3 Encounters:  ?05/11/21 186 lb 3.2 oz (84.5 kg)  ?04/20/21 184 lb (83.5 kg)  ?04/12/21 186 lb 6.4 oz (84.6 kg)  ? ? ?Physical  Exam ?Vitals reviewed.  ?Constitutional:   ?   General: She is not in acute distress. ?   Appearance: Normal appearance.  ?HENT:  ?   Head: Normocephalic and atraumatic.  ?   Right Ear: External ear normal.  ?   Left Ear: External ear normal.  ?Eyes:  ?   General: No scleral icterus.    ?   Right eye: No discharge.     ?   Left eye: No discharge.  ?   Conjunctiva/sclera: Conjunctivae normal.  ?Neck:  ?   Thyroid: No thyromegaly.  ?Cardiovascular:  ?   Rate and Rhythm: Normal rate and regular rhythm.  ?Pulmonary:  ?   Effort: No respiratory distress.  ?   Breath sounds: Normal breath sounds. No wheezing.  ?Abdominal:  ?   General: Bowel sounds are normal.  ?   Palpations: Abdomen is soft.  ?   Tenderness: There is no abdominal tenderness.  ?Musculoskeletal:     ?   General: No swelling or tenderness.  ?   Cervical back: Neck supple. No tenderness.  ?Lymphadenopathy:  ?   Cervical: No cervical adenopathy.  ?Skin: ?   Findings: No erythema or rash.  ?Neurological:  ?   Mental Status: She is alert.  ?Psychiatric:     ?   Mood and Affect: Mood normal.     ?   Behavior: Behavior normal.  ? ? ? ?Outpatient Encounter Medications as of 05/11/2021  ?Medication Sig  ? acetaminophen (TYLENOL) 325 MG tablet Take 650 mg by mouth every 4 (four) hours as needed.  ? Alpha-Lipoic Acid 200 MG CAPS Take 200 mg by mouth daily.  ? Bioflavonoid Products (BIOFLEX PO) Take 1 tablet by mouth daily.   ? Continuous Blood Gluc Sensor (FREESTYLE LIBRE 3 SENSOR) MISC 1 applicator by Does not apply route every 14 (fourteen) days.  ? fish oil-omega-3 fatty acids 1000 MG capsule Take 1 capsule (1 g total) by mouth daily.  ? Multiple Vitamin (MULTIVITAMIN) tablet Take 1 tablet by mouth daily.  ? olmesartan (BENICAR) 20 MG tablet TAKE 1 TABLET BY MOUTH EVERY DAY  ? ?Facility-Administered Encounter Medications as of 05/11/2021  ?Medication  ? cyanocobalamin ((VITAMIN B-12)) injection 1,000 mcg  ? cyanocobalamin ((VITAMIN B-12)) injection 1,000 mcg  ?   ? ?Lab Results  ?Component Value Date  ? WBC 11.9 (H) 05/11/2021  ? HGB 12.7 05/11/2021  ? HCT 38.4 05/11/2021  ? PLT 278.0 05/11/2021  ? GLUCOSE 215 (H) 04/12/2021  ? CHOL 232 (H) 04/12/2021  ? TRIG 385.0 (  H) 04/12/2021  ? HDL 42.90 04/12/2021  ? LDLDIRECT 161.0 04/12/2021  ? LDLCALC 165 (H) 01/25/2019  ? ALT 20 04/12/2021  ? AST 16 04/12/2021  ? NA 134 (L) 04/12/2021  ? K 4.7 04/12/2021  ? CL 97 04/12/2021  ? CREATININE 1.14 04/12/2021  ? BUN 24 (H) 04/12/2021  ? CO2 31 04/12/2021  ? TSH 2.71 01/08/2021  ? HGBA1C 7.8 (H) 04/12/2021  ? ? ?US BREAST LTD UNI LEFT INC AXILLA ? ?Result Date: 05/10/2019 ?CLINICAL DATA:  Possible mass in the 12 o'clock position of the right breast posteriorly and possible mass in the upper outer left breast posteriorly on a recent screening mammogram. EXAM: DIGITAL DIAGNOSTIC BILATERAL MAMMOGRAM WITH TOMO ULTRASOUND BILATERAL BREAST COMPARISON:  Previous exam(s). ACR Breast Density Category c: The breast tissue is heterogeneously dense, which may obscure small masses. FINDINGS: 3D tomographic and 2D generated spot compression images of both breasts demonstrate an approximately 1 cm oval, partially circumscribed and partially obscured mass in the superior aspect of right breast in the oblique projection, not seen in the craniocaudal projection. There is also an approximately 1 cm oval, partially circumscribed and partially obscured mass in the posterior aspect of the upper-outer left breast in approximately the 2 to 3 o'clock position. Targeted ultrasound is performed, showing a 1.4 cm cyst containing a small amount of internal echoes and partial thin internal septation in the 11:30 o'clock position of the right breast, 3 cm from the nipple, corresponding to the mammographic mass. A 9 mm simple cyst is demonstrated in the 2 o'clock position of the left breast, 8 cm from the nipple, corresponding to the mammographic mass. There is also a nearby 6 mm cyst containing a thin partial internal  septation. IMPRESSION: Benign bilateral breast cysts.  No evidence of malignancy. RECOMMENDATION: Bilateral screening mammogram in 1 year. I have discussed the findings and recommendations with the patient. If

## 2021-05-12 ENCOUNTER — Ambulatory Visit: Payer: PPO

## 2021-05-12 ENCOUNTER — Encounter: Payer: Self-pay | Admitting: Internal Medicine

## 2021-05-12 ENCOUNTER — Telehealth: Payer: Self-pay | Admitting: Internal Medicine

## 2021-05-12 NOTE — Assessment & Plan Note (Signed)
[facility] Lipid diet.  Discussed calculated cholesterol risk and need for cholesterol medication.  Declines.  Follow lipid panel.  ?

## 2021-05-12 NOTE — Assessment & Plan Note (Addendum)
Has seen endocrinology previously.  Follow tsh.  See if agreeable for f/u thyroid ultrasound.  ?

## 2021-05-12 NOTE — Assessment & Plan Note (Signed)
Elevated last check.  Recheck cbc today.  ?

## 2021-05-12 NOTE — Telephone Encounter (Signed)
Pt returning call... Advise pt of below note... Pt understood her AWV date was reschedule for 05/18/2021 at 12:30pm...  ?

## 2021-05-12 NOTE — Assessment & Plan Note (Signed)
On benicar.  Blood pressures as outlined.  No changes.   Follow metabolic panel.  Follow pressures.   

## 2021-05-12 NOTE — Assessment & Plan Note (Signed)
Avoid antiinflammatories.  Follow metabolic panel.  Continue benicar.  

## 2021-05-12 NOTE — Assessment & Plan Note (Signed)
B12 injections 

## 2021-05-12 NOTE — Telephone Encounter (Signed)
Copied from [place]. Topic: Medicare AWV ?>> May 12, 2021  9:40 AM Harris-Coley, Hannah Beat wrote: ?Reason for CRM: LVM 05/12/21'@9'$ :39am AWV r/s to 05/18/21 '@12'$ :30p.  Please confirm appt change khc ?

## 2021-05-12 NOTE — Assessment & Plan Note (Signed)
Discussed elevated a1c 7.8.  Discussed diabetes.  Discussed low carb diet and exercise.  Have discussed medication.  She had wanted to hold on medication.  Wanted to work on diet and exercise. She has adjusted diet as outlined.  Discussed Lifestyles referral.  Wants to hold on referral at this time.  Will notify me when agreeable. Declined medication. Discussed need to check blood sugars.  Instructed - glucometer check - today.  See if can get CGM.  Follow sugars.  Follow met b and a1c.   ?

## 2021-05-18 ENCOUNTER — Ambulatory Visit (INDEPENDENT_AMBULATORY_CARE_PROVIDER_SITE_OTHER): Payer: PPO

## 2021-05-18 ENCOUNTER — Ambulatory Visit: Payer: PPO

## 2021-05-18 VITALS — Ht 63.0 in | Wt 186.0 lb

## 2021-05-18 DIAGNOSIS — Z0000 Encounter for general adult medical examination without abnormal findings: Secondary | ICD-10-CM

## 2021-05-18 DIAGNOSIS — Z742 Need for assistance at home and no other household member able to render care: Secondary | ICD-10-CM

## 2021-05-18 NOTE — Progress Notes (Signed)
Subjective:   Cathy Vazquez is a 81 y.o. female who presents for Medicare Annual (Subsequent) preventive examination.  Review of Systems    No ROS.  Medicare Wellness Virtual Visit.  Visual/audio telehealth visit, UTA vital signs.   See social history for additional risk factors.   Cardiac Risk Factors include: advanced age (>22men, >43 women);diabetes mellitus;hypertension     Objective:    Today's Vitals   05/18/21 1241  Weight: 186 lb (84.4 kg)  Height: 5\' 3"  (1.6 m)   Body mass index is 32.95 kg/m.     05/18/2021   12:47 PM 05/11/2020   12:37 PM 05/08/2019    1:13 PM 05/19/2018   10:17 AM 05/19/2018   10:16 AM 04/30/2018    3:06 PM 05/09/2017    3:06 PM  Advanced Directives  Does Patient Have a Medical Advance Directive? Yes Yes Yes  Yes Yes No;Yes  Type of Estate agent of [place];Living will Healthcare Power of [place];Living will Healthcare Power of [place];Living will  Healthcare Power of [employer] of [place];Living will Healthcare Power of Attorney  Does patient want to make changes to medical advance directive? No - Patient declined No - Patient declined No - Patient declined No - Patient declined  No - Patient declined No - Patient declined  Copy of Healthcare Power of Attorney in Chart? Yes - validated most recent copy scanned in chart (See row information) Yes - validated most recent copy scanned in chart (See row information) Yes - validated most recent copy scanned in chart (See row information) Yes - validated most recent copy scanned in chart (See row information)  Yes - validated most recent copy scanned in chart (See row information) Yes  Would patient like information on creating a medical advance directive?       No - Patient declined    Current Medications (verified) Outpatient Encounter Medications as of 05/18/2021  Medication Sig   acetaminophen (TYLENOL) 325 MG tablet Take 650 mg by mouth every 4 (four) hours  as needed.   Alpha-Lipoic Acid 200 MG CAPS Take 200 mg by mouth daily.   Bioflavonoid Products (BIOFLEX PO) Take 1 tablet by mouth daily.    Continuous Blood Gluc Sensor (FREESTYLE LIBRE 3 SENSOR) MISC 1 applicator by Does not apply route every 14 (fourteen) days.   fish oil-omega-3 fatty acids 1000 MG capsule Take 1 capsule (1 g total) by mouth daily.   Multiple Vitamin (MULTIVITAMIN) tablet Take 1 tablet by mouth daily.   olmesartan (BENICAR) 20 MG tablet TAKE 1 TABLET BY MOUTH EVERY DAY   Facility-Administered Encounter Medications as of 05/18/2021  Medication   cyanocobalamin ((VITAMIN B-12)) injection 1,000 mcg   cyanocobalamin ((VITAMIN B-12)) injection 1,000 mcg    Allergies (verified) Norvasc [amlodipine besylate], Contrast media [iodinated contrast media], Penicillins, Codeine, Diclofenac, Felodipine, Lipitor [atorvastatin], Stay awake [caffeine], Zocor [simvastatin], and Benicar [olmesartan]   History: Past Medical History:  Diagnosis Date   Anemia    Diverticulosis    GERD (gastroesophageal reflux disease)    Glaucoma    Hypercholesterolemia    Hypertension    Thyroid nodule    Past Surgical History:  Procedure Laterality Date   ABDOMINAL HYSTERECTOMY  1987   fibroids, ovaries not removed   TONSILLECTOMY     Family History  Problem Relation Age of Onset   Diabetes Father    Hypertension Father    Hypercholesterolemia Father    Heart disease Father  CABG   Hypertension Mother    Hypercholesterolemia Mother    Rheumatic fever Mother    Hypertension Brother    Diabetes Sister    Breast cancer Maternal Aunt    Breast cancer Cousin    Colon cancer Neg Hx    Social History   Socioeconomic History   Marital status: Divorced    Spouse name: Not on file   Number of children: 2   Years of education: Not on file   Highest education level: Not on file  Occupational History   Not on file  Tobacco Use   Smoking status: Never   Smokeless tobacco: Never   Vaping Use   Vaping Use: Never used  Substance and Sexual Activity   Alcohol use: No    Alcohol/week: 0.0 standard drinks   Drug use: No   Sexual activity: Not Currently  Other Topics Concern   Not on file  Social History Narrative   Not on file   Social Determinants of Health   Financial Resource Strain: Low Risk    Difficulty of Paying Living Expenses: Not hard at all  Food Insecurity: No Food Insecurity   Worried About Programme researcher, broadcasting/film/video in the Last Year: Never true   Ran Out of Food in the Last Year: Never true  Transportation Needs: No Transportation Needs   Lack of Transportation (Medical): No   Lack of Transportation (Non-Medical): No  Physical Activity: Not on file  Stress: Not on file  Social Connections: Unknown   Frequency of Communication with Friends and Family: More than three times a week   Frequency of Social Gatherings with Friends and Family: More than three times a week   Attends Religious Services: Not on Scientist, clinical (histocompatibility and immunogenetics) or Organizations: Not on file   Attends Banker Meetings: Not on file   Marital Status: Not on file    Tobacco Counseling Counseling given: Not Answered   Clinical Intake:  Pre-visit preparation completed: Yes        Diabetes: Yes (Followed by PCP)  How often do you need to have someone help you when you read instructions, pamphlets, or other written materials from your doctor or pharmacy?: 1 - Never  Interpreter Needed?: No      Activities of Daily Living    05/18/2021   12:48 PM  In your present state of health, do you have any difficulty performing the following activities:  Hearing? 0  Vision? 0  Difficulty concentrating or making decisions? 0  Walking or climbing stairs? 0  Dressing or bathing? 0  Doing errands, shopping? 1  Comment Family Ship broker and eating ? N  Using the Toilet? N  In the past six months, have you accidently leaked urine? N  Do you have problems  with loss of bowel control? N  Managing your Medications? N  Managing your Finances? N  Housekeeping or managing your Housekeeping? Y  Comment Family assist    Patient Care Team: Dale Mathiston, MD as PCP - General (Internal Medicine)  Indicate any recent Medical Services you may have received from other than [facility] providers in the past year (date may be approximate).     Assessment:   This is a routine wellness examination for Cathy Vazquez.  Virtual Visit via Telephone Note  I connected with  Cathy Vazquez on 05/18/21 at 12:30 PM EDT by telephone and verified that I am speaking with the correct person using two identifiers.  Persons participating in the virtual visit: patient/Nurse Health Advisor   I discussed the limitations of performing an evaluation and management service by telehealth. The patient expressed understanding and agreed to proceed.  We continued and completed visit with audio only.Some vital signs may be absent or patient reported.   Hearing/Vision screen Hearing Screening - Comments:: Patient is able to hear conversational tones without difficulty. No issues reported. Vision Screening - Comments:: Followed by [facility] Wears corrective lenses  Visits every 6 months  Cataract extraction, bilateral  Dietary issues and exercise activities discussed: Current Exercise Habits: Home exercise routine, Type of exercise: walking, Intensity: Mild Low carb diet Good water intake   Goals Addressed             This Visit's Progress    Follow up with Primary Care Provider       As needed.       Depression Screen    05/18/2021   12:44 PM 05/11/2021    1:00 PM 05/11/2020   12:39 PM 05/08/2019    1:14 PM 04/30/2018    3:07 PM 04/26/2017    3:40 PM 04/25/2016    3:26 PM  PHQ 2/9 Scores  PHQ - 2 Score 0 0 0 0 0 0 0    Fall Risk    05/18/2021   12:48 PM 05/11/2021    1:00 PM 04/20/2021    3:26 PM 05/11/2020   12:37 PM 10/17/2019   11:33 AM  Fall Risk    Falls in the past year? 0 0 0 0 0  Number falls in past yr: 0   0   Injury with Fall?   0 0   Risk for fall due to :  No Fall Risks     Follow up Falls evaluation completed Falls evaluation completed Falls evaluation completed Falls evaluation completed Falls evaluation completed    FALL RISK PREVENTION PERTAINING TO THE HOME: Home free of loose throw rugs in walkways, pet beds, electrical cords, etc? Yes  Adequate lighting in your home to reduce risk of falls? Yes   ASSISTIVE DEVICES UTILIZED TO PREVENT FALLS: Smart phone life alert? Yes  Use of a cane, walker or w/c? Yes , cane as needed. Grab bars in the bathroom? Yes  Shower chair or bench in shower? Yes  Elevated toilet seat or a handicapped toilet? Yes   TIMED UP AND GO: Was the test performed? No .   Cognitive Function: Patient is alert and oriented x3.  Normal cognitive status assessed by direct observation/communication. No abnormalities found.      04/26/2017    3:46 PM 04/25/2016    3:36 PM 02/26/2015    1:36 PM  MMSE - Mini Mental State Exam  Orientation to time 5 5 5   Orientation to Place 5 5 5   Registration 3 3 3   Attention/ Calculation 5 5 5   Recall 3 3 3   Language- name 2 objects 2 2 2   Language- repeat 1 1 1   Language- follow 3 step command 3 3 3   Language- read & follow direction 1 1 1   Write a sentence 1 1 1   Copy design 1 1 1   Total score 30 30 30         05/11/2020   12:49 PM 05/08/2019    1:28 PM 04/30/2018    3:07 PM  6CIT Screen  What Year? 0 points 0 points 0 points  What month? 0 points 0 points 0 points  What time? 0 points  0 points 0 points  Count back from 20 0 points 0 points 0 points  Months in reverse 0 points 0 points 0 points  Repeat phrase  0 points 0 points  Total Score  0 points 0 points    Immunizations Immunization History  Administered Date(s) Administered   Influenza Inj Mdck Quad Pf 05/01/2018   Influenza Split 10/29/2012   Influenza, High Dose Seasonal PF 01/05/2017,  12/13/2018   Pneumococcal Conjugate-13 10/29/2012   Tdap 05/19/2018   Zoster, Live 02/21/2009   Screening Tests Health Maintenance  Topic Date Due   COVID-19 Vaccine (1) 05/27/2021 (Originally 02/15/1941)   OPHTHALMOLOGY EXAM  06/21/2021 (Originally 04/21/2020)   Zoster Vaccines- Shingrix (1 of 2) 07/10/2021 (Originally 08/15/1959)   MAMMOGRAM  07/14/2021 (Originally 05/09/2020)   Pneumonia Vaccine 21+ Years old (2 - PPSV23 if available, else PCV20) 04/12/2022 (Originally 10/29/2013)   HEMOGLOBIN A1C  10/10/2021   FOOT EXAM  05/12/2022   TETANUS/TDAP  05/18/2028   DEXA SCAN  Completed   HPV VACCINES  Aged Out   INFLUENZA VACCINE  Discontinued   Health Maintenance There are no preventive care reminders to display for this patient.  Lung Cancer Screening: (Low Dose CT Chest recommended if Age 26-80 years, 30 pack-year currently smoking OR have quit w/in 15years.) does not qualify.   Hepatitis C Screening: does not qualify  Vision Screening: Recommended annual ophthalmology exams for early detection of glaucoma and other disorders of the eye.  Dental Screening: Recommended annual dental exams for proper oral hygiene  Community Resource Referral / Chronic Care Management: CRR required this visit?  Yes, patient request meals on wheels. Unable to drive to get groceries.Referral sent.   CCM required this visit?  No      Plan:   Keep all routine maintenance appointments.   I have personally reviewed and noted the following in the patient's chart:   Medical and social history Use of alcohol, tobacco or illicit drugs  Current medications and supplements including opioid prescriptions.  Functional ability and status Nutritional status Physical activity Advanced directives List of other physicians Hospitalizations, surgeries, and ER visits in previous 12 months Vitals Screenings to include cognitive, depression, and falls Referrals and appointments  In addition, I have reviewed  and discussed with patient certain preventive protocols, quality metrics, and best practice recommendations. A written personalized care plan for preventive services as well as general preventive health recommendations were provided to patient.     Ashok Pall, LPN   1/61/0960

## 2021-05-18 NOTE — Patient Instructions (Addendum)
?  Cathy Vazquez , ?Thank you for taking time to come for your Medicare Wellness Visit. I appreciate your ongoing commitment to your health goals. Please review the following plan we discussed and let me know if I can assist you in the future.  ? ?These are the goals we discussed: ? Goals   ? ?  Follow up with Primary Care Provider   ?  As needed. ?  ? ?  ?  ?This is a list of the screening recommended for you and due dates:  ?Health Maintenance  ?Topic Date Due  ? COVID-19 Vaccine (1) 05/27/2021*  ? Eye exam for diabetics  06/21/2021*  ? Zoster (Shingles) Vaccine (1 of 2) 07/10/2021*  ? Mammogram  07/14/2021*  ? Pneumonia Vaccine (2 - PPSV23 if available, else PCV20) 04/12/2022*  ? Hemoglobin A1C  10/10/2021  ? Complete foot exam   05/12/2022  ? Tetanus Vaccine  05/18/2028  ? DEXA scan (bone density measurement)  Completed  ? HPV Vaccine  Aged Out  ? Flu Shot  Discontinued  ?*Topic was postponed. The date shown is not the original due date.  ?  ?

## 2021-05-24 ENCOUNTER — Telehealth: Payer: Self-pay

## 2021-05-24 NOTE — Telephone Encounter (Signed)
? ?  Telephone encounter was:  Successful.  ?05/24/2021 ?Name: Cathy Vazquez MRN: [id number] DOB: [birth year] ? ?Cathy Vazquez is a 81 y.o. year old female who is a primary care patient of Einar Pheasant, MD . The community resource team was consulted for assistance with Food Insecurity ? ?Care guide performed the following interventions: Patient provided with information about care guide support team and interviewed to confirm resource needs. Patient stated she wanted assistance getting on the wait list for meals on wheels ? ?Follow Up Plan:  Care guide will follow up with patient by phone over the next day ? ? ? ?Larena Sox ?Care Guide, Embedded Care Coordination ?[place], Care Management  ?[phone] ?[address], [place], [place] [address] ?Phone: [phone] ?Email: [email or web address] ? ?  ?

## 2021-05-25 ENCOUNTER — Telehealth: Payer: Self-pay

## 2021-05-25 NOTE — Telephone Encounter (Signed)
? ?  Telephone encounter was:  Successful.  ?05/25/2021 ?Name: Dabney Dever MRN: [id number] DOB: May 14, 1940 ? ?Tekla Malachowski is a 81 y.o. year old female who is a primary care patient of Einar Pheasant, MD . The community resource team was consulted for assistance with  meals ? ?Care guide performed the following interventions: Patient provided with information about care guide support team and interviewed to confirm resource needs.Patient follow up for meals on wheels wait list and Caro Laroche will reach out today ? ?Follow Up Plan:  No further follow up planned at this time. The patient has been provided with needed resources. ? ? ? ?Larena Sox ?Care Guide, Embedded Care Coordination ?, Care Management  ?[phone] ?[address], [place], [place] [address] ?Phone: [phone] ?Email: [email or web address] ? ?  ?

## 2021-05-29 ENCOUNTER — Ambulatory Visit
Admission: EM | Admit: 2021-05-29 | Discharge: 2021-05-29 | Disposition: A | Discharge status: Home / Self Care | Payer: PPO | Attending: Student | Admitting: Student

## 2021-05-29 ENCOUNTER — Other Ambulatory Visit: Payer: Self-pay

## 2021-05-29 DIAGNOSIS — B349 Viral infection, unspecified: Secondary | ICD-10-CM

## 2021-05-29 DIAGNOSIS — H109 Unspecified conjunctivitis: Secondary | ICD-10-CM | POA: Diagnosis not present

## 2021-05-29 MED ORDER — POLYMYXIN B-TRIMETHOPRIM 10000-0.1 UNIT/ML-% OP SOLN: 1.0000 [drp] | OPHTHALMIC | 0 refills | Status: DC

## 2021-05-29 MED ORDER — BENZONATATE 100 MG PO CAPS
100.0000 mg | ORAL_CAPSULE | Freq: Three times a day (TID) | ORAL | 0 refills | Status: DC
Start: 2021-05-29 — End: 2021-09-19

## 2021-05-29 NOTE — Discharge Instructions (Addendum)
-  You have bacterial conjunctivitis (pinkeye). ?-Start the antibiotic drops, Polytrim 1 drop into the affected eye/s every four hours while awake for 7 days. ?-You can also try warm compresses or washing the face with gentle soap and water to remove crust in the morning. ?-Your symptoms should be a lot better in about 1 day, and you'll also no longer be contagious after 1 day on the antibiotic. ?-If your symptoms are getting worse instead of better, like pain, discharge, itching, vision changes-follow-up with your eye doctor as soon as possible. ?-Do not wear contact lenses until you have completed treatment (7 days!). Wear glasses only. ?-Tessalon (Benzonatate) as needed for cough. Take one pill up to 3x daily (every 8 hours) ?-Follow-up if symptoms worsen instead of improve - new shortness of breath, chest pain, dizziness, weakness.  ?

## 2021-05-29 NOTE — ED Triage Notes (Signed)
Pt present coughing with sore throat and SOB. Symptoms started two days ago. She also complains bilateral swollen in both eyes.  ?

## 2021-05-29 NOTE — ED Provider Notes (Signed)
?[facility] ? ? ? ?CSN: [id number] ?Arrival date & time: 05/29/21  0857 ? ? ?  ? ?History   ?Chief Complaint ?Chief Complaint  ?Patient presents with  ? Cough  ? Sore Throat  ? ? ?HPI ?Cathy Vazquez is a 81 y.o. female presenting viral syndrome x2 days. History wheezing without formal diagnosis pulm ds.  Patient describes cough productive of clear sputum, short throat, bilateral eye irritation with watery drainage and mucopurulent crusting in the morning.  She initially states she is short of breath, but is denying any increase in her baseline dyspnea.  States she has been dyspneic on exertion for the last year.  She does not use any inhalers for this.  She is without current chest pain, shortness of breath, dizziness, weakness, nausea, vomiting, diarrhea.  Has attempted an over-the-counter cough medication with some relief. Does not wear contacts.  Denies eye pain, eye pain with movement, new floaters or flashes of light in field of vision, vision changes, blurred vision. ? ?HPI ? ?Past Medical History:  ?Diagnosis Date  ? Anemia   ? Diverticulosis   ? GERD (gastroesophageal reflux disease)   ? Glaucoma   ? Hypercholesterolemia   ? Hypertension   ? Thyroid nodule   ? ? ?Patient Active Problem List  ? Diagnosis Date Noted  ? B12 deficiency 05/11/2021  ? Hypercholesteremia 04/25/2021  ? Wheezing 04/12/2021  ? Hyponatremia 07/14/2020  ? Anemia 07/14/2020  ? Pre-op evaluation 03/28/2020  ? Leukocytosis 10/27/2019  ? Right hip pain 06/16/2019  ? Skin lesion of right arm 01/01/2019  ? Light headedness 03/25/2018  ? Abdominal pain 02/11/2018  ? CKD (chronic kidney disease) stage 3, GFR 30-59 ml/min (HCC) 09/03/2016  ? Low back pain 05/03/2015  ? Hyperglycemia 12/14/2014  ? Health care maintenance 08/11/2014  ? Lesion of skin of cheek 11/17/2013  ? Knee pain 08/04/2013  ? Right leg pain 03/31/2013  ? Hypertension 02/21/2012  ? Type 2 diabetes mellitus with hyperglycemia ([place]) 02/21/2012  ? Multiple thyroid  nodules 02/21/2012  ? ? ?Past Surgical History:  ?Procedure Laterality Date  ? ABDOMINAL HYSTERECTOMY  1987  ? fibroids, ovaries not removed  ? TONSILLECTOMY    ? ? ?OB History   ?No obstetric history on file. ?  ? ? ? ?Home Medications   ? ?Prior to Admission medications   ?Medication Sig Start Date End Date Taking? Authorizing Provider  ?benzonatate (TESSALON) 100 MG capsule Take 1 capsule (100 mg total) by mouth every 8 (eight) hours. 05/29/21  Yes Hazel Sams, PA-C  ?trimethoprim-polymyxin b (POLYTRIM) ophthalmic solution Place 1 drop into both eyes every 4 (four) hours. 1 drop into both eyes every 4 hours while awake x7 days. 05/29/21  Yes Hazel Sams, PA-C  ?acetaminophen (TYLENOL) 325 MG tablet Take 650 mg by mouth every 4 (four) hours as needed.    [provider]  ?Alpha-Lipoic Acid 200 MG CAPS Take 200 mg by mouth daily.    [provider]  ?Bioflavonoid Products (BIOFLEX PO) Take 1 tablet by mouth daily.     [provider]  ?Continuous Blood Gluc Sensor (FREESTYLE LIBRE 3 SENSOR) MISC 1 applicator by Does not apply route every 14 (fourteen) days. 05/11/21   Einar Pheasant, MD  ?fish oil-omega-3 fatty acids 1000 MG capsule Take 1 capsule (1 g total) by mouth daily. 07/24/20   Einar Pheasant, MD  ?Multiple Vitamin (MULTIVITAMIN) tablet Take 1 tablet by mouth daily.    [provider]  ?  olmesartan (BENICAR) 20 MG tablet TAKE 1 TABLET BY MOUTH EVERY DAY 03/29/21   Einar Pheasant, MD  ? ? ?Family History ?Family History  ?Problem Relation Age of Onset  ? Diabetes Father   ? Hypertension Father   ? Hypercholesterolemia Father   ? Heart disease Father   ?     CABG  ? Hypertension Mother   ? Hypercholesterolemia Mother   ? Rheumatic fever Mother   ? Hypertension Brother   ? Diabetes Sister   ? Breast cancer Maternal Aunt   ? Breast cancer Cousin   ? Colon cancer Neg Hx   ? ? ?Social History ?Social History  ? ?Tobacco Use  ? Smoking status: Never  ? Smokeless tobacco: Never   ?Vaping Use  ? Vaping Use: Never used  ?Substance Use Topics  ? Alcohol use: No  ?  Alcohol/week: 0.0 standard drinks  ? Drug use: No  ? ? ? ?Allergies   ?Norvasc [amlodipine besylate], Contrast media [iodinated contrast media], Penicillins, Codeine, Diclofenac, Felodipine, Lipitor [atorvastatin], Stay awake [caffeine], Zocor [simvastatin], and Benicar [olmesartan] ? ? ?Review of Systems ?Review of Systems  ?Constitutional:  Negative for appetite change, chills and fever.  ?HENT:  Positive for congestion. Negative for ear pain, rhinorrhea, sinus pressure, sinus pain and sore throat.   ?Eyes:  Positive for discharge, redness and itching. Negative for visual disturbance.  ?Respiratory:  Positive for cough. Negative for chest tightness, shortness of breath and wheezing.   ?Cardiovascular:  Negative for chest pain and palpitations.  ?Gastrointestinal:  Negative for abdominal pain, constipation, diarrhea, nausea and vomiting.  ?Genitourinary:  Negative for dysuria, frequency and urgency.  ?Musculoskeletal:  Negative for myalgias.  ?Neurological:  Negative for dizziness, weakness and headaches.  ?Psychiatric/Behavioral:  Negative for confusion.   ?All other systems reviewed and are negative. ? ? ?Physical Exam ?Triage Vital Signs ?ED Triage Vitals  ?Enc Vitals Group  ?   BP 05/29/21 0925 (!) 160/82  ?   Pulse Rate 05/29/21 0925 100  ?   Resp 05/29/21 0925 16  ?   Temp 05/29/21 0925 98.4 ?F (36.9 ?C)  ?   Temp Source 05/29/21 0925 Oral  ?   SpO2 05/29/21 0925 97 %  ?   Weight --   ?   Height --   ?   Head Circumference --   ?   Peak Flow --   ?   Pain Score 05/29/21 0926 5  ?   Pain Loc --   ?   Pain Edu? --   ?   Excl. in [place]? --   ? ?No data found. ? ?Updated Vital Signs ?BP (!) 160/82 (BP Location: Left Arm)   Pulse 100   Temp 98.4 ?F (36.9 ?C) (Oral)   Resp 16   LMP 02/21/1984   SpO2 97%  ? ?Visual Acuity ?Right Eye Distance:   ?Left Eye Distance:   ?Bilateral Distance:   ? ?Right Eye Near:   ?Left Eye Near:     ?Bilateral Near:    ? ?Physical Exam ?Vitals reviewed.  ?Constitutional:   ?   General: She is not in acute distress. ?   Appearance: Normal appearance. She is not ill-appearing.  ?HENT:  ?   Head: Normocephalic and atraumatic.  ?   Right Ear: Tympanic membrane, ear canal and external ear normal. No tenderness. No middle ear effusion. There is no impacted cerumen. Tympanic membrane is not perforated, erythematous, retracted or bulging.  ?   Left Ear:  Tympanic membrane, ear canal and external ear normal. No tenderness.  No middle ear effusion. There is no impacted cerumen. Tympanic membrane is not perforated, erythematous, retracted or bulging.  ?   Nose: Nose normal. No congestion.  ?   Mouth/Throat:  ?   Mouth: Mucous membranes are moist.  ?   Pharynx: Oropharynx is clear. Uvula midline. Posterior oropharyngeal erythema present. No oropharyngeal exudate.  ?   Comments: Mild erythema posterior pharynx. Tonsils and uvula absent. On exam, pharynx is symmetric she is tolerating her secretions without difficulty, there is no trismus, no drooling, she has normal phonation ? ?Eyes:  ?   General: Lids are normal. Lids are everted, no foreign bodies appreciated. Vision grossly intact. Gaze aligned appropriately. No visual field deficit.    ?   Right eye: No foreign body, discharge or hordeolum.     ?   Left eye: No foreign body, discharge or hordeolum.  ?   Extraocular Movements: Extraocular movements intact.  ?   Right eye: Normal extraocular motion and no nystagmus.  ?   Left eye: Normal extraocular motion and no nystagmus.  ?   Conjunctiva/sclera:  ?   Right eye: Right conjunctiva is injected. No chemosis, exudate or hemorrhage. ?   Left eye: Left conjunctiva is injected. No chemosis, exudate or hemorrhage. ?   Pupils: Pupils are equal, round, and reactive to light.  ?   Visual Fields: Right eye visual fields normal and left eye visual fields normal.  ?   Comments: Bilateral conjunctival injection and watering. No  exudate on exam. No chemosis, hemorrhage, foreign body, lid changes. PERRLA, EOMI without pain. No orbital tenderness. Visual acuity intact.   ?Cardiovascular:  ?   Rate and Rhythm: Normal rate and regular rhythm.

## 2021-05-31 ENCOUNTER — Other Ambulatory Visit: Payer: Self-pay | Admitting: Internal Medicine

## 2021-06-03 ENCOUNTER — Ambulatory Visit (INDEPENDENT_AMBULATORY_CARE_PROVIDER_SITE_OTHER): Payer: PPO | Admitting: Internal Medicine

## 2021-06-03 ENCOUNTER — Encounter: Payer: Self-pay | Admitting: Internal Medicine

## 2021-06-03 VITALS — BP 150/70 | HR 78 | Temp 98.9°F | Resp 14 | Ht 63.0 in | Wt 181.0 lb

## 2021-06-03 DIAGNOSIS — Z5982 Transportation insecurity: Secondary | ICD-10-CM | POA: Diagnosis not present

## 2021-06-03 DIAGNOSIS — R053 Chronic cough: Secondary | ICD-10-CM

## 2021-06-03 MED ORDER — AZITHROMYCIN 250 MG PO TABS: ORAL_TABLET | ORAL | 0 refills | Status: AC

## 2021-06-03 NOTE — Patient Instructions (Addendum)
Dial a Ride  ?[place]  ? ?Avoid decongestants  ? ?Consider over the counter allergy pill allegra, claritin, zyrtec, or xyzal at night  ?If making you too sleepy cut the pill in 1/2 pill at night  ? ?Consider CT chest if cough continues  ? ?  ?These are over the counter medication options:  ?Mucinex dm green label for cough or robitussin DM  ?Multivitamin or below vitamins  ?Vitamin C 1000 mg daily.  ?Vitamin D3 4000 Iu (units) daily.  ?Zinc 100 mg daily.  ?Quercetin 250-500 mg 2 times per day   ?Elderberry  ?Oil of oregano  ?cepacol or chloroseptic spray ?Warm salt water gargles +hydrogen peroxide ?Sugar free cough drops  ?Warm tea with honey and lemon  ?Hydration  ?Try to eat though you dont feel like it   ?Tylenol fever ?Nasal saline and Flonase or nasacort 2 sprays nasal congestion  ?.  ?   ? ?Cough, Adult ?Coughing is a reflex that clears your throat and your airways (respiratory system). Coughing helps to heal and protect your lungs. It is normal to cough occasionally, but a cough that happens with other symptoms or lasts a long time may be a sign of a condition that needs treatment. An acute cough may only last 2-3 weeks, while a chronic cough may last 8 or more weeks. ?Coughing is commonly caused by: ?Infection of the respiratory systemby viruses or bacteria. ?Breathing in substances that irritate your lungs. ?Allergies. ?Asthma. ?Mucus that runs down the back of your throat (postnasal drip). ?Smoking. ?Acid backing up from the stomach into the esophagus (gastroesophageal reflux). ?Certain medicines. ?Chronic lung problems. ?Other medical conditions such as heart failure or a blood clot in the lung (pulmonary embolism). ?Follow these instructions at home: ?Medicines ?Take over-the-counter and prescription medicines only as told by your health care provider. ?Talk with your health care provider before you take a cough suppressant medicine. ?Lifestyle ? ?Avoid cigarette smoke. Do not use any products that  contain nicotine or tobacco, such as cigarettes, e-cigarettes, and chewing tobacco. If you need help quitting, ask your health care provider. ?Drink enough fluid to keep your urine pale yellow. ?Avoid caffeine. ?Do not drink alcohol if your health care provider tells you not to drink. ?General instructions ? ?Pay close attention to changes in your cough. Tell your health care provider about them. ?Always cover your mouth when you cough. ?Avoid things that make you cough, such as perfume, candles, cleaning products, or campfire or tobacco smoke. ?If the air is dry, use a cool mist vaporizer or humidifier in your bedroom or your home to help loosen secretions. ?If your cough is worse at night, try to sleep in a semi-upright position. ?Rest as needed. ?Keep all follow-up visits as told by your health care provider. This is important. ?Contact a health care provider if you: ?Have new symptoms. ?Cough up pus. ?Have a cough that does not get better after 2-3 weeks or gets worse. ?Cannot control your cough with cough suppressant medicines and you are losing sleep. ?Have pain that gets worse or pain that is not helped with medicine. ?Have a fever. ?Have unexplained weight loss. ?Have night sweats. ?Get help right away if: ?You cough up blood. ?You have difficulty breathing. ?Your heartbeat is very fast. ?These symptoms may represent a serious problem that is an emergency. Do not wait to see if the symptoms will go away. Get medical help right away. Call your local emergency services (911 in the [place] Do  not drive yourself to the hospital. ?Summary ?Coughing is a reflex that clears your throat and your airways. It is normal to cough occasionally, but a cough that happens with other symptoms or lasts a long time may be a sign of a condition that needs treatment. ?Take over-the-counter and prescription medicines only as told by your health care provider. ?Always cover your mouth when you cough. ?Contact a health care provider  if you have new symptoms or a cough that does not get better after 2-3 weeks or gets worse. ?This information is not intended to replace advice given to you by your health care provider. Make sure you discuss any questions you have with your health care provider. ?Document Revised: 02/26/2018 Document Reviewed: 02/26/2018 ?Elsevier Patient Education ? [place]. ? ?

## 2021-06-03 NOTE — Progress Notes (Signed)
Chief Complaint  ?Patient presents with  ? Acute Visit  ?  Pt c/o cough which is getting slightly better, worse yesterday. Seen by UC on Saturday for same issue was given tessalon pearls w/o relief, switched to robutussin finished almost 1 bottle. Cough is dry denies sore throat.   ? ?F/u with daughter Mitzi ?1. Cough seen UC 05/29/21 given tessalon perlse 100 mg tid prn w/o relief. Has robitussin dm which she just started. No fever, has white phelgm, never smoking, she was wheezing but resolved. Voice is hoarse and +PND ?2. Transportation issues daughter Mitzi moving back from [facility] but drove here to be with her today  ? ? ?Review of Systems  ?Constitutional:  Negative for weight loss.  ?HENT:  Negative for hearing loss.   ?Eyes:  Negative for blurred vision.  ?Respiratory:  Positive for cough. Negative for shortness of breath and wheezing.   ?Cardiovascular:  Negative for chest pain.  ?Gastrointestinal:  Negative for abdominal pain and blood in stool.  ?Genitourinary:  Negative for dysuria.  ?Musculoskeletal:  Negative for falls and joint pain.  ?Skin:  Negative for rash.  ?Neurological:  Negative for headaches.  ?Psychiatric/Behavioral:  Negative for depression.   ?Past Medical History:  ?Diagnosis Date  ? Anemia   ? Diverticulosis   ? GERD (gastroesophageal reflux disease)   ? Glaucoma   ? Hypercholesterolemia   ? Hypertension   ? Thyroid nodule   ? ?Past Surgical History:  ?Procedure Laterality Date  ? ABDOMINAL HYSTERECTOMY  1987  ? fibroids, ovaries not removed  ? TONSILLECTOMY    ? ?Family History  ?Problem Relation Age of Onset  ? Diabetes Father   ? Hypertension Father   ? Hypercholesterolemia Father   ? Heart disease Father   ?     CABG  ? Hypertension Mother   ? Hypercholesterolemia Mother   ? Rheumatic fever Mother   ? Hypertension Brother   ? Diabetes Sister   ? Breast cancer Maternal Aunt   ? Breast cancer Cousin   ? Colon cancer Neg Hx   ? ?Social History  ? ?Socioeconomic History  ? Marital  status: Divorced  ?  Spouse name: Not on file  ? Number of children: 2  ? Years of education: Not on file  ? Highest education level: Not on file  ?Occupational History  ? Not on file  ?Tobacco Use  ? Smoking status: Never  ? Smokeless tobacco: Never  ?Vaping Use  ? Vaping Use: Never used  ?Substance and Sexual Activity  ? Alcohol use: No  ?  Alcohol/week: 0.0 standard drinks  ? Drug use: No  ? Sexual activity: Not Currently  ?Other Topics Concern  ? Not on file  ?Social History Narrative  ? Not on file  ? ?Social Determinants of Health  ? ?Financial Resource Strain: Low Risk   ? Difficulty of Paying Living Expenses: Not hard at all  ?Food Insecurity: No Food Insecurity  ? Worried About Charity fundraiser in the Last Year: Never true  ? Ran Out of Food in the Last Year: Never true  ?Transportation Needs: No Transportation Needs  ? Lack of Transportation (Medical): No  ? Lack of Transportation (Non-Medical): No  ?Physical Activity: Not on file  ?Stress: Not on file  ?Social Connections: Unknown  ? Frequency of Communication with Friends and Family: More than three times a week  ? Frequency of Social Gatherings with Friends and Family: More than three times a week  ?  Attends Religious Services: Not on file  ? Active Member of Clubs or Organizations: Not on file  ? Attends Archivist Meetings: Not on file  ? Marital Status: Not on file  ?Intimate Partner Violence: Not At Risk  ? Fear of Current or Ex-Partner: No  ? Emotionally Abused: No  ? Physically Abused: No  ? Sexually Abused: No  ? ?Current Meds  ?Medication Sig  ? acetaminophen (TYLENOL) 325 MG tablet Take 650 mg by mouth every 4 (four) hours as needed.  ? Alpha-Lipoic Acid 200 MG CAPS Take 200 mg by mouth daily.  ? azithromycin (ZITHROMAX) 250 MG tablet With food Take 2 tablets on day 1, then 1 tablet daily on days 2 through 5  ? Bioflavonoid Products (BIOFLEX PO) Take 1 tablet by mouth daily.   ? Continuous Blood Gluc Sensor (FREESTYLE LIBRE 3  SENSOR) MISC 1 applicator by Does not apply route every 14 (fourteen) days.  ? fish oil-omega-3 fatty acids 1000 MG capsule Take 1 capsule (1 g total) by mouth daily.  ? Multiple Vitamin (MULTIVITAMIN) tablet Take 1 tablet by mouth daily.  ? olmesartan (BENICAR) 20 MG tablet TAKE 1 TABLET BY MOUTH EVERY DAY  ? trimethoprim-polymyxin b (POLYTRIM) ophthalmic solution Place 1 drop into both eyes every 4 (four) hours. 1 drop into both eyes every 4 hours while awake x7 days.  ? ?Current Facility-Administered Medications for the 06/03/21 encounter (Office Visit) with McLean-Scocuzza, Nino Glow, MD  ?Medication  ? cyanocobalamin ((VITAMIN B-12)) injection 1,000 mcg  ? cyanocobalamin ((VITAMIN B-12)) injection 1,000 mcg  ? ?Allergies  ?Allergen Reactions  ? Norvasc [Amlodipine Besylate] Other (See Comments)  ?  Headache ?  ? Contrast Media [Iodinated Contrast Media]   ?  Tingling sensation  ? Penicillins Nausea And Vomiting  ? Codeine   ? Diclofenac Other (See Comments)  ? Felodipine Other (See Comments)  ?  Headache ?  ? Lipitor [Atorvastatin]   ? Stay Awake [Caffeine]   ? Zocor [Simvastatin]   ? Benicar [Olmesartan]   ?  Has been taking without issues  ? ?Recent Results (from the past 2160 hour(s))  ?CBC with Differential/Platelet     Status: Abnormal  ? Collection Time: 04/12/21  2:50 PM  ?Result Value Ref Range  ? WBC 14.6 (H) 4.0 - 10.5 K/uL  ? RBC 4.32 3.87 - 5.11 Mil/uL  ? Hemoglobin 12.9 12.0 - 15.0 g/dL  ? HCT 39.2 36.0 - 46.0 %  ? MCV 90.7 78.0 - 100.0 fl  ? MCHC 33.0 30.0 - 36.0 g/dL  ? RDW 16.4 (H) 11.5 - 15.5 %  ? Platelets 288.0 150.0 - 400.0 K/uL  ? Neutrophils Relative % 71.9 43.0 - 77.0 %  ? Lymphocytes Relative 19.5 12.0 - 46.0 %  ? Monocytes Relative 6.3 3.0 - 12.0 %  ? Eosinophils Relative 1.3 0.0 - 5.0 %  ? Basophils Relative 1.0 0.0 - 3.0 %  ? Neutro Abs 10.5 (H) 1.4 - 7.7 K/uL  ? Lymphs Abs 2.8 0.7 - 4.0 K/uL  ? Monocytes Absolute 0.9 0.1 - 1.0 K/uL  ? Eosinophils Absolute 0.2 0.0 - 0.7 K/uL  ? Basophils  Absolute 0.1 0.0 - 0.1 K/uL  ?Ferritin     Status: None  ? Collection Time: 04/12/21  2:50 PM  ?Result Value Ref Range  ? Ferritin 114.5 10.0 - 291.0 ng/mL  ?Lipid panel     Status: Abnormal  ? Collection Time: 04/12/21  2:50 PM  ?Result Value Ref Range  ?  Cholesterol 232 (H) 0 - 200 mg/dL  ?  Comment: ATP III Classification       Desirable:  < 200 mg/dL               Borderline High:  200 - 239 mg/dL          High:  > = 240 mg/dL  ? Triglycerides 385.0 (H) 0.0 - 149.0 mg/dL  ?  Comment: Normal:  <150 mg/dLBorderline High:  150 - 199 mg/dL  ? HDL 42.90 >39.00 mg/dL  ? VLDL 77.0 (H) 0.0 - 40.0 mg/dL  ? Total CHOL/HDL Ratio 5   ?  Comment:                Men          Women1/2 Average Risk     3.4          3.3Average Risk          5.0          4.42X Average Risk          9.6          7.13X Average Risk          15.0          11.0                      ? NonHDL 189.38   ?  Comment: NOTE:  Non-HDL goal should be 30 mg/dL higher than patient's LDL goal (i.e. LDL goal of < 70 mg/dL, would have non-HDL goal of < 100 mg/dL)  ?Hepatic function panel     Status: None  ? Collection Time: 04/12/21  2:50 PM  ?Result Value Ref Range  ? Total Bilirubin 0.5 0.2 - 1.2 mg/dL  ? Bilirubin, Direct 0.1 0.0 - 0.3 mg/dL  ? Alkaline Phosphatase 84 39 - 117 U/L  ? AST 16 0 - 37 U/L  ? ALT 20 0 - 35 U/L  ? Total Protein 7.3 6.0 - 8.3 g/dL  ? Albumin 4.4 3.5 - 5.2 g/dL  ?Hemoglobin A1c     Status: Abnormal  ? Collection Time: 04/12/21  2:50 PM  ?Result Value Ref Range  ? Hgb A1c MFr Bld 7.8 (H) 4.6 - 6.5 %  ?  Comment: Glycemic Control Guidelines for People with Diabetes:Non Diabetic:  <6%Goal of Therapy: <7%Additional Action Suggested:  >8%   ?Basic metabolic panel     Status: Abnormal  ? Collection Time: 04/12/21  2:50 PM  ?Result Value Ref Range  ? Sodium 134 (L) 135 - 145 mEq/L  ? Potassium 4.7 3.5 - 5.1 mEq/L  ? Chloride 97 96 - 112 mEq/L  ? CO2 31 19 - 32 mEq/L  ? Glucose, Bld 215 (H) 70 - 99 mg/dL  ? BUN 24 (H) 6 - 23 mg/dL  ?  Creatinine, Ser 1.14 0.40 - 1.20 mg/dL  ? GFR 44.78 (L) >60.00 mL/min  ?  Comment: Calculated using the CKD-EPI Creatinine Equation (2021)  ? Calcium 10.0 8.4 - 10.5 mg/dL  ?LDL cholesterol, direct     Status: None

## 2021-06-14 ENCOUNTER — Telehealth: Payer: Self-pay | Admitting: Internal Medicine

## 2021-06-14 DIAGNOSIS — E1165 Type 2 diabetes mellitus with hyperglycemia: Secondary | ICD-10-CM

## 2021-06-14 MED ORDER — FREESTYLE LIBRE 2 SENSOR MISC: 1.0000 "application " | 5 refills | Status: DC

## 2021-06-14 MED ORDER — FREESTYLE LIBRE 2 READER DEVI: 1.0000 "application " | 0 refills | Status: DC

## 2021-06-14 NOTE — Telephone Encounter (Signed)
Patient called and wanted to speak to [facility] again. Please call her. ?

## 2021-06-14 NOTE — Telephone Encounter (Signed)
Spoke with patient and advised her that she and her grandson can use [employer] and view the [place] sensor 3 video on how to setup or we could schedule nurse visit for teaching. Patient grandson advised her they could watch the video to gether and he could assist in setting up the app. Will callback for nurse visit if needed for teaching. ?

## 2021-06-14 NOTE — Telephone Encounter (Signed)
Pt would like to be called concerning her libre sensor [phone] ?

## 2021-06-14 NOTE — Telephone Encounter (Signed)
Patient could not [place] eLibre 3 due to phone to old to down load app. Sent in meter for [place] 2 and script for sensors.  ?

## 2021-06-16 ENCOUNTER — Telehealth: Payer: Self-pay

## 2021-06-16 NOTE — Telephone Encounter (Signed)
? ?  Telephone encounter was:  Unsuccessful.  06/16/2021 ?Name: Cathy Vazquez MRN: [id number] DOB: [birth year] ? ?Unsuccessful outbound call made today to assist with:  Transportation Needs  ? ?Outreach Attempt:  1st Attempt ? ?A HIPAA compliant voice message was left requesting a return call.  Instructed patient to call back at [phone]. Left message on voicemail for patient's daughter to return my call regarding transportation for the patient. Spoke to [place] at [employer] she will put call the patient's daughter arrange a time for them to come to the home and make sure the [name] Lei can safely pick the patient up and turn around.   ? ?Melinna Linarez, [place], [place] ?Care Guide  Embedded Care Coordination ?[facility]  ?[address] ?[place], [place] [address] ???[email or web address]  ?? [phone]   ?[email or web address] ?  ?

## 2021-06-17 NOTE — Telephone Encounter (Signed)
Placed Libre reader and two sensors at front desk for pickup. ?

## 2021-06-17 NOTE — Telephone Encounter (Signed)
Pt stated that her Cathy Vazquez has not came in yet ?

## 2021-06-17 NOTE — Telephone Encounter (Signed)
Left message to call office

## 2021-06-23 ENCOUNTER — Telehealth: Payer: Self-pay

## 2021-06-23 NOTE — Telephone Encounter (Signed)
? ?  Telephone encounter was:  Successful.  ?06/23/2021 ?Name: Cathy Vazquez MRN: [id number] DOB: 1940/12/09 ? ?Cathy Vazquez is a 81 y.o. year old female who is a primary care patient of Einar Pheasant, MD . The community resource team was consulted for assistance with Transportation Needs  ? ?Care guide performed the following interventions: Spoke with patient about [place] transportation and gave her the number to call to schedule transportation.  Called Sharyn Lull at [employer] gave patient demographic information to enter patient into the system.  Cathy Vazquez will be called this week.   ? ?Follow Up Plan:  Care guide will follow up with patient by phone over the next 2 days ? ?Cathy Vazquez, [place], CHC ?Care Guide  Embedded Care Coordination ?[facility]  ?[address] ?[place], [place] [address] ???[email or web address]  ?? [phone]   ?[email or web address] ?  ?

## 2021-06-23 NOTE — Telephone Encounter (Signed)
S/w pt - had questions regarding if she should be recording her blood sugars or not. ?I advised pt to keep log of readings and bring with her when she comes in. ?Pt also had questions regarding diet and if she needs to be watching what she eats. ?Pt stated she had a reading of 275 yesterday , and wondered if it was due to her food. ?I advised pt to watch carb and sugar intake. ?Be mindful of her intake of carbs like breads, pastas, potatoes, rice. ?Also try to cut out cakes, candies, cookies, etc.  ? ?Any other recommendations ? ?

## 2021-06-23 NOTE — Telephone Encounter (Signed)
Pt called in requesting for callback... Pt stated that she would like to talk about her libre sensor... Pt requesting callback ?

## 2021-06-24 NOTE — Telephone Encounter (Signed)
S/w pt - stated will wait until sched appt 5/25 to discuss ?

## 2021-06-24 NOTE — Telephone Encounter (Signed)
Agree with recommendation to monitor carb intake and check blood sugars.  Last a1c elevated.  We had discussed [facility] referral for diabetes education/diet instruction and had discussed started medication.  She had wanted to hold on starting.  Does need to be checking sugars.  Given elevated blood sugar, needs medication.  Can schedule an appt to discuss.   ?

## 2021-06-25 ENCOUNTER — Telehealth: Payer: Self-pay

## 2021-06-25 NOTE — Telephone Encounter (Signed)
? ?  Telephone encounter was:  Unsuccessful.  06/25/2021 ?Name: Cathy Vazquez MRN: [id number] DOB: 06-08-1940 ? ?Unsuccessful outbound call made today to assist with:  Transportation Needs  ? ?Outreach Attempt:  3rd Attempt.  Referral closed unable to contact patient. ? ?A HIPAA compliant voice message was left requesting a return call.  Instructed patient to call back at [phone]. Left message on  voicemail for patient to return my call regarding transportation with Cathy Vazquez.  Left message to follow up informed patient I would be out of the office next week, she can leave a message and I will follow up when I return May 15. She has the number for [place] transportation [phone] as well. ? ?Cathy Vazquez, [place], [place] ?Care Guide  Embedded Care Coordination ?[facility]  ?[address] ?[place], [place] [address] ???[email or web address]  ?? [phone]   ?[email or web address] ?  ?

## 2021-06-30 ENCOUNTER — Other Ambulatory Visit: Payer: Self-pay | Admitting: Internal Medicine

## 2021-06-30 NOTE — Telephone Encounter (Signed)
Patient call and does not need the Protonix refilled, she states she has plenty. She does need a refill on her olmesartan (BENICAR) 20 MG tablet. ?

## 2021-07-08 ENCOUNTER — Other Ambulatory Visit: Payer: Self-pay

## 2021-07-08 DIAGNOSIS — E1165 Type 2 diabetes mellitus with hyperglycemia: Secondary | ICD-10-CM

## 2021-07-08 MED ORDER — FREESTYLE LIBRE 2 SENSOR MISC
1.0000 "application " | 5 refills | Status: DC
  Filled 2021-07-08: qty 2, 30d supply, fill #0

## 2021-07-08 MED ORDER — FREESTYLE LIBRE 2 READER DEVI: 1.0000 "application " | 5 refills | Status: DC

## 2021-07-08 NOTE — Telephone Encounter (Signed)
Pt called in stating that her sensor on her arm have came off... Pt stated that th reader is telling her to put another sensor on... Pt stated that she need another sensor... Pt requesting callback

## 2021-07-08 NOTE — Telephone Encounter (Signed)
Noted.  Please update pt.

## 2021-07-08 NOTE — Telephone Encounter (Signed)
S/w pt - uses Libre 2 - only have [place] 3 sensors in office. Attempted to speak w/ pharmacist at [facility] about what the hold up is with delivering her sensors. Pharm closed for lunch - left message on prescriber voicemail. Also sent rx to [facility] in case it is a coverage problem, it can be looked into.

## 2021-07-15 ENCOUNTER — Other Ambulatory Visit (INDEPENDENT_AMBULATORY_CARE_PROVIDER_SITE_OTHER): Payer: PPO

## 2021-07-15 DIAGNOSIS — E7800 Pure hypercholesterolemia, unspecified: Secondary | ICD-10-CM | POA: Diagnosis not present

## 2021-07-15 DIAGNOSIS — E1165 Type 2 diabetes mellitus with hyperglycemia: Secondary | ICD-10-CM | POA: Diagnosis not present

## 2021-07-15 DIAGNOSIS — D72829 Elevated white blood cell count, unspecified: Secondary | ICD-10-CM | POA: Diagnosis not present

## 2021-07-15 LAB — CBC WITH DIFFERENTIAL/PLATELET
Basophils Absolute: 0.1 10*3/uL (ref 0.0–0.1)
Basophils Relative: 0.5 % (ref 0.0–3.0)
Eosinophils Absolute: 0.2 10*3/uL (ref 0.0–0.7)
Eosinophils Relative: 2 % (ref 0.0–5.0)
HCT: 38 % (ref 36.0–46.0)
Hemoglobin: 12.6 g/dL (ref 12.0–15.0)
Lymphocytes Relative: 21.3 % (ref 12.0–46.0)
Lymphs Abs: 2.5 10*3/uL (ref 0.7–4.0)
MCHC: 33.2 g/dL (ref 30.0–36.0)
MCV: 89.4 fl (ref 78.0–100.0)
Monocytes Absolute: 0.8 10*3/uL (ref 0.1–1.0)
Monocytes Relative: 6.4 % (ref 3.0–12.0)
Neutro Abs: 8.3 10*3/uL — ABNORMAL HIGH (ref 1.4–7.7)
Neutrophils Relative %: 69.8 % (ref 43.0–77.0)
Platelets: 259 10*3/uL (ref 150.0–400.0)
RBC: 4.25 Mil/uL (ref 3.87–5.11)
RDW: 16.1 % — ABNORMAL HIGH (ref 11.5–15.5)
WBC: 11.9 10*3/uL — ABNORMAL HIGH (ref 4.0–10.5)

## 2021-07-15 LAB — BASIC METABOLIC PANEL
BUN: 28 mg/dL — ABNORMAL HIGH (ref 6–23)
CO2: 28 mEq/L (ref 19–32)
Calcium: 9.8 mg/dL (ref 8.4–10.5)
Chloride: 101 mEq/L (ref 96–112)
Creatinine, Ser: 1.02 mg/dL (ref 0.40–1.20)
GFR: 51.09 mL/min — ABNORMAL LOW (ref >60.00)
Glucose, Bld: 180 mg/dL — ABNORMAL HIGH (ref 70–99)
Potassium: 4.9 mEq/L (ref 3.5–5.1)
Sodium: 140 mEq/L (ref 135–145)

## 2021-07-15 LAB — TSH: TSH: 3.34 u[IU]/mL (ref 0.35–5.50)

## 2021-07-15 LAB — HEPATIC FUNCTION PANEL
ALT: 17 U/L (ref 0–35)
AST: 13 U/L (ref 0–37)
Albumin: 4.3 g/dL (ref 3.5–5.2)
Alkaline Phosphatase: 79 U/L (ref 39–117)
Bilirubin, Direct: 0.1 mg/dL (ref 0.0–0.3)
Total Bilirubin: 0.7 mg/dL (ref 0.2–1.2)
Total Protein: 7.2 g/dL (ref 6.0–8.3)

## 2021-07-15 LAB — LIPID PANEL
Cholesterol: 191 mg/dL (ref 0–200)
HDL: 36.2 mg/dL — ABNORMAL LOW (ref >39.00)
NonHDL: 154.41
Total CHOL/HDL Ratio: 5
Triglycerides: 206 mg/dL — ABNORMAL HIGH (ref 0.0–149.0)
VLDL: 41.2 mg/dL — ABNORMAL HIGH (ref 0.0–40.0)

## 2021-07-15 LAB — LDL CHOLESTEROL, DIRECT: Direct LDL: 130 mg/dL

## 2021-07-15 LAB — HEMOGLOBIN A1C: Hgb A1c MFr Bld: 7.7 % — ABNORMAL HIGH (ref 4.6–6.5)

## 2021-07-15 NOTE — Addendum Note (Signed)
Addended by: Leeanne Rio on: 07/15/2021 10:52 AM   Modules accepted: Orders

## 2021-07-16 ENCOUNTER — Other Ambulatory Visit: Payer: Self-pay

## 2021-07-16 MED ORDER — OLMESARTAN MEDOXOMIL 20 MG PO TABS: 20.0000 mg | ORAL_TABLET | Freq: Every day | ORAL | 1 refills | Status: DC

## 2021-07-21 ENCOUNTER — Ambulatory Visit: Payer: PPO | Admitting: Internal Medicine

## 2021-07-27 ENCOUNTER — Ambulatory Visit: Payer: PPO | Admitting: Internal Medicine

## 2021-07-29 ENCOUNTER — Other Ambulatory Visit: Payer: Self-pay | Admitting: *Deleted

## 2021-07-29 ENCOUNTER — Ambulatory Visit (INDEPENDENT_AMBULATORY_CARE_PROVIDER_SITE_OTHER): Payer: PPO

## 2021-07-29 DIAGNOSIS — E1165 Type 2 diabetes mellitus with hyperglycemia: Secondary | ICD-10-CM

## 2021-07-29 DIAGNOSIS — E538 Deficiency of other specified B group vitamins: Secondary | ICD-10-CM

## 2021-07-29 MED ORDER — CYANOCOBALAMIN 1000 MCG/ML IJ SOLN
1000.0000 ug | Freq: Once | INTRAMUSCULAR | Status: AC
  Administered 2021-07-29: 1000 ug via INTRAMUSCULAR

## 2021-07-29 NOTE — Addendum Note (Signed)
Addended by: Leeanne Rio on: 07/29/2021 04:46 PM   Modules accepted: Orders

## 2021-07-29 NOTE — Progress Notes (Signed)
Pt presented today for b12 injection. Left deltoid, IM. Pt voiced no concerns nor showed any signs of distress during injection. 

## 2021-07-30 ENCOUNTER — Other Ambulatory Visit: Payer: Self-pay

## 2021-07-30 LAB — MICROALBUMIN / CREATININE URINE RATIO
Creatinine,U: 118.8 mg/dL
Microalb Creat Ratio: 2.8 mg/g (ref 0.0–30.0)
Microalb, Ur: 3.3 mg/dL — ABNORMAL HIGH (ref 0.0–1.9)

## 2021-08-30 ENCOUNTER — Ambulatory Visit (INDEPENDENT_AMBULATORY_CARE_PROVIDER_SITE_OTHER): Payer: PPO

## 2021-08-30 DIAGNOSIS — E538 Deficiency of other specified B group vitamins: Secondary | ICD-10-CM | POA: Diagnosis not present

## 2021-08-30 MED ORDER — CYANOCOBALAMIN 1000 MCG/ML IJ SOLN
1000.0000 ug | Freq: Once | INTRAMUSCULAR | Status: AC
  Administered 2021-08-30: 1000 ug via INTRAMUSCULAR

## 2021-08-30 NOTE — Progress Notes (Signed)
Pt arrived for B12 injection, given in R deltoid. Pt tolerated injection well, showed no signs of distress nor voiced any concerns.  

## 2021-09-06 ENCOUNTER — Other Ambulatory Visit: Payer: Self-pay

## 2021-09-06 ENCOUNTER — Telehealth: Payer: Self-pay | Admitting: Internal Medicine

## 2021-09-06 ENCOUNTER — Ambulatory Visit
Admission: EM | Admit: 2021-09-06 | Discharge: 2021-09-06 | Disposition: A | Discharge status: Home / Self Care | Payer: PPO

## 2021-09-06 DIAGNOSIS — M7989 Other specified soft tissue disorders: Secondary | ICD-10-CM

## 2021-09-06 DIAGNOSIS — I83891 Varicose veins of right lower extremities with other complications: Secondary | ICD-10-CM | POA: Diagnosis not present

## 2021-09-06 NOTE — Telephone Encounter (Signed)
Patient states she is having trouble getting a ride to her appointment with [place] tomorrow.  Patient states she did go to the urgent care today and they gave her some compression socks.  Patient states they are doing good and she thinks she will be ok to wait until Wednesday, 09/08/2021, to see [place].  I rescheduled her appointment for 09/08/2021.  Patient states she will see if she can find transportation for her new appointment date and time, if not, she will call us.

## 2021-09-06 NOTE — Telephone Encounter (Signed)
S/w pt - advised not to wait until tomorrow, to go to [facility] today.  Pt stated her granddaughter had already stopped over, and told pt that its just fluid and looks fine. Pt stated her granddaughter said there should be no problem with waiting until tomorrow.  I advised pt that our recommendation is that she is evaluated today due to her symptoms of numbness , tightness, redness, increasing swelling. It is not recommended to wait another 24 hours to be evaluated, may not be safe.  Pt gave verbal agreement to have someone take her over to [facility] urgent care today.

## 2021-09-06 NOTE — Telephone Encounter (Signed)
Patient called and stated her rt foot is swollen and leg hurts, no hot spots, no redness or bruising. No appointments available. Patient transferred to Access Nurse. Appointment made with Dr Aundra Dubin on 09/07/2021 @ 1:20pm.

## 2021-09-06 NOTE — ED Provider Notes (Signed)
[facility]    CSN: [id number] Arrival date & time: 09/06/21  1236      History   Chief Complaint Chief Complaint  Patient presents with   Joint Swelling    HPI Cathy Vazquez is a 81 y.o. female presenting for 1 year history of intermittent right lower leg swelling and discomfort.  Patient reports over the past week and especially the past day she has noticed more swelling of her right ankle.  She denies any injuries.  She denies any significant pain.  Denies any problems with walking.  She has not had any bruising or redness.  She does report that icing and elevating her lower extremity helps take the swelling down.  She reports that she has a chair lift and recently that has been broken so her feet up and dangling.  She reports she has an appointment with her doctor's office tomorrow but they became concerned because she reported worsening swelling so they wanted her to be seen sooner.  She does not have any open wounds and has not had any fevers.  She has no history of DVT or PE.  Patient reports the symptoms all started after she had right hip surgery 1 year ago.  She also does have varicose veins.  No history of CHF.  History of stage III CKD.  Other history includes hypertension, type 2 diabetes, hyperlipidemia, GERD and thyroid nodules.  HPI  Past Medical History:  Diagnosis Date   Anemia    Diverticulosis    GERD (gastroesophageal reflux disease)    Glaucoma    Hypercholesterolemia    Hypertension    Thyroid nodule     Patient Active Problem List   Diagnosis Date Noted   B12 deficiency 05/11/2021   Hypercholesteremia 04/25/2021   Wheezing 04/12/2021   Hyponatremia 07/14/2020   Anemia 07/14/2020   Pre-op evaluation 03/28/2020   Leukocytosis 10/27/2019   Right hip pain 06/16/2019   Skin lesion of right arm 01/01/2019   Light headedness 03/25/2018   Abdominal pain 02/11/2018   CKD (chronic kidney disease) stage 3, GFR 30-59 ml/min (HCC) 09/03/2016    Low back pain 05/03/2015   Hyperglycemia 12/14/2014   Health care maintenance 08/11/2014   Lesion of skin of cheek 11/17/2013   Knee pain 08/04/2013   Right leg pain 03/31/2013   Hypertension 02/21/2012   Type 2 diabetes mellitus with hyperglycemia ([place]) 02/21/2012   Multiple thyroid nodules 02/21/2012    Past Surgical History:  Procedure Laterality Date   ABDOMINAL HYSTERECTOMY  1987   fibroids, ovaries not removed   TONSILLECTOMY      OB History   No obstetric history on file.      Home Medications    Prior to Admission medications   Medication Sig Start Date End Date Taking? Authorizing Provider  acetaminophen (TYLENOL) 325 MG tablet Take 650 mg by mouth every 4 (four) hours as needed.    [provider]  Alpha-Lipoic Acid 200 MG CAPS Take 200 mg by mouth daily.    [provider]  benzonatate (TESSALON) 100 MG capsule Take 1 capsule (100 mg total) by mouth every 8 (eight) hours. Patient not taking: Reported on 06/03/2021 05/29/21   Hazel Sams, PA-C  Bioflavonoid Products (BIOFLEX PO) Take 1 tablet by mouth daily.     [provider]  Continuous Blood Gluc Receiver (FREESTYLE LIBRE 2 READER) DEVI 1 application. by Does not apply route every 14 (fourteen) days. 07/08/21   Einar Pheasant, MD  Continuous Blood Gluc Sensor (FREESTYLE LIBRE 2 SENSOR) MISC 1 application. by Does not apply route every 14 (fourteen) days. 07/08/21   Einar Pheasant, MD  fish oil-omega-3 fatty acids 1000 MG capsule Take 1 capsule (1 g total) by mouth daily. 07/24/20   Einar Pheasant, MD  Multiple Vitamin (MULTIVITAMIN) tablet Take 1 tablet by mouth daily.    [provider]  olmesartan (BENICAR) 20 MG tablet Take 1 tablet (20 mg total) by mouth daily. 07/16/21   Einar Pheasant, MD  pantoprazole (PROTONIX) 40 MG tablet Take 40 mg by mouth daily. 05/25/21   [provider]  trimethoprim-polymyxin b (POLYTRIM) ophthalmic solution Place 1 drop into both eyes  every 4 (four) hours. 1 drop into both eyes every 4 hours while awake x7 days. 05/29/21   Hazel Sams, PA-C    Family History Family History  Problem Relation Age of Onset   Hypertension Mother    Hypercholesterolemia Mother    Rheumatic fever Mother    Diabetes Father    Hypertension Father    Hypercholesterolemia Father    Heart disease Father        CABG   Diabetes Sister    Hypertension Brother    Breast cancer Maternal Aunt    Breast cancer Cousin    Colon cancer Neg Hx     Social History Social History   Tobacco Use   Smoking status: Never   Smokeless tobacco: Never  Vaping Use   Vaping Use: Never used  Substance Use Topics   Alcohol use: No    Alcohol/week: 0.0 standard drinks of alcohol   Drug use: No     Allergies   Norvasc [amlodipine besylate], Contrast media [iodinated contrast media], Penicillins, Codeine, Diclofenac, Felodipine, Lipitor [atorvastatin], Stay awake [caffeine], Zocor [simvastatin], and Benicar [olmesartan]   Review of Systems Review of Systems  Musculoskeletal:  Positive for arthralgias and joint swelling. Negative for gait problem.  Skin:  Negative for color change, rash and wound.  Neurological:  Negative for weakness and numbness.     Physical Exam Triage Vital Signs ED Triage Vitals  Enc Vitals Group     BP 09/06/21 1335 (!) 167/80     Pulse Rate 09/06/21 1335 89     Resp 09/06/21 1335 17     Temp 09/06/21 1335 98.4 F (36.9 C)     Temp Source 09/06/21 1335 Oral     SpO2 09/06/21 1335 98 %     Weight 09/06/21 1330 180 lb (81.6 kg)     Height 09/06/21 1330 '5\' 3"'$  (1.6 m)     Head Circumference --      Peak Flow --      Pain Score 09/06/21 1330 0     Pain Loc --      Pain Edu? --      Excl. in [place]? --    No data found.  Updated Vital Signs BP (!) 167/80 (BP Location: Right Arm)   Pulse 89   Temp 98.4 F (36.9 C) (Oral)   Resp 17   Ht '5\' 3"'$  (1.6 m)   Wt 180 lb (81.6 kg)   LMP 02/21/1984   SpO2 98%   BMI 31.89  kg/m     Physical Exam Vitals and nursing note reviewed.  Constitutional:      General: She is not in acute distress.    Appearance: Normal appearance. She is not ill-appearing or toxic-appearing.  HENT:     Head: Normocephalic and atraumatic.  Eyes:     General: No scleral icterus.       Right eye: No discharge.        Left eye: No discharge.     Conjunctiva/sclera: Conjunctivae normal.  Cardiovascular:     Rate and Rhythm: Normal rate and regular rhythm.     Heart sounds: Normal heart sounds.  Pulmonary:     Effort: Pulmonary effort is normal. No respiratory distress.     Breath sounds: Normal breath sounds.  Musculoskeletal:     Cervical back: Neck supple.     Right lower leg: Edema present.     Left lower leg: No edema.     Comments: Right lower extremity: There is nonpitting edema of the right lower leg and ankle.  Calf measures 37 cm in circumference.  The left calf measures 33 cm in circumference.  She has mild calf tenderness bilaterally.  No ecchymosis or erythema noted of lower extremity.  Mild tenderness of anterior ankle but no tenderness over the malleoli or foot.  Full range of motion of ankle and foot.  Normal pulses and strength.  Skin:    General: Skin is dry.  Neurological:     General: No focal deficit present.     Mental Status: She is alert. Mental status is at baseline.     Motor: No weakness.     Gait: Gait normal.  Psychiatric:        Mood and Affect: Mood normal.        Behavior: Behavior normal.        Thought Content: Thought content normal.      UC Treatments / Results  Labs (all labs ordered are listed, but only abnormal results are displayed) Labs Reviewed - No data to display  EKG   Radiology No results found.  Procedures Procedures (including critical care time)  Medications Ordered in UC Medications - No data to display  Initial Impression / Assessment and Plan / UC Course  I have reviewed the triage vital signs and the  nursing notes.  Pertinent labs & imaging results that were available during my care of the patient were reviewed by me and considered in my medical decision making (see chart for details).  81 year old female presenting for evaluation of right lower extremity swelling over the past 1 year which is recently become worse over the past 1 week and 1 day.  No history of DVT or PE.  History of varicose veins and patient reports all the symptoms started after she had right hip surgery a year ago.  Denies any recent surgeries or travel.  On exam she does have swelling of her calf compared to the opposite.  4 cm difference.  Mild calf tenderness bilaterally.  Mild tenderness of ankle but no bony tenderness and full range of motion of ankle.  Denies injury.  Discussed with patient the most likely cause of her swelling is combination of varicose veins and possibly vein damage over the damage to lymphatic system secondary to her surgery.  Other concern would be potential DVT.  I discussed getting an ultrasound of her right lower extremity and offered to do the outpatient [facility] patient declines and wants to see what her primary care provider will do tomorrow.  I have advised her to monitor her symptoms closely until then.  Advised of RICE guidelines and to get some compression hose.  Reviewed ED precautions with her.   Final Clinical Impressions(s) / UC Diagnoses   Final diagnoses:  Right  leg swelling     Discharge Instructions      -We discussed obtaining an ultrasound of her lower extremity to rule out a clot but he would like to wait for your appointment to see your doctor tomorrow.  I think that is reasonable since you do not really have any pain.  Your swelling is most likely related to potential damage to vessels and lymphatic system relating to the surgery and varicose veins as well as the fact that your legs have not been elevated much recently. - Elevate your lower extremities consistently over the  next 24 hours.  Also wear compression hose.  If the swelling goes down completely you may not need an ultrasound but if it remains you likely will need an ultrasound to rule out a blood clot.  If your pain worsens overnight or your swelling does you should go to the ER.   ED Prescriptions   None    PDMP not reviewed this encounter.   Danton Clap, PA-C 09/06/21 1425

## 2021-09-06 NOTE — Telephone Encounter (Signed)
Noted and Joycelyn Schmid, NP is aware.

## 2021-09-06 NOTE — ED Triage Notes (Addendum)
Pt states she noticed right ankle swelling starting yesterday. Her PCP has been watching it but pt states its getting worse

## 2021-09-06 NOTE — Telephone Encounter (Signed)
Reviewed note.  I was out of office today.  Agree with need for evaluation.  Seen at acute care. Agree with need for f/u.

## 2021-09-06 NOTE — Telephone Encounter (Signed)
Patient say she ha shad some swelling for a while and now getting worse but really became more noticeable yesterday and both ankles are swollen now, non pitting edema "patient says legs not hurting in the calf but are cold to touch, when patient pushed on swollen area no indention". Patient says both legs looks like she has been sitting in the sun around the ankle area and feel numb and tight with swelling, patient does not weigh herself her scales have no battery. Patient cannot get to another office within [place] today no transportation scheduled with Dr. Aundra Dubin for 09/07/21.  Patient has Granddaughter that is an Therapist, sports coming to house to look at her legs and if she feels patient should be seen today she will go to [employer]. Advice patient if legs been to hurt, get colder to touch are color changes to be seen immediately. Please advise to plan?

## 2021-09-06 NOTE — Telephone Encounter (Signed)
Levada Dy called from Access Nurse with patient on the line.  Levada Dy states patient needs to be seen within the next four hours.  Levada Dy states patient has swelling in both feet and ankles and one side is more swollen than the other.  Levada Dy states patient also has numbness in her feet.  We do not have any available appointments in [place] today.  Patient states she cannot drive and would have to find transportation to [employer] at [place] and she did not think that would be possible today.  Patient states she upgraded her phone and lost her MyChart and would need help getting this back on her phone.  I transferred patient to our Practice Administrator, Kerin Salen, RN.

## 2021-09-06 NOTE — Discharge Instructions (Signed)
-  We discussed obtaining an ultrasound of her lower extremity to rule out a clot but he would like to wait for your appointment to see your doctor tomorrow.  I think that is reasonable since you do not really have any pain.  Your swelling is most likely related to potential damage to vessels and lymphatic system relating to the surgery and varicose veins as well as the fact that your legs have not been elevated much recently. - Elevate your lower extremities consistently over the next 24 hours.  Also wear compression hose.  If the swelling goes down completely you may not need an ultrasound but if it remains you likely will need an ultrasound to rule out a blood clot.  If your pain worsens overnight or your swelling does you should go to the ER.

## 2021-09-06 NOTE — Telephone Encounter (Signed)
I agree that she needs to be seen asap for concern for leg swelling, circulation ( cold )  Juliann Pulse, can granddaughter not pick her up this morning and take her to [facility] urgent care this afternoon? Is granddaughter able to drive her?  I recommend [facility] as next to [facility] ED and if she was needed to transfer to ED, she would be there. Im not sure what type of imaging ( ultrasound) may be indicated  I would advise patient not to delay seeking care and waiting to see Dr Aundra Dubin tomorrow  at 1:20 may not be safe. She may keep appt with Dr Aundra Dubin but again I would recommend seeking inperson evaluation today  H/o DM No known h/o CHF

## 2021-09-07 ENCOUNTER — Ambulatory Visit: Payer: PPO | Admitting: Internal Medicine

## 2021-09-08 ENCOUNTER — Ambulatory Visit: Payer: PPO | Admitting: Internal Medicine

## 2021-09-10 ENCOUNTER — Other Ambulatory Visit: Payer: Self-pay | Admitting: Internal Medicine

## 2021-09-17 ENCOUNTER — Telehealth: Payer: Self-pay

## 2021-09-17 ENCOUNTER — Ambulatory Visit (INDEPENDENT_AMBULATORY_CARE_PROVIDER_SITE_OTHER): Payer: PPO | Admitting: Internal Medicine

## 2021-09-17 ENCOUNTER — Encounter: Payer: Self-pay | Admitting: Internal Medicine

## 2021-09-17 VITALS — BP 130/82 | HR 102 | Temp 98.0°F | Ht 63.0 in | Wt 183.4 lb

## 2021-09-17 DIAGNOSIS — D72829 Elevated white blood cell count, unspecified: Secondary | ICD-10-CM

## 2021-09-17 DIAGNOSIS — I10 Essential (primary) hypertension: Secondary | ICD-10-CM | POA: Diagnosis not present

## 2021-09-17 DIAGNOSIS — E1165 Type 2 diabetes mellitus with hyperglycemia: Secondary | ICD-10-CM | POA: Diagnosis not present

## 2021-09-17 DIAGNOSIS — E7800 Pure hypercholesterolemia, unspecified: Secondary | ICD-10-CM | POA: Diagnosis not present

## 2021-09-17 DIAGNOSIS — D649 Anemia, unspecified: Secondary | ICD-10-CM

## 2021-09-17 DIAGNOSIS — E871 Hypo-osmolality and hyponatremia: Secondary | ICD-10-CM

## 2021-09-17 DIAGNOSIS — E042 Nontoxic multinodular goiter: Secondary | ICD-10-CM | POA: Diagnosis not present

## 2021-09-17 DIAGNOSIS — N1831 Chronic kidney disease, stage 3a: Secondary | ICD-10-CM

## 2021-09-17 LAB — CBC WITH DIFFERENTIAL/PLATELET
Absolute Monocytes: 678 cells/uL (ref 200–950)
Basophils Absolute: 85 cells/uL (ref 0–200)
Basophils Relative: 0.7 %
Eosinophils Absolute: 278 cells/uL (ref 15–500)
Eosinophils Relative: 2.3 %
HCT: 37.5 % (ref 35.0–45.0)
Hemoglobin: 12.2 g/dL (ref 11.7–15.5)
Lymphs Abs: 2602 cells/uL (ref 850–3900)
MCH: 28.5 pg (ref 27.0–33.0)
MCHC: 32.5 g/dL (ref 32.0–36.0)
MCV: 87.6 fL (ref 80.0–100.0)
MPV: 11.9 fL (ref 7.5–12.5)
Monocytes Relative: 5.6 %
Neutro Abs: 8458 cells/uL — ABNORMAL HIGH (ref 1500–7800)
Neutrophils Relative %: 69.9 %
Platelets: 270 10*3/uL (ref 140–400)
RBC: 4.28 10*6/uL (ref 3.80–5.10)
RDW: 14.9 % (ref 11.0–15.0)
Total Lymphocyte: 21.5 %
WBC: 12.1 10*3/uL — ABNORMAL HIGH (ref 3.8–10.8)

## 2021-09-17 NOTE — Telephone Encounter (Signed)
At [facility] today, Dr. Randell Patient Cathy Vazquez's instructions state she would like to have an appointment to see patient in 10 days.  We do have some appointments available around that timeframe, but they are at 8:00am, and patient states she can't make it for an appointment that early.  Patient has been scheduled to see Dr. Nicki Reaper on 11/02/2021 at 2:30pm.

## 2021-09-17 NOTE — Progress Notes (Unsigned)
Patient ID: Cathy Vazquez, female   DOB: 1940-11-05, 81 y.o.   MRN: [id number]   Subjective:    Patient ID: Cathy Vazquez, female    DOB: 01-05-1941, 81 y.o.   MRN: [id number]   Patient here for a scheduled follow up.   Chief Complaint  Patient presents with   Follow-up   .   HPI Here to follow up regarding her blood pressure and blood sugar.  She was seen urgent care 09/06/21 - right leg swelling.  Ultrasound was not performed.  Swelling is better.  Leg pain noticed when getting into car.  Rec:  PT and ortho.  Compression hose.  Has not been checking sugars.  Was wearing CGM.  Fell off.  Has not replaced.  Discussed importance of checking blood sugars.  Discussed low carb diet and exercise as tolerated.  Felt a little different today.  Ate prior to coming in and drank a nutritional shake.  Also drank orange juice after arriving to the office.  Thought sugar was low.  Sugar checked at visit - 320.  No chest pain.  States feeling better now.  Breathing stable.  No nausea or vomiting.  No abdominal pain. Bowels moving.     Past Medical History:  Diagnosis Date   Anemia    Diverticulosis    GERD (gastroesophageal reflux disease)    Glaucoma    Hypercholesterolemia    Hypertension    Thyroid nodule    Past Surgical History:  Procedure Laterality Date   ABDOMINAL HYSTERECTOMY  1987   fibroids, ovaries not removed   TONSILLECTOMY     Family History  Problem Relation Age of Onset   Hypertension Mother    Hypercholesterolemia Mother    Rheumatic fever Mother    Diabetes Father    Hypertension Father    Hypercholesterolemia Father    Heart disease Father        CABG   Diabetes Sister    Hypertension Brother    Breast cancer Maternal Aunt    Breast cancer Cousin    Colon cancer Neg Hx    Social History   Socioeconomic History   Marital status: Divorced    Spouse name: Not on file   Number of children: 2   Years of education: Not on file   Highest education  level: Not on file  Occupational History   Not on file  Tobacco Use   Smoking status: Never   Smokeless tobacco: Never  Vaping Use   Vaping Use: Never used  Substance and Sexual Activity   Alcohol use: No    Alcohol/week: 0.0 standard drinks of alcohol   Drug use: No   Sexual activity: Not Currently  Other Topics Concern   Not on file  Social History Narrative   Not on file   Social Determinants of Health   Financial Resource Strain: Low Risk  (05/18/2021)   Overall Financial Resource Strain (CARDIA)    Difficulty of Paying Living Expenses: Not hard at all  Food Insecurity: No Food Insecurity (05/24/2021)   Hunger Vital Sign    Worried About Running Out of Food in the Last Year: Never true    Ran Out of Food in the Last Year: Never true  Transportation Needs: No Transportation Needs (05/18/2021)   PRAPARE - Hydrologist (Medical): No    Lack of Transportation (Non-Medical): No  Physical Activity: Unknown (05/11/2020)   Exercise Vital Sign    Days of Exercise  per Week: 0 days    Minutes of Exercise per Session: Not on file  Stress: No Stress Concern Present (05/11/2020)   [place]    Feeling of Stress : Not at all  Social Connections: Unknown (05/18/2021)   Social Connection and Isolation Panel [NHANES]    Frequency of Communication with Friends and Family: More than three times a week    Frequency of Social Gatherings with Friends and Family: More than three times a week    Attends Religious Services: Not on Advertising copywriter or Organizations: Not on file    Attends Archivist Meetings: Not on file    Marital Status: Not on file     Review of Systems  Constitutional:  Negative for appetite change and unexpected weight change.  HENT:  Negative for congestion and sinus pressure.   Respiratory:  Negative for cough, chest tightness and shortness of breath.    Cardiovascular:  Negative for chest pain and palpitations.       No increased leg swelling currently.  Leg swelling improved.   Gastrointestinal:  Negative for abdominal pain, diarrhea, nausea and vomiting.  Genitourinary:  Negative for difficulty urinating and dysuria.  Musculoskeletal:  Negative for joint swelling and myalgias.  Skin:  Negative for color change and rash.  Neurological:  Negative for dizziness, light-headedness and headaches.  Psychiatric/Behavioral:  Negative for agitation and dysphoric mood.        Objective:     BP 130/82 (BP Location: Left Arm, Patient Position: Sitting, Cuff Size: Normal)   Pulse (!) 102   Temp 98 F (36.7 C) (Oral)   Ht '5\' 3"'$  (1.6 m)   Wt 183 lb 6.4 oz (83.2 kg)   LMP 02/21/1984   SpO2 98%   BMI 32.49 kg/m  Wt Readings from Last 3 Encounters:  09/17/21 183 lb 6.4 oz (83.2 kg)  09/06/21 180 lb (81.6 kg)  06/03/21 181 lb (82.1 kg)    Physical Exam Vitals reviewed.  Constitutional:      General: She is not in acute distress.    Appearance: Normal appearance.  HENT:     Head: Normocephalic and atraumatic.     Right Ear: External ear normal.     Left Ear: External ear normal.  Eyes:     General: No scleral icterus.       Right eye: No discharge.        Left eye: No discharge.     Conjunctiva/sclera: Conjunctivae normal.  Neck:     Thyroid: No thyromegaly.  Cardiovascular:     Rate and Rhythm: Normal rate and regular rhythm.  Pulmonary:     Effort: No respiratory distress.     Breath sounds: Normal breath sounds. No wheezing.  Abdominal:     General: Bowel sounds are normal.     Palpations: Abdomen is soft.     Tenderness: There is no abdominal tenderness.  Musculoskeletal:        General: No swelling or tenderness.     Cervical back: Neck supple. No tenderness.  Lymphadenopathy:     Cervical: No cervical adenopathy.  Skin:    Findings: No erythema or rash.  Neurological:     Mental Status: She is alert.   Psychiatric:        Mood and Affect: Mood normal.        Behavior: Behavior normal.      Outpatient Encounter Medications as  of 09/17/2021  Medication Sig   acetaminophen (TYLENOL) 325 MG tablet Take 650 mg by mouth every 4 (four) hours as needed.   Alpha-Lipoic Acid 200 MG CAPS Take 200 mg by mouth daily.   Bioflavonoid Products (BIOFLEX PO) Take 1 tablet by mouth daily.    Continuous Blood Gluc Receiver (FREESTYLE LIBRE 2 READER) DEVI 1 application. by Does not apply route every 14 (fourteen) days.   Continuous Blood Gluc Sensor (FREESTYLE LIBRE 2 SENSOR) MISC 1 application. by Does not apply route every 14 (fourteen) days.   fish oil-omega-3 fatty acids 1000 MG capsule Take 1 capsule (1 g total) by mouth daily.   Multiple Vitamin (MULTIVITAMIN) tablet Take 1 tablet by mouth daily.   olmesartan (BENICAR) 20 MG tablet Take 1 tablet (20 mg total) by mouth daily.   pantoprazole (PROTONIX) 40 MG tablet TAKE 1 TABLET BY MOUTH EVERY DAY   trimethoprim-polymyxin b (POLYTRIM) ophthalmic solution Place 1 drop into both eyes every 4 (four) hours. 1 drop into both eyes every 4 hours while awake x7 days.   [DISCONTINUED] benzonatate (TESSALON) 100 MG capsule Take 1 capsule (100 mg total) by mouth every 8 (eight) hours. (Patient not taking: Reported on 06/03/2021)   Facility-Administered Encounter Medications as of 09/17/2021  Medication   cyanocobalamin ((VITAMIN B-12)) injection 1,000 mcg   cyanocobalamin ((VITAMIN B-12)) injection 1,000 mcg     Lab Results  Component Value Date   WBC 12.1 (H) 09/17/2021   HGB 12.2 09/17/2021   HCT 37.5 09/17/2021   PLT 270 09/17/2021   GLUCOSE 342 (H) 09/17/2021   CHOL 191 07/15/2021   TRIG 206.0 (H) 07/15/2021   HDL 36.20 (L) 07/15/2021   LDLDIRECT 130.0 07/15/2021   LDLCALC 165 (H) 01/25/2019   ALT 17 07/15/2021   AST 13 07/15/2021   NA 133 (L) 09/17/2021   K 5.2 09/17/2021   CL 97 (L) 09/17/2021   CREATININE 1.17 (H) 09/17/2021   BUN 24  09/17/2021   CO2 24 09/17/2021   TSH 3.34 07/15/2021   HGBA1C 7.7 (H) 07/15/2021   MICROALBUR 3.3 (H) 07/29/2021       Assessment & Plan:   Problem List Items Addressed This Visit     Anemia    Follow cbc.       CKD (chronic kidney disease) stage 3, GFR 30-59 ml/min (HCC)    Avoid antiinflammatories.  Follow metabolic panel.  Continue benicar.       Hypercholesteremia    [facility] Lipid diet.  Have discussed calculated cholesterol risk and need for cholesterol medication.  Declines.  Follow lipid panel.       Hypertension    On benicar.  Blood pressures as outlined.  No changes.   Follow metabolic panel.  Follow pressures.        Hyponatremia    Recheck sodium today to confirm wnl       Leukocytosis - Primary    Recheck cbc to confirm wnl.       Relevant Orders   CBC with Differential/Platelet (Completed)   Multiple thyroid nodules    Has seen endocrinology previously.  Follow tsh. Needs f/u thyroid ultrasound.        Type 2 diabetes mellitus with hyperglycemia (HCC)    Last a1c elevated 7.8.  She declined medication.  Sugar as outlined. Today.  Have no sugar readings.  She ate and drank high sugary foods prior to coming to the office and at the office.  Thought sugar was low.  Is  feeling better.  Sugar 320.  Discussed need for medication.  Check labs, including renal function.  Too early for a1c.  Libre sensor placed today.  Check sugars and send in readings over the next week.  Needs medication. Need some idea as to what her sugars are running.  She would like to avoid insulin.  Follow closely.       Relevant Orders   BASIC METABOLIC PANEL WITH GFR (Completed)     Einar Pheasant, MD

## 2021-09-18 LAB — BASIC METABOLIC PANEL WITH GFR
BUN/Creatinine Ratio: 21 (calc) (ref 6–22)
BUN: 24 mg/dL (ref 7–25)
CO2: 24 mmol/L (ref 20–32)
Calcium: 9.6 mg/dL (ref 8.6–10.4)
Chloride: 97 mmol/L — ABNORMAL LOW (ref 98–110)
Creat: 1.17 mg/dL — ABNORMAL HIGH (ref 0.60–0.95)
Glucose, Bld: 342 mg/dL — ABNORMAL HIGH (ref 65–99)
Potassium: 5.2 mmol/L (ref 3.5–5.3)
Sodium: 133 mmol/L — ABNORMAL LOW (ref 135–146)
eGFR: 46 mL/min/{1.73_m2} — ABNORMAL LOW (ref >=60)

## 2021-09-18 NOTE — Telephone Encounter (Signed)
Please block appt slot on 10/01/21 - 11:00 and Please schedule Cathy Vazquez for the 11:00 on 10/01/21.

## 2021-09-19 NOTE — Assessment & Plan Note (Signed)
Last a1c elevated 7.8.  She declined medication.  Sugar as outlined. Today.  Have no sugar readings.  She ate and drank high sugary foods prior to coming to the office and at the office.  Thought sugar was low.  Is feeling better.  Sugar 320.  Discussed need for medication.  Check labs, including renal function.  Too early for a1c.  Libre sensor placed today.  Check sugars and send in readings over the next week.  Needs medication. Need some idea as to what her sugars are running.  She would like to avoid insulin.  Follow closely.

## 2021-09-19 NOTE — Assessment & Plan Note (Signed)
Has seen endocrinology previously.  Follow tsh. Needs f/u thyroid ultrasound.   

## 2021-09-19 NOTE — Assessment & Plan Note (Signed)
Recheck cbc to confirm wnl.

## 2021-09-19 NOTE — Assessment & Plan Note (Signed)
On benicar.  Blood pressures as outlined.  No changes.   Follow metabolic panel.  Follow pressures.

## 2021-09-19 NOTE — Assessment & Plan Note (Signed)
D[facility] Lipid diet.  Have discussed calculated cholesterol risk and need for cholesterol medication.  Declines.  Follow lipid panel.  

## 2021-09-19 NOTE — Assessment & Plan Note (Signed)
Avoid antiinflammatories.  Follow metabolic panel.  Continue benicar.

## 2021-09-19 NOTE — Assessment & Plan Note (Signed)
Follow cbc.  

## 2021-09-19 NOTE — Assessment & Plan Note (Signed)
Recheck sodium today to confirm wnl

## 2021-09-20 NOTE — Telephone Encounter (Signed)
Pt scheduled and advised

## 2021-09-25 ENCOUNTER — Telehealth: Payer: Self-pay | Admitting: Internal Medicine

## 2021-09-25 NOTE — Telephone Encounter (Signed)
-----   Message from Philemon Kingdom, MD sent at 09/24/2021  4:58 PM EDT ----- Regarding: RE: question Hi Letcher Schweikert, She needs insulin.  I would start with 1 long-acting insulin a day, probably Lantus or Basaglar or Semglee, whichever is cheaper.  If you feel that she can understand how to titrate up if needed, you can advise her to do that, if not, you can have her back in 2 weeks or so to see if she needs intensification of treatment. Hope this helps. Salena Saner ----- Message ----- From: Einar Pheasant, MD Sent: 09/22/2021   6:32 PM EDT To: Philemon Kingdom, MD Subject: question                                       Ms Younglove's sugars (until recently) were relatively well controlled on no medication.  Her a1c on the last couple of checks - 7.7 and 7.8.  she refused medication.  She came to the office recently for routine follow up.  Sugar was significantly elevated.  She has not been checking her sugars.  Discussed CGM.  She was agreeable.  We placed sensor in the office and are trying to get coverage for her (to cover sensors).  Her readings now - fasting 225-230 and non fasting 250-300.  GFR is 46.  I wanted to get your input regarding further treatment.  Cost is an issue.  I am not sure how well she will do with injections.  She had previously refused metformin, but is reluctantly agreeing now to start medication.  I would appreciate your input.  I am going to try to see if I can get her established with endo as well.  Thank you for your help.  I appreciate it.    Rosselyn Martha

## 2021-09-27 ENCOUNTER — Other Ambulatory Visit: Payer: Self-pay

## 2021-09-27 MED ORDER — PEN NEEDLES 32G X 4 MM MISC: 0 refills | Status: DC

## 2021-09-27 MED ORDER — BASAGLAR KWIKPEN 100 UNIT/ML ~~LOC~~ SOPN
10.0000 [IU] | PEN_INJECTOR | Freq: Every day | SUBCUTANEOUS | 0 refills | Status: DC
Start: 2021-09-27 — End: 2021-10-01

## 2021-09-30 ENCOUNTER — Ambulatory Visit: Payer: PPO

## 2021-10-01 ENCOUNTER — Encounter: Payer: Self-pay | Admitting: Internal Medicine

## 2021-10-01 ENCOUNTER — Other Ambulatory Visit: Payer: Self-pay

## 2021-10-01 ENCOUNTER — Ambulatory Visit (INDEPENDENT_AMBULATORY_CARE_PROVIDER_SITE_OTHER): Payer: PPO | Admitting: Internal Medicine

## 2021-10-01 DIAGNOSIS — N1831 Chronic kidney disease, stage 3a: Secondary | ICD-10-CM

## 2021-10-01 DIAGNOSIS — E7800 Pure hypercholesterolemia, unspecified: Secondary | ICD-10-CM

## 2021-10-01 DIAGNOSIS — D72829 Elevated white blood cell count, unspecified: Secondary | ICD-10-CM

## 2021-10-01 DIAGNOSIS — I10 Essential (primary) hypertension: Secondary | ICD-10-CM | POA: Diagnosis not present

## 2021-10-01 DIAGNOSIS — E1165 Type 2 diabetes mellitus with hyperglycemia: Secondary | ICD-10-CM | POA: Diagnosis not present

## 2021-10-01 MED ORDER — TRESIBA FLEXTOUCH 100 UNIT/ML ~~LOC~~ SOPN
10.0000 [IU] | PEN_INJECTOR | Freq: Every day | SUBCUTANEOUS | 6 refills | Status: DC
Start: 2021-10-01 — End: 2022-06-19

## 2021-10-01 NOTE — Progress Notes (Signed)
Patient ID: Cathy Vazquez, female   DOB: [birth year], 81 y.o.   MRN: [id number]   Subjective:    Patient ID: Cathy Vazquez, female    DOB: February 23, 1940, 81 y.o.   MRN: [id number]   Patient here for a scheduled follow up .   HPI Here to follow up regarding elevated blood sugars.  Also has history of hypercholesterolemia and hypertension.  She is accompanied by her daughter.  History obtained from both of them.  Was found recently to have significant elevated blood sugar (>300 last check in office).  CGM placed in office.  Sugars initially >300 and have since trended down.  Most recent checks - 180-220.  She is trying to watch her diet.  Is trying to stay active.  No chest pain.  Breathing stable.  No nausea or vomiting.  Bowels moving.  Here today to discuss above and for insulin teaching.     Past Medical History:  Diagnosis Date   Anemia    Diverticulosis    GERD (gastroesophageal reflux disease)    Glaucoma    Hypercholesterolemia    Hypertension    Thyroid nodule    Past Surgical History:  Procedure Laterality Date   ABDOMINAL HYSTERECTOMY  1987   fibroids, ovaries not removed   TONSILLECTOMY     Family History  Problem Relation Age of Onset   Hypertension Mother    Hypercholesterolemia Mother    Rheumatic fever Mother    Diabetes Father    Hypertension Father    Hypercholesterolemia Father    Heart disease Father        CABG   Diabetes Sister    Hypertension Brother    Breast cancer Maternal Aunt    Breast cancer Cousin    Colon cancer Neg Hx    Social History   Socioeconomic History   Marital status: Divorced    Spouse name: Not on file   Number of children: 2   Years of education: Not on file   Highest education level: Not on file  Occupational History   Not on file  Tobacco Use   Smoking status: Never   Smokeless tobacco: Never  Vaping Use   Vaping Use: Never used  Substance and Sexual Activity   Alcohol use: No    Alcohol/week: 0.0 standard  drinks of alcohol   Drug use: No   Sexual activity: Not Currently  Other Topics Concern   Not on file  Social History Narrative   Not on file   Social Determinants of Health   Financial Resource Strain: Low Risk  (05/18/2021)   Overall Financial Resource Strain (CARDIA)    Difficulty of Paying Living Expenses: Not hard at all  Food Insecurity: No Food Insecurity (05/24/2021)   Hunger Vital Sign    Worried About Running Out of Food in the Last Year: Never true    Ran Out of Food in the Last Year: Never true  Transportation Needs: No Transportation Needs (05/18/2021)   PRAPARE - Hydrologist (Medical): No    Lack of Transportation (Non-Medical): No  Physical Activity: Unknown (05/11/2020)   Exercise Vital Sign    Days of Exercise per Week: 0 days    Minutes of Exercise per Session: Not on file  Stress: No Stress Concern Present (05/11/2020)   [place]    Feeling of Stress : Not at all  Social Connections: Unknown (05/18/2021)   Social Connection  and Isolation Panel [NHANES]    Frequency of Communication with Friends and Family: More than three times a week    Frequency of Social Gatherings with Friends and Family: More than three times a week    Attends Religious Services: Not on Advertising copywriter or Organizations: Not on file    Attends Archivist Meetings: Not on file    Marital Status: Not on file     Review of Systems  Constitutional:  Negative for appetite change and unexpected weight change.  HENT:  Negative for congestion and sinus pressure.   Respiratory:  Negative for cough, chest tightness and shortness of breath.   Cardiovascular:  Negative for chest pain, palpitations and leg swelling.  Gastrointestinal:  Negative for abdominal pain, diarrhea, nausea and vomiting.  Genitourinary:  Negative for difficulty urinating and dysuria.  Musculoskeletal:   Negative for joint swelling and myalgias.  Skin:  Negative for color change and rash.  Neurological:  Negative for dizziness and headaches.  Psychiatric/Behavioral:  Negative for agitation and dysphoric mood.        Objective:     BP 126/80 (BP Location: Left Arm, Patient Position: Sitting, Cuff Size: Large)   Pulse 100   Temp 98.4 F (36.9 C) (Temporal)   Resp 17   Ht $R'5\' 3"'IU$  (1.6 m)   Wt 179 lb (81.2 kg)   LMP 02/21/1984   SpO2 98%   BMI 31.71 kg/m  Wt Readings from Last 3 Encounters:  10/01/21 179 lb (81.2 kg)  09/17/21 183 lb 6.4 oz (83.2 kg)  09/06/21 180 lb (81.6 kg)    Physical Exam Vitals reviewed.  Constitutional:      General: She is not in acute distress.    Appearance: Normal appearance.  HENT:     Head: Normocephalic and atraumatic.     Right Ear: External ear normal.     Left Ear: External ear normal.  Eyes:     General: No scleral icterus.       Right eye: No discharge.        Left eye: No discharge.     Conjunctiva/sclera: Conjunctivae normal.  Neck:     Thyroid: No thyromegaly.  Cardiovascular:     Rate and Rhythm: Normal rate and regular rhythm.  Pulmonary:     Effort: No respiratory distress.     Breath sounds: Normal breath sounds. No wheezing.  Abdominal:     General: Bowel sounds are normal.     Palpations: Abdomen is soft.     Tenderness: There is no abdominal tenderness.  Musculoskeletal:        General: No swelling or tenderness.     Cervical back: Neck supple. No tenderness.  Lymphadenopathy:     Cervical: No cervical adenopathy.  Skin:    Findings: No erythema or rash.  Neurological:     Mental Status: She is alert.  Psychiatric:        Mood and Affect: Mood normal.        Behavior: Behavior normal.      Outpatient Encounter Medications as of 10/01/2021  Medication Sig   acetaminophen (TYLENOL) 325 MG tablet Take 650 mg by mouth every 4 (four) hours as needed.   Alpha-Lipoic Acid 200 MG CAPS Take 200 mg by mouth daily.    Bioflavonoid Products (BIOFLEX PO) Take 1 tablet by mouth daily.    Continuous Blood Gluc Receiver (FREESTYLE LIBRE 2 READER) DEVI 1 application. by Does not apply  route every 14 (fourteen) days.   Continuous Blood Gluc Sensor (FREESTYLE LIBRE 2 SENSOR) MISC 1 application. by Does not apply route every 14 (fourteen) days.   fish oil-omega-3 fatty acids 1000 MG capsule Take 1 capsule (1 g total) by mouth daily.   insulin degludec (TRESIBA FLEXTOUCH) 100 UNIT/ML FlexTouch Pen Inject 10 Units into the skin daily.   Insulin Pen Needle (PEN NEEDLES) 32G X 4 MM MISC Used to give daily insulin injections.   Multiple Vitamin (MULTIVITAMIN) tablet Take 1 tablet by mouth daily.   olmesartan (BENICAR) 20 MG tablet Take 1 tablet (20 mg total) by mouth daily.   pantoprazole (PROTONIX) 40 MG tablet TAKE 1 TABLET BY MOUTH EVERY DAY   trimethoprim-polymyxin b (POLYTRIM) ophthalmic solution Place 1 drop into both eyes every 4 (four) hours. 1 drop into both eyes every 4 hours while awake x7 days.   [DISCONTINUED] Insulin Glargine (BASAGLAR KWIKPEN) 100 UNIT/ML Inject 10 Units into the skin daily.   Facility-Administered Encounter Medications as of 10/01/2021  Medication   cyanocobalamin ((VITAMIN B-12)) injection 1,000 mcg   cyanocobalamin ((VITAMIN B-12)) injection 1,000 mcg     Lab Results  Component Value Date   WBC 12.1 (H) 09/17/2021   HGB 12.2 09/17/2021   HCT 37.5 09/17/2021   PLT 270 09/17/2021   GLUCOSE 342 (H) 09/17/2021   CHOL 191 07/15/2021   TRIG 206.0 (H) 07/15/2021   HDL 36.20 (L) 07/15/2021   LDLDIRECT 130.0 07/15/2021   LDLCALC 165 (H) 01/25/2019   ALT 17 07/15/2021   AST 13 07/15/2021   NA 133 (L) 09/17/2021   K 5.2 09/17/2021   CL 97 (L) 09/17/2021   CREATININE 1.17 (H) 09/17/2021   BUN 24 09/17/2021   CO2 24 09/17/2021   TSH 3.34 07/15/2021   HGBA1C 7.7 (H) 07/15/2021   MICROALBUR 3.3 (H) 07/29/2021    No results found.     Assessment & Plan:   Problem List Items  Addressed This Visit     CKD (chronic kidney disease) stage 3, GFR 30-59 ml/min (HCC)    Avoid antiinflammatories.  Follow metabolic panel.  Continue benicar.       Hypercholesteremia    D[facility] Lipid diet.  Have discussed calculated cholesterol risk and need for cholesterol medication.  Declines.  Follow lipid panel.       Relevant Orders   Hepatic function panel   Lipid panel   Hypertension    On benicar.  Blood pressures as outlined.  No changes.   Follow metabolic panel.  Follow pressures.        Leukocytosis    White blood cell count elevated on recent check.  Recheck cbc to confirm stable/normal.       Relevant Orders   CBC with Differential/Platelet   Type 2 diabetes mellitus with hyperglycemia (HCC)    Last a1c elevated 7.8.  She declined medication.  Sugar as outlined. Trending down.  Given significant elevation of sugars, discussed starting insulin.  She was agreeable.  Had called in basaglar.  Insurance would not cover.  Sample of tresiba given to her and cma instructed on proper way to give insulin.  Pharmacy notified and stated insurance would cover tresiba.  Continue low carb diet and exercise.  Discussed [facility] referral.  Will notify me if agreeable. Follow closely. Schedule f/u met b and a1c.       Relevant Medications   insulin degludec (TRESIBA FLEXTOUCH) 100 UNIT/ML FlexTouch Pen   Other Relevant Orders   Basic  metabolic panel   Hemoglobin A1c     Einar Pheasant, MD

## 2021-10-02 ENCOUNTER — Telehealth: Payer: Self-pay | Admitting: Internal Medicine

## 2021-10-02 NOTE — Telephone Encounter (Signed)
-----   Message from Philemon Kingdom, MD sent at 09/24/2021  4:58 PM EDT ----- Regarding: RE: question Hi Cathy Vazquez, She needs insulin.  I would start with 1 long-acting insulin a day, probably Lantus or Basaglar or Semglee, whichever is cheaper.  If you feel that she can understand how to titrate up if needed, you can advise her to do that, if not, you can have her back in 2 weeks or so to see if she needs intensification of treatment. Hope this helps. Cathy Vazquez ----- Message ----- From: Einar Pheasant, MD Sent: 09/22/2021   6:32 PM EDT To: Philemon Kingdom, MD Subject: question                                       Cathy Vazquez's sugars (until recently) were relatively well controlled on no medication.  Her a1c on the last couple of checks - 7.7 and 7.8.  she refused medication.  She came to the office recently for routine follow up.  Sugar was significantly elevated.  She has not been checking her sugars.  Discussed CGM.  She was agreeable.  We placed sensor in the office and are trying to get coverage for her (to cover sensors).  Her readings now - fasting 225-230 and non fasting 250-300.  GFR is 46.  I wanted to get your input regarding further treatment.  Cost is an issue.  I am not sure how well she will do with injections.  She had previously refused metformin, but is reluctantly agreeing now to start medication.  I would appreciate your input.  I am going to try to see if I can get her established with endo as well.  Thank you for your help.  I appreciate it.    Latrel Szymczak

## 2021-10-02 NOTE — Telephone Encounter (Signed)
See previous message.  Discussed with patient in more detail at her 10/01/21 appt

## 2021-10-11 ENCOUNTER — Encounter: Payer: Self-pay | Admitting: Internal Medicine

## 2021-10-11 NOTE — Assessment & Plan Note (Signed)
Last a1c elevated 7.8.  She declined medication.  Sugar as outlined. Trending down.  Given significant elevation of sugars, discussed starting insulin.  She was agreeable.  Had called in basaglar.  Insurance would not cover.  Sample of tresiba given to her and cma instructed on proper way to give insulin.  Pharmacy notified and stated insurance would cover tresiba.  Continue low carb diet and exercise.  Discussed [facility] referral.  Will notify me if agreeable. Follow closely. Schedule f/u met b and a1c.

## 2021-10-11 NOTE — Telephone Encounter (Signed)
Ok to send in lantus to see if cheaper for pt.  We have already tried basaglar.  Has she been using tresiba.  Tolerating.  How are sugars running?  Let me know if questions or problems.

## 2021-10-11 NOTE — Assessment & Plan Note (Signed)
D[facility] Lipid diet.  Have discussed calculated cholesterol risk and need for cholesterol medication.  Declines.  Follow lipid panel.  

## 2021-10-11 NOTE — Assessment & Plan Note (Signed)
Avoid antiinflammatories.  Follow metabolic panel.  Continue benicar.

## 2021-10-11 NOTE — Assessment & Plan Note (Signed)
On benicar.  Blood pressures as outlined.  No changes.   Follow metabolic panel.  Follow pressures.

## 2021-10-11 NOTE — Assessment & Plan Note (Signed)
White blood cell count elevated on recent check.  Recheck cbc to confirm stable/normal.  

## 2021-10-13 ENCOUNTER — Other Ambulatory Visit: Payer: Self-pay

## 2021-10-13 DIAGNOSIS — E1165 Type 2 diabetes mellitus with hyperglycemia: Secondary | ICD-10-CM

## 2021-10-13 MED ORDER — INSULIN GLARGINE 100 UNITS/ML SOLOSTAR PEN
PEN_INJECTOR | SUBCUTANEOUS | 3 refills | Status: DC
Start: 2021-10-13 — End: 2021-10-13

## 2021-10-13 MED ORDER — INSULIN GLARGINE 100 UNIT/ML SOLOSTAR PEN: 10.0000 [IU] | PEN_INJECTOR | Freq: Every day | SUBCUTANEOUS | 3 refills | Status: DC

## 2021-10-13 MED ORDER — INSULIN GLARGINE 100 UNITS/ML SOLOSTAR PEN: 10.0000 [IU] | PEN_INJECTOR | Freq: Every day | SUBCUTANEOUS | 3 refills | Status: DC

## 2021-10-13 NOTE — Telephone Encounter (Signed)
Lm for pt to cb.

## 2021-10-13 NOTE — Telephone Encounter (Signed)
[id number] sent for pt for medication assistance/adherence

## 2021-10-13 NOTE — Telephone Encounter (Signed)
See previous message.  We had given her sample of tresiba.  Has she been taking?  Need to know how sugars are doing.  Also, ok to get her set up with pt assistance.

## 2021-10-14 ENCOUNTER — Telehealth: Payer: Self-pay | Admitting: Pharmacist

## 2021-10-14 NOTE — Progress Notes (Addendum)
[facility])                                            [place] Team    10/14/2021  Cathy Vazquez 09/17/1940 [id number]   Received referral for medication assistance for Tresiba for Cathy Vazquez. Patient was called. Unfortunately, she did not answer. HIPAA compliant message was left on her voicemail.  Patient's Pharmacy was called as Medicare patients usually have a $35 or less copay for insulin.  The Pharmacist at [facility] reported [name] had a copay of $90 because the prescription was written for 10 units daily which is technically a 90 day supply.    If Insulin dose is going to be titrated eventually, the prescription could be written to include the total daily dose and the days supply would be calculated off of the total daily dose versus the initial dose.  For example:  Inject 10 units daily to be titrated to a total daily dose of 30 units daily.    Patient will be recalled to investigate patient assistance programs. The patient assistance process takes anywhere from 4-8 weeks before the patient actually receives medication from a program.  To begin therapy ASAP, patient will need to receive samples or have her prescription rewritten to include a titration (if one will occur) so that the days supply will be less and the script can be filled for a 30 day supply--which should be no more than a $35 copay.  Plan: Route note to provider. Follow up with the patient  Elayne Guerin, PharmD, [place] Clinical Pharmacist [phone]   ADDENDUM  Patient called me back. HIPAA identifiers were obtained.  From initial financial review, patient may qualify for LIS (low income subsidy) or Extra Help.  The LIS application was completed with the patient online.  She communicated understanding on the necessary financial paperwork.  Plan: Send to [employer], CPhT for the patient assistance process to begin with the applications  being sent to the provider, patient, and program.   Elayne Guerin, PharmD, [place] Clinical Pharmacist [phone]

## 2021-10-15 NOTE — Progress Notes (Signed)
Forwarded to [employer]

## 2021-10-21 NOTE — Progress Notes (Signed)
Discussed with [place]  Please call and see how her sugars are doing.  Also, confirm if still has tresiba samples.  Can pull another sample if needed.  Will see how her upcoming labs look and review sugars to determine if need for insulin titration.

## 2021-10-22 ENCOUNTER — Other Ambulatory Visit: Payer: PPO

## 2021-10-26 ENCOUNTER — Telehealth: Payer: Self-pay | Admitting: Pharmacy Technician

## 2021-10-26 DIAGNOSIS — Z596 Low income: Secondary | ICD-10-CM

## 2021-10-26 NOTE — Progress Notes (Signed)
[place] [facility])                                            [place] Team    10/26/2021  Cathy Vazquez 11-18-1940 [id number]                                      Medication Assistance Referral  Referral From: [place]  Medication/Company: Tyler Aas / [employer] Patient application portion:  Education officer, museum portion: Faxed  to Dr. Einar Pheasant Provider address/fax verified via: Office website  Cathy Vazquez P. Cathy Vazquez, Cathy Vazquez  [phone]

## 2021-10-26 NOTE — Progress Notes (Signed)
I called and spoke with the patient and she state her sugars are okay they run between 180 and 190 sometimes but they are okay, she states she still have samples and I reminded her of her lab appointment and that the provider will review her labs once done and go from there and she understood. Meesha Sek,cma

## 2021-10-27 ENCOUNTER — Other Ambulatory Visit (INDEPENDENT_AMBULATORY_CARE_PROVIDER_SITE_OTHER): Payer: PPO

## 2021-10-27 DIAGNOSIS — E7800 Pure hypercholesterolemia, unspecified: Secondary | ICD-10-CM

## 2021-10-27 DIAGNOSIS — D72829 Elevated white blood cell count, unspecified: Secondary | ICD-10-CM

## 2021-10-27 DIAGNOSIS — E1165 Type 2 diabetes mellitus with hyperglycemia: Secondary | ICD-10-CM

## 2021-10-27 LAB — CBC WITH DIFFERENTIAL/PLATELET
Basophils Absolute: 0.1 10*3/uL (ref 0.0–0.1)
Basophils Relative: 0.8 % (ref 0.0–3.0)
Eosinophils Absolute: 0.2 10*3/uL (ref 0.0–0.7)
Eosinophils Relative: 2.1 % (ref 0.0–5.0)
HCT: 37.1 % (ref 36.0–46.0)
Hemoglobin: 12.4 g/dL (ref 12.0–15.0)
Lymphocytes Relative: 17.4 % (ref 12.0–46.0)
Lymphs Abs: 1.9 10*3/uL (ref 0.7–4.0)
MCHC: 33.3 g/dL (ref 30.0–36.0)
MCV: 87.5 fl (ref 78.0–100.0)
Monocytes Absolute: 0.6 10*3/uL (ref 0.1–1.0)
Monocytes Relative: 5.7 % (ref 3.0–12.0)
Neutro Abs: 8.1 10*3/uL — ABNORMAL HIGH (ref 1.4–7.7)
Neutrophils Relative %: 74 % (ref 43.0–77.0)
Platelets: 267 10*3/uL (ref 150.0–400.0)
RBC: 4.24 Mil/uL (ref 3.87–5.11)
RDW: 16.4 % — ABNORMAL HIGH (ref 11.5–15.5)
WBC: 10.9 10*3/uL — ABNORMAL HIGH (ref 4.0–10.5)

## 2021-10-27 LAB — BASIC METABOLIC PANEL
BUN: 22 mg/dL (ref 6–23)
CO2: 26 mEq/L (ref 19–32)
Calcium: 9.5 mg/dL (ref 8.4–10.5)
Chloride: 101 mEq/L (ref 96–112)
Creatinine, Ser: 1.19 mg/dL (ref 0.40–1.20)
GFR: 42.38 mL/min — ABNORMAL LOW (ref >60.00)
Glucose, Bld: 174 mg/dL — ABNORMAL HIGH (ref 70–99)
Potassium: 4.9 mEq/L (ref 3.5–5.1)
Sodium: 136 mEq/L (ref 135–145)

## 2021-10-27 LAB — HEPATIC FUNCTION PANEL
ALT: 14 U/L (ref 0–35)
AST: 12 U/L (ref 0–37)
Albumin: 4.2 g/dL (ref 3.5–5.2)
Alkaline Phosphatase: 82 U/L (ref 39–117)
Bilirubin, Direct: 0.1 mg/dL (ref 0.0–0.3)
Total Bilirubin: 0.5 mg/dL (ref 0.2–1.2)
Total Protein: 7 g/dL (ref 6.0–8.3)

## 2021-10-27 LAB — LIPID PANEL
Cholesterol: 184 mg/dL (ref 0–200)
HDL: 36 mg/dL — ABNORMAL LOW (ref >39.00)
LDL Cholesterol: 115 mg/dL — ABNORMAL HIGH (ref 0–99)
NonHDL: 148.03
Total CHOL/HDL Ratio: 5
Triglycerides: 163 mg/dL — ABNORMAL HIGH (ref 0.0–149.0)
VLDL: 32.6 mg/dL (ref 0.0–40.0)

## 2021-10-27 LAB — HEMOGLOBIN A1C: Hgb A1c MFr Bld: 8.7 % — ABNORMAL HIGH (ref 4.6–6.5)

## 2021-11-02 ENCOUNTER — Ambulatory Visit: Payer: PPO | Admitting: Internal Medicine

## 2021-11-05 ENCOUNTER — Ambulatory Visit (INDEPENDENT_AMBULATORY_CARE_PROVIDER_SITE_OTHER): Payer: PPO | Admitting: Internal Medicine

## 2021-11-05 ENCOUNTER — Encounter: Payer: Self-pay | Admitting: Internal Medicine

## 2021-11-05 DIAGNOSIS — D649 Anemia, unspecified: Secondary | ICD-10-CM

## 2021-11-05 DIAGNOSIS — E042 Nontoxic multinodular goiter: Secondary | ICD-10-CM | POA: Diagnosis not present

## 2021-11-05 DIAGNOSIS — I10 Essential (primary) hypertension: Secondary | ICD-10-CM | POA: Diagnosis not present

## 2021-11-05 DIAGNOSIS — E1165 Type 2 diabetes mellitus with hyperglycemia: Secondary | ICD-10-CM | POA: Diagnosis not present

## 2021-11-05 DIAGNOSIS — N1831 Chronic kidney disease, stage 3a: Secondary | ICD-10-CM

## 2021-11-05 DIAGNOSIS — E7800 Pure hypercholesterolemia, unspecified: Secondary | ICD-10-CM | POA: Diagnosis not present

## 2021-11-05 MED ORDER — METFORMIN HCL ER 500 MG PO TB24: 500.0000 mg | ORAL_TABLET | Freq: Every day | ORAL | 1 refills | Status: DC

## 2021-11-05 NOTE — Progress Notes (Unsigned)
Patient ID: Cathy Vazquez, female   DOB: [birth year], 81 y.o.   MRN: [id number]   Subjective:    Patient ID: Cathy Vazquez, female    DOB: [birth year], 81 y.o.   MRN: [id number]   Patient here for No chief complaint on file.  [place] Kitchen   HPI    Past Medical History:  Diagnosis Date   Anemia    Diverticulosis    GERD (gastroesophageal reflux disease)    Glaucoma    Hypercholesterolemia    Hypertension    Thyroid nodule    Past Surgical History:  Procedure Laterality Date   ABDOMINAL HYSTERECTOMY  1987   fibroids, ovaries not removed   TONSILLECTOMY     Family History  Problem Relation Age of Onset   Hypertension Mother    Hypercholesterolemia Mother    Rheumatic fever Mother    Diabetes Father    Hypertension Father    Hypercholesterolemia Father    Heart disease Father        CABG   Diabetes Sister    Hypertension Brother    Breast cancer Maternal Aunt    Breast cancer Cousin    Colon cancer Neg Hx    Social History   Socioeconomic History   Marital status: Divorced    Spouse name: Not on file   Number of children: 2   Years of education: Not on file   Highest education level: Not on file  Occupational History   Not on file  Tobacco Use   Smoking status: Never   Smokeless tobacco: Never  Vaping Use   Vaping Use: Never used  Substance and Sexual Activity   Alcohol use: No    Alcohol/week: 0.0 standard drinks of alcohol   Drug use: No   Sexual activity: Not Currently  Other Topics Concern   Not on file  Social History Narrative   Not on file   Social Determinants of Health   Financial Resource Strain: Low Risk  (05/18/2021)   Overall Financial Resource Strain (CARDIA)    Difficulty of Paying Living Expenses: Not hard at all  Food Insecurity: No Food Insecurity (05/24/2021)   Hunger Vital Sign    Worried About Running Out of Food in the Last Year: Never true    Ran Out of Food in the Last Year: Never true  Transportation Needs: No  Transportation Needs (05/18/2021)   PRAPARE - Hydrologist (Medical): No    Lack of Transportation (Non-Medical): No  Physical Activity: Unknown (05/11/2020)   Exercise Vital Sign    Days of Exercise per Week: 0 days    Minutes of Exercise per Session: Not on file  Stress: No Stress Concern Present (05/11/2020)   [place]    Feeling of Stress : Not at all  Social Connections: Unknown (05/18/2021)   Social Connection and Isolation Panel [NHANES]    Frequency of Communication with Friends and Family: More than three times a week    Frequency of Social Gatherings with Friends and Family: More than three times a week    Attends Religious Services: Not on file    Active Member of Clubs or Organizations: Not on file    Attends Archivist Meetings: Not on file    Marital Status: Not on file     Review of Systems     Objective:     LMP 02/21/1984  Wt Readings from Last 3 Encounters:  10/01/21 179 lb (81.2 kg)  09/17/21 183 lb 6.4 oz (83.2 kg)  09/06/21 180 lb (81.6 kg)    Physical Exam   Outpatient Encounter Medications as of 11/05/2021  Medication Sig   acetaminophen (TYLENOL) 325 MG tablet Take 650 mg by mouth every 4 (four) hours as needed.   Alpha-Lipoic Acid 200 MG CAPS Take 200 mg by mouth daily.   Bioflavonoid Products (BIOFLEX PO) Take 1 tablet by mouth daily.    Continuous Blood Gluc Receiver (FREESTYLE LIBRE 2 READER) DEVI 1 application. by Does not apply route every 14 (fourteen) days.   Continuous Blood Gluc Sensor (FREESTYLE LIBRE 2 SENSOR) MISC 1 application. by Does not apply route every 14 (fourteen) days.   fish oil-omega-3 fatty acids 1000 MG capsule Take 1 capsule (1 g total) by mouth daily.   insulin degludec (TRESIBA FLEXTOUCH) 100 UNIT/ML FlexTouch Pen Inject 10 Units into the skin daily.   Insulin Pen Needle (PEN NEEDLES) 32G X 4 MM MISC Used to give daily  insulin injections.   Multiple Vitamin (MULTIVITAMIN) tablet Take 1 tablet by mouth daily.   olmesartan (BENICAR) 20 MG tablet Take 1 tablet (20 mg total) by mouth daily.   pantoprazole (PROTONIX) 40 MG tablet TAKE 1 TABLET BY MOUTH EVERY DAY   trimethoprim-polymyxin b (POLYTRIM) ophthalmic solution Place 1 drop into both eyes every 4 (four) hours. 1 drop into both eyes every 4 hours while awake x7 days.   Facility-Administered Encounter Medications as of 11/05/2021  Medication   cyanocobalamin ((VITAMIN B-12)) injection 1,000 mcg   cyanocobalamin ((VITAMIN B-12)) injection 1,000 mcg     Lab Results  Component Value Date   WBC 10.9 (H) 10/27/2021   HGB 12.4 10/27/2021   HCT 37.1 10/27/2021   PLT 267.0 10/27/2021   GLUCOSE 174 (H) 10/27/2021   CHOL 184 10/27/2021   TRIG 163.0 (H) 10/27/2021   HDL 36.00 (L) 10/27/2021   LDLDIRECT 130.0 07/15/2021   LDLCALC 115 (H) 10/27/2021   ALT 14 10/27/2021   AST 12 10/27/2021   NA 136 10/27/2021   K 4.9 10/27/2021   CL 101 10/27/2021   CREATININE 1.19 10/27/2021   BUN 22 10/27/2021   CO2 26 10/27/2021   TSH 3.34 07/15/2021   HGBA1C 8.7 (H) 10/27/2021   MICROALBUR 3.3 (H) 07/29/2021       Assessment & Plan:   Problem List Items Addressed This Visit   None    Einar Pheasant, MD

## 2021-11-07 ENCOUNTER — Encounter: Payer: Self-pay | Admitting: Internal Medicine

## 2021-11-07 NOTE — Assessment & Plan Note (Signed)
Avoid antiinflammatories.  Follow metabolic panel.  Continue benicar. Discussed possibility of starting farxiga.  She wants to hold at this time.

## 2021-11-07 NOTE — Assessment & Plan Note (Signed)
On benicar.  Blood pressures as outlined.  No changes.   Follow metabolic panel.  Follow pressures.

## 2021-11-07 NOTE — Assessment & Plan Note (Signed)
Follow cbc.  

## 2021-11-07 NOTE — Assessment & Plan Note (Signed)
D[facility] Lipid diet.  Have discussed calculated cholesterol risk and need for cholesterol medication.  Declines.  Follow lipid panel.  

## 2021-11-07 NOTE — Assessment & Plan Note (Signed)
a1c - 8.7.  Has previously declined medication.  Agreed to tresiba when sugars >300.  Sugars have improved, but still seeing sugars >200.  Discussed low carb diet and exercise.  Start metformin.  Send in sugar readings over the next few weeks.  Will continue to adjust medication.  May need to titrate tresiba.

## 2021-11-07 NOTE — Assessment & Plan Note (Signed)
Has seen endocrinology previously.  Follow tsh. Needs f/u thyroid ultrasound.   

## 2021-11-11 ENCOUNTER — Telehealth: Payer: Self-pay | Admitting: Internal Medicine

## 2021-11-11 NOTE — Telephone Encounter (Signed)
Order placed for f/u lab.   

## 2021-11-11 NOTE — Addendum Note (Signed)
Addended by: Alisa Graff on: 11/11/2021 10:32 PM   Modules accepted: Orders

## 2021-11-11 NOTE — Telephone Encounter (Signed)
Patient has a lab appt 11/19/21, there are no orders in.

## 2021-11-17 ENCOUNTER — Telehealth: Payer: Self-pay | Admitting: Pharmacy Technician

## 2021-11-17 DIAGNOSIS — Z596 Low income: Secondary | ICD-10-CM

## 2021-11-17 NOTE — Progress Notes (Addendum)
[place] [facility])                                            [place] Team    ADDENDUM-12/15/2021 Care coordination cal placed to [employer] I regard to [name] application and was informed updated proof of income was needed. Was able to resubmit application with updated proof of income.  Javohn Basey P. Jonte Wollam, [place]  [phone]  ADDENDUM-11/26/2021 Care coordination call placed to [place] in regard to [name] application and was informed information was missing off the application. Was able to resubmit application with information that was missing.  Erynne Kealey P. Meeyah Ovitt, [place]  [phone]     11/17/2021  Cathy Vazquez 08-Jul-1940 [id number]  Received both patient and provider portion(s) of patient assistance application(s) for [name]. Faxed completed application and required documents into [employer].   Cathy Vazquez P. Yaroslav Gombos, [place]  [phone]

## 2021-11-19 ENCOUNTER — Other Ambulatory Visit: Payer: PPO

## 2021-11-22 ENCOUNTER — Other Ambulatory Visit (INDEPENDENT_AMBULATORY_CARE_PROVIDER_SITE_OTHER): Payer: PPO

## 2021-11-22 DIAGNOSIS — E1165 Type 2 diabetes mellitus with hyperglycemia: Secondary | ICD-10-CM | POA: Diagnosis not present

## 2021-11-23 LAB — BASIC METABOLIC PANEL
BUN: 26 mg/dL — ABNORMAL HIGH (ref 6–23)
CO2: 26 mEq/L (ref 19–32)
Calcium: 9.4 mg/dL (ref 8.4–10.5)
Chloride: 101 mEq/L (ref 96–112)
Creatinine, Ser: 1.45 mg/dL — ABNORMAL HIGH (ref 0.40–1.20)
GFR: 33.41 mL/min — ABNORMAL LOW (ref >60.00)
Glucose, Bld: 102 mg/dL — ABNORMAL HIGH (ref 70–99)
Potassium: 5.1 mEq/L (ref 3.5–5.1)
Sodium: 138 mEq/L (ref 135–145)

## 2021-11-24 ENCOUNTER — Other Ambulatory Visit: Payer: Self-pay

## 2021-11-24 DIAGNOSIS — R944 Abnormal results of kidney function studies: Secondary | ICD-10-CM

## 2021-11-28 ENCOUNTER — Other Ambulatory Visit: Payer: Self-pay | Admitting: Internal Medicine

## 2021-12-03 ENCOUNTER — Other Ambulatory Visit (INDEPENDENT_AMBULATORY_CARE_PROVIDER_SITE_OTHER): Payer: PPO

## 2021-12-03 DIAGNOSIS — R944 Abnormal results of kidney function studies: Secondary | ICD-10-CM | POA: Diagnosis not present

## 2021-12-03 LAB — BASIC METABOLIC PANEL
BUN: 17 mg/dL (ref 6–23)
CO2: 26 mEq/L (ref 19–32)
Calcium: 9.3 mg/dL (ref 8.4–10.5)
Chloride: 102 mEq/L (ref 96–112)
Creatinine, Ser: 1.05 mg/dL (ref 0.40–1.20)
GFR: 49.21 mL/min — ABNORMAL LOW (ref >60.00)
Glucose, Bld: 158 mg/dL — ABNORMAL HIGH (ref 70–99)
Potassium: 4.7 mEq/L (ref 3.5–5.1)
Sodium: 136 mEq/L (ref 135–145)

## 2021-12-09 ENCOUNTER — Telehealth: Payer: Self-pay | Admitting: Internal Medicine

## 2021-12-09 NOTE — Telephone Encounter (Signed)
Patient returned your call . I read the provider note. She voiced understanding. Will record blood sugars and keep upcoming appointment.

## 2021-12-09 NOTE — Telephone Encounter (Signed)
Noted  

## 2021-12-09 NOTE — Telephone Encounter (Signed)
Reviewed blood sugars that pt dropped off (most averaging 140-190s).  Overall improved.  Have her to continue to monitor sugars and record.  Keep scheduled appt.

## 2021-12-09 NOTE — Telephone Encounter (Signed)
LMTCB

## 2021-12-17 ENCOUNTER — Telehealth: Payer: Self-pay | Admitting: Pharmacy Technician

## 2021-12-17 DIAGNOSIS — Z596 Low income: Secondary | ICD-10-CM

## 2021-12-17 NOTE — Progress Notes (Signed)
[facility])                                            [place] Team    12/17/2021  Cathy Vazquez 28-May-1940 [id number]  Care coordination call placed to [name] in regard to [name] application.  Spoke to [name] who informs patient is APPROVED 12/17/21-02/20/22. She informs 1 box of Tresiba and 2 boxes of pen needles will be delivered to the provider's office in the next 10-14 business days. Patient is aware of her approval.  Krystan Northrop P. Jlyn Cerros, [place]  [phone]

## 2021-12-23 ENCOUNTER — Ambulatory Visit: Payer: PPO | Admitting: Internal Medicine

## 2021-12-24 ENCOUNTER — Telehealth: Payer: Self-pay | Admitting: Pharmacist

## 2021-12-24 ENCOUNTER — Telehealth: Payer: Self-pay | Admitting: Pharmacy Technician

## 2021-12-24 DIAGNOSIS — Z596 Low income: Secondary | ICD-10-CM

## 2021-12-24 NOTE — Progress Notes (Signed)
[place] [facility]) Quality  [facility] CM Quality   12/24/2021  Cathy Vazquez 1940/09/27 [id number]  Reason for referral: medication assistance  Referral source: [facility] Quality Team Referral medication(s): Tyler Aas Current insurance:[employer]age  Medication Assistance Findings:  Medication assistance needs identified: Spoke with patient over the phone. HIPAA identifiers were obtained.  Patient received Tresiba through [employer] Patient Assistance Program for 2023. She communicated that she is still using [name] and will be in need of assistance in 2024.  She appears to still quality for the program.  [place] Technician will complete the process.     Additional medication assistance options reviewed with patient as warranted:  No other options identified  Plan: I will route patient assistance letter to [place] technician who will coordinate patient assistance program application process for medications listed above.  [facility] pharmacy technician will assist with obtaining all required documents from both patient and provider(s) and submit application(s) once completed.     Elayne Guerin, PharmD, [place] Clinical Pharmacist [phone]

## 2021-12-24 NOTE — Progress Notes (Signed)
[facility])                                            [place] Team    12/24/2021  Dicy Smigel 1940/08/09 [id number]                                      Medication Assistance Referral-FOR 2024 re enrollment  Referral From: [employer]  Medication/Company: Tyler Aas / [employer] Patient application portion:  Education officer, museum portion: Faxed  to Dr. Einar Pheasant Provider address/fax verified via: Office website  Constance Hackenberg P. Starla Deller, [place]  [phone]

## 2022-01-02 ENCOUNTER — Encounter: Payer: Self-pay | Admitting: Internal Medicine

## 2022-01-07 ENCOUNTER — Telehealth: Payer: Self-pay

## 2022-01-07 NOTE — Telephone Encounter (Signed)
Pt came in to pick up medication Tresiba and needles from Patient assistance @ 10:00 am on 01/07/22

## 2022-01-18 ENCOUNTER — Telehealth: Payer: Self-pay

## 2022-01-18 ENCOUNTER — Ambulatory Visit (INDEPENDENT_AMBULATORY_CARE_PROVIDER_SITE_OTHER): Payer: PPO | Admitting: Internal Medicine

## 2022-01-18 ENCOUNTER — Encounter: Payer: Self-pay | Admitting: Internal Medicine

## 2022-01-18 VITALS — BP 158/82 | HR 84 | Temp 97.8°F | Resp 17 | Ht 63.0 in | Wt 176.2 lb

## 2022-01-18 DIAGNOSIS — D649 Anemia, unspecified: Secondary | ICD-10-CM

## 2022-01-18 DIAGNOSIS — N1831 Chronic kidney disease, stage 3a: Secondary | ICD-10-CM

## 2022-01-18 DIAGNOSIS — D72829 Elevated white blood cell count, unspecified: Secondary | ICD-10-CM

## 2022-01-18 DIAGNOSIS — I10 Essential (primary) hypertension: Secondary | ICD-10-CM | POA: Diagnosis not present

## 2022-01-18 DIAGNOSIS — E7800 Pure hypercholesterolemia, unspecified: Secondary | ICD-10-CM | POA: Diagnosis not present

## 2022-01-18 DIAGNOSIS — E1165 Type 2 diabetes mellitus with hyperglycemia: Secondary | ICD-10-CM

## 2022-01-18 LAB — BASIC METABOLIC PANEL
BUN: 20 mg/dL (ref 6–23)
CO2: 29 mEq/L (ref 19–32)
Calcium: 9.7 mg/dL (ref 8.4–10.5)
Chloride: 99 mEq/L (ref 96–112)
Creatinine, Ser: 1.03 mg/dL (ref 0.40–1.20)
GFR: 50.31 mL/min — ABNORMAL LOW (ref >60.00)
Glucose, Bld: 179 mg/dL — ABNORMAL HIGH (ref 70–99)
Potassium: 4.5 mEq/L (ref 3.5–5.1)
Sodium: 136 mEq/L (ref 135–145)

## 2022-01-18 NOTE — Telephone Encounter (Signed)
Patient states Dr. Einar Pheasant wanted to know the name of her blood pressure medicine.  Patient states it is Ofmesartan '20MG'$  (Medoxomil)

## 2022-01-18 NOTE — Telephone Encounter (Signed)
Notify her I want to increase omesartan to '40mg'$  q day instead of '20mg'$  q day.  Will need new rx sent in. Also take off her allergy list.  Was listed as intolerance - but has been on for years.

## 2022-01-18 NOTE — Progress Notes (Signed)
Patient ID: Cathy Vazquez, female   DOB: [birth year], 81 y.o.   MRN: [id number]   Subjective:    Patient ID: Cathy Vazquez, female    DOB: 1940/05/29, 81 y.o.   MRN: [id number]   Patient here for  Chief Complaint  Patient presents with   Follow-up    4 week follow-up on BP & DM   Hypertension   Diabetes   .   HPI Here to follow up regarding her diabetes, hypertension and hypercholesterolemia.  Reports she is doing relatively well. Does report that in am - legs and feet hurt.  She is slow moving when first gets up.  Around mid morning feels better. Energy better.  No chest pain or sob reported.  No abdominal pain or bowel issues reported.  Not taking metformin.  Only on tresiba.  Blood sugars remaining 200s on multiple checks.  Discussed medications for her sugars.  Discussed farxiga.  Blood pressures remaining elevated.     Past Medical History:  Diagnosis Date   Anemia    Diverticulosis    GERD (gastroesophageal reflux disease)    Glaucoma    Hypercholesterolemia    Hypertension    Thyroid nodule    Past Surgical History:  Procedure Laterality Date   ABDOMINAL HYSTERECTOMY  1987   fibroids, ovaries not removed   TONSILLECTOMY     Family History  Problem Relation Age of Onset   Hypertension Mother    Hypercholesterolemia Mother    Rheumatic fever Mother    Diabetes Father    Hypertension Father    Hypercholesterolemia Father    Heart disease Father        CABG   Diabetes Sister    Hypertension Brother    Breast cancer Maternal Aunt    Breast cancer Cousin    Colon cancer Neg Hx    Social History   Socioeconomic History   Marital status: Divorced    Spouse name: Not on file   Number of children: 2   Years of education: Not on file   Highest education level: Not on file  Occupational History   Not on file  Tobacco Use   Smoking status: Never   Smokeless tobacco: Never  Vaping Use   Vaping Use: Never used  Substance and Sexual Activity    Alcohol use: No    Alcohol/week: 0.0 standard drinks of alcohol   Drug use: No   Sexual activity: Not Currently  Other Topics Concern   Not on file  Social History Narrative   Not on file   Social Determinants of Health   Financial Resource Strain: Low Risk  (05/18/2021)   Overall Financial Resource Strain (CARDIA)    Difficulty of Paying Living Expenses: Not hard at all  Food Insecurity: No Food Insecurity (05/24/2021)   Hunger Vital Sign    Worried About Running Out of Food in the Last Year: Never true    Ran Out of Food in the Last Year: Never true  Transportation Needs: No Transportation Needs (05/18/2021)   PRAPARE - Hydrologist (Medical): No    Lack of Transportation (Non-Medical): No  Physical Activity: Unknown (05/11/2020)   Exercise Vital Sign    Days of Exercise per Week: 0 days    Minutes of Exercise per Session: Not on file  Stress: No Stress Concern Present (05/11/2020)   [place]    Feeling of Stress : Not at all  Social Connections: Unknown (05/18/2021)   Social Connection and Isolation Panel [NHANES]    Frequency of Communication with Friends and Family: More than three times a week    Frequency of Social Gatherings with Friends and Family: More than three times a week    Attends Religious Services: Not on Advertising copywriter or Organizations: Not on file    Attends Archivist Meetings: Not on file    Marital Status: Not on file     Review of Systems  Constitutional:  Negative for appetite change and unexpected weight change.  HENT:  Negative for congestion and sinus pressure.   Respiratory:  Negative for cough, chest tightness and shortness of breath.   Cardiovascular:  Negative for chest pain and palpitations.       No increased swelling.   Gastrointestinal:  Negative for abdominal pain, diarrhea, nausea and vomiting.  Genitourinary:  Negative  for difficulty urinating and dysuria.  Musculoskeletal:  Negative for joint swelling and myalgias.  Skin:  Negative for color change and rash.  Neurological:  Negative for dizziness and headaches.  Psychiatric/Behavioral:  Negative for agitation and dysphoric mood.        Objective:     BP (!) 158/82   Pulse 84   Temp 97.8 F (36.6 C) (Oral)   Resp 17   Ht '5\' 3"'$  (1.6 m)   Wt 176 lb 4 oz (79.9 kg)   LMP 02/21/1984   BMI 31.22 kg/m  Wt Readings from Last 3 Encounters:  01/18/22 176 lb 4 oz (79.9 kg)  11/05/21 177 lb 6.4 oz (80.5 kg)  10/01/21 179 lb (81.2 kg)    Physical Exam Vitals reviewed.  Constitutional:      General: She is not in acute distress.    Appearance: Normal appearance.  HENT:     Head: Normocephalic and atraumatic.     Right Ear: External ear normal.     Left Ear: External ear normal.  Eyes:     General: No scleral icterus.       Right eye: No discharge.        Left eye: No discharge.     Conjunctiva/sclera: Conjunctivae normal.  Neck:     Thyroid: No thyromegaly.  Cardiovascular:     Rate and Rhythm: Normal rate and regular rhythm.  Pulmonary:     Effort: No respiratory distress.     Breath sounds: Normal breath sounds. No wheezing.  Abdominal:     General: Bowel sounds are normal.     Palpations: Abdomen is soft.     Tenderness: There is no abdominal tenderness.  Musculoskeletal:        General: No swelling or tenderness.     Cervical back: Neck supple. No tenderness.  Lymphadenopathy:     Cervical: No cervical adenopathy.  Skin:    Findings: No erythema or rash.  Neurological:     Mental Status: She is alert.  Psychiatric:        Mood and Affect: Mood normal.        Behavior: Behavior normal.      Outpatient Encounter Medications as of 01/18/2022  Medication Sig   acetaminophen (TYLENOL) 325 MG tablet Take 650 mg by mouth every 4 (four) hours as needed.   Alpha-Lipoic Acid 200 MG CAPS Take 200 mg by mouth daily.   Bioflavonoid  Products (BIOFLEX PO) Take 1 tablet by mouth daily.    Continuous Blood Gluc Receiver (FREESTYLE LIBRE 2 READER)  DEVI 1 application. by Does not apply route every 14 (fourteen) days.   Continuous Blood Gluc Sensor (FREESTYLE LIBRE 2 SENSOR) MISC 1 application. by Does not apply route every 14 (fourteen) days.   fish oil-omega-3 fatty acids 1000 MG capsule Take 1 capsule (1 g total) by mouth daily.   insulin degludec (TRESIBA FLEXTOUCH) 100 UNIT/ML FlexTouch Pen Inject 10 Units into the skin daily.   Insulin Pen Needle (PEN NEEDLES) 32G X 4 MM MISC Used to give daily insulin injections.   Multiple Vitamin (MULTIVITAMIN) tablet Take 1 tablet by mouth daily.   pantoprazole (PROTONIX) 40 MG tablet TAKE 1 TABLET BY MOUTH EVERY DAY   [DISCONTINUED] olmesartan (BENICAR) 20 MG tablet Take 1 tablet (20 mg total) by mouth daily.   [DISCONTINUED] metFORMIN (GLUCOPHAGE-XR) 500 MG 24 hr tablet TAKE 1 TABLET BY MOUTH EVERY DAY WITH BREAKFAST (Patient not taking: Reported on 01/18/2022)   [DISCONTINUED] trimethoprim-polymyxin b (POLYTRIM) ophthalmic solution Place 1 drop into both eyes every 4 (four) hours. 1 drop into both eyes every 4 hours while awake x7 days. (Patient not taking: Reported on 01/18/2022)   Facility-Administered Encounter Medications as of 01/18/2022  Medication   cyanocobalamin ((VITAMIN B-12)) injection 1,000 mcg   [DISCONTINUED] cyanocobalamin ((VITAMIN B-12)) injection 1,000 mcg     Lab Results  Component Value Date   WBC 10.9 (H) 10/27/2021   HGB 12.4 10/27/2021   HCT 37.1 10/27/2021   PLT 267.0 10/27/2021   GLUCOSE 179 (H) 01/18/2022   CHOL 184 10/27/2021   TRIG 163.0 (H) 10/27/2021   HDL 36.00 (L) 10/27/2021   LDLDIRECT 130.0 07/15/2021   LDLCALC 115 (H) 10/27/2021   ALT 14 10/27/2021   AST 12 10/27/2021   NA 136 01/18/2022   K 4.5 01/18/2022   CL 99 01/18/2022   CREATININE 1.03 01/18/2022   BUN 20 01/18/2022   CO2 29 01/18/2022   TSH 3.34 07/15/2021   HGBA1C 8.7 (H)  10/27/2021   MICROALBUR 3.3 (H) 07/29/2021       Assessment & Plan:   Problem List Items Addressed This Visit     Anemia    Follow cbc.       CKD (chronic kidney disease) stage 3, GFR 30-59 ml/min (HCC)    Avoid antiinflammatories.  Follow metabolic panel.  Continue benicar. Increase dose as outlined.  Discussed possibility of starting farxiga.  She wants to hold at this time. Discussed referral to pharmacy ([facility]).  Agreeable.        Hypercholesteremia    [place] Lipid diet.  Have discussed calculated cholesterol risk and need for cholesterol medication.  Declines.  Follow lipid panel.       Relevant Orders   Hepatic function panel   Lipid panel   Hypertension - Primary    On benicar.  Increase to '40mg'$  q day.  Follow metabolic panel.  Follow pressures.  Get her back in soon to reassess.       Relevant Orders   Basic metabolic panel (Completed)   AMB Referral to Pharmacy Medication Management   Leukocytosis    White blood cell count elevated on recent check.  Recheck cbc to confirm stable/normal.       Relevant Orders   CBC with Differential/Platelet   Type 2 diabetes mellitus with hyperglycemia ([place])    a1c - 8.7 las check. Has previously declined medication.  Agreed to tresiba when sugars >300.  Sugars have improved, but still seeing sugars >200.  Discussed low carb diet and exercise.  Off metformin. Intolerance. Discussed adding farxiga and titrating tresiba.  Discussed referral to [facility].  She is agreeable.       Relevant Orders   AMB Referral to Pharmacy Medication Management   Basic metabolic panel   Hemoglobin A1c     Einar Pheasant, MD

## 2022-01-19 ENCOUNTER — Other Ambulatory Visit: Payer: Self-pay

## 2022-01-19 MED ORDER — OLMESARTAN MEDOXOMIL 40 MG PO TABS: 40.0000 mg | ORAL_TABLET | Freq: Every day | ORAL | 3 refills | Status: DC

## 2022-01-19 NOTE — Telephone Encounter (Signed)
Lm for pt to cb  Rx olmesartan '40mg'$  sent

## 2022-01-19 NOTE — Telephone Encounter (Signed)
Patient called office back, note was read.

## 2022-01-23 ENCOUNTER — Encounter: Payer: Self-pay | Admitting: Internal Medicine

## 2022-01-23 NOTE — Assessment & Plan Note (Signed)
Follow cbc.  

## 2022-01-23 NOTE — Assessment & Plan Note (Signed)
[place] Lipid diet.  Have discussed calculated cholesterol risk and need for cholesterol medication.  Declines.  Follow lipid panel.

## 2022-01-23 NOTE — Assessment & Plan Note (Signed)
White blood cell count elevated on recent check.  Recheck cbc to confirm stable/normal.

## 2022-01-23 NOTE — Assessment & Plan Note (Signed)
Avoid antiinflammatories.  Follow metabolic panel.  Continue benicar. Increase dose as outlined.  Discussed possibility of starting farxiga.  She wants to hold at this time. Discussed referral to pharmacy ([facility]).  Agreeable.

## 2022-01-23 NOTE — Assessment & Plan Note (Addendum)
a1c - 8.7 las check. Has previously declined medication.  Agreed to tresiba when sugars >300.  Sugars have improved, but still seeing sugars >200.  Discussed low carb diet and exercise.  Off metformin. Intolerance. Discussed adding farxiga and titrating tresiba.  Discussed referral to [facility].  She is agreeable.

## 2022-01-23 NOTE — Assessment & Plan Note (Signed)
On benicar.  Increase to '40mg'$  q day.  Follow metabolic panel.  Follow pressures.  Get her back in soon to reassess.

## 2022-01-27 ENCOUNTER — Telehealth: Payer: Self-pay

## 2022-01-27 NOTE — Progress Notes (Signed)
   Care Guide Note  01/27/2022 Name: Cathy Vazquez MRN: [id number] DOB: 03-03-1940  Referred by: Einar Pheasant, MD Reason for referral : Care Coordination (Outreach to schedule referral with Pharm D )   Cathy Vazquez is a 81 y.o. year old female who is a primary care patient of Einar Pheasant, MD. Cathy Vazquez was referred to the pharmacist for assistance related to DM.    An unsuccessful telephone outreach was attempted today to contact the patient who was referred to the pharmacy team for assistance with medication management. Additional attempts will be made to contact the patient.   Cathy Vazquez, [place], [place] [address] Direct Dial: [phone] [email or web address]

## 2022-01-30 ENCOUNTER — Telehealth: Payer: Self-pay | Admitting: Pharmacy Technician

## 2022-01-30 DIAGNOSIS — Z596 Low income: Secondary | ICD-10-CM

## 2022-01-30 NOTE — Progress Notes (Signed)
[facility])                                            [place] Team    01/30/2022  Cathy Vazquez 04-08-1940 [id number]  Received both patient and provider portion(s) of patient assistance application(s) for [name]. Faxed completed application and required documents into [employer].    [name] [name], [place]  [phone]

## 2022-02-03 NOTE — Progress Notes (Signed)
   Care Guide Note  02/03/2022 Name: Cathy Vazquez MRN: [id number] DOB: 1941-01-27  Referred by: Einar Pheasant, MD Reason for referral : Care Coordination (Outreach to schedule referral with Pharm D )   Cathy Vazquez is a 81 y.o. year old female who is a primary care patient of Einar Pheasant, MD. Cathy Vazquez was referred to the pharmacist for assistance related to DM.    Successful contact was made with the patient to discuss pharmacy services including being ready for the pharmacist to call at least 5 minutes before the scheduled appointment time, to have medication bottles and any blood sugar or blood pressure readings ready for review. The patient agreed to meet with the pharmacist via with the pharmacist via telephone visit on (date/time).  03/02/2022  Cathy Vazquez, [place], [place] [address] Direct Dial: [phone] [email or web address]

## 2022-03-02 ENCOUNTER — Other Ambulatory Visit: Payer: PPO | Admitting: Pharmacist

## 2022-03-02 ENCOUNTER — Telehealth: Payer: Self-pay | Admitting: Pharmacy Technician

## 2022-03-02 ENCOUNTER — Other Ambulatory Visit (HOSPITAL_COMMUNITY): Payer: Self-pay

## 2022-03-02 ENCOUNTER — Other Ambulatory Visit: Payer: Self-pay

## 2022-03-02 DIAGNOSIS — E1165 Type 2 diabetes mellitus with hyperglycemia: Secondary | ICD-10-CM

## 2022-03-02 DIAGNOSIS — Z596 Low income: Secondary | ICD-10-CM

## 2022-03-02 MED ORDER — DAPAGLIFLOZIN PROPANEDIOL 5 MG PO TABS
5.0000 mg | ORAL_TABLET | Freq: Every day | ORAL | 2 refills | Status: DC
  Filled 2022-03-02: qty 30, 30d supply, fill #0

## 2022-03-02 MED ORDER — FREESTYLE LIBRE 2 SENSOR MISC
1.0000 "application " | 3 refills | Status: DC
  Filled 2022-03-02: qty 6, 84d supply, fill #0

## 2022-03-02 NOTE — Progress Notes (Signed)
[facility])                                            [place] Team    03/02/2022  Cathy Vazquez 11-03-1940 [id number]  Care coordination call placed to [place] in regard to [name] application.  Spoke to [name] who informs patient is APPROVED 02/21/22-02/21/23. She informs medication will ship based on last fill date in 2023 and be delivered to the prescriber's office.  [name] P. [name], [place]  [phone]

## 2022-03-02 NOTE — Progress Notes (Signed)
03/02/2022 Name: Cathy Vazquez MRN: [id number] DOB: [birth year]  Chief Complaint  Patient presents with   Medication Management    Diabetes, hypertension    Cathy Vazquez is a 82 y.o. year old female who presented for a telephone visit.   They were referred to the pharmacist by their PCP for assistance in managing diabetes and hypertension.   Subjective:  Care Team: Primary Care Provider: Einar Pheasant, MD ; Next Scheduled Visit: 03/11/2021  Medication Access/Adherence  Current Pharmacy:  [id number] [id number]: [phone]: [email or web address]: [phone]: [phone]  Patient reports affordability concerns with their medications: Yes , total monthly income is $1,225 Patient reports access/transportation concerns to their pharmacy: Yes , does not have car. Rarely drives after hip surgery.  Patient reports adherence concerns with their medications:  No     Diabetes:  Current medications: Tresiba 10 units daily  Patient has FreeStyle Libre 2 but does not have compatible smart phone for [employer]  Most recent readings:  02/16/22: 200, 168, 195 02/17/22: 238, 183, 232 02/21/22: 141, 229 02/23/22: 250  Patient denies hypoglycemic s/sx including dizziness, shakiness, sweating. Patient denies hyperglycemic symptoms including polyuria, polydipsia, polyphagia, nocturia, neuropathy, blurred vision.  Current medication access support: Tresiba PAP  Hypertension:  Current medications: olmesartan 40 mg daily   Patient has a validated, automated, upper arm home BP cuff Current blood pressure readings readings: 120-140s/70-80s  Patient denies hypotensive s/sx including dizziness, lightheadedness.  Patient denies hypertensive symptoms including headache, chest pain, shortness of  breath  Current physical activity: limited by hip pain and age   Health Maintenance  Health Maintenance Due  Topic Date Due   COVID-19 Vaccine (1) Never done     Objective: Lab Results  Component Value Date   HGBA1C 8.7 (H) 10/27/2021    Lab Results  Component Value Date   CREATININE 1.03 01/18/2022   BUN 20 01/18/2022   NA 136 01/18/2022   K 4.5 01/18/2022   CL 99 01/18/2022   CO2 29 01/18/2022    Lab Results  Component Value Date   CHOL 184 10/27/2021   HDL 36.00 (L) 10/27/2021   LDLCALC 115 (H) 10/27/2021   LDLDIRECT 130.0 07/15/2021   TRIG 163.0 (H) 10/27/2021   CHOLHDL 5 10/27/2021    Medications Reviewed Today     Reviewed by SPauletta Browns [place](Pharmacist) on 03/02/22 at 197 Med List Status: <None>   Medication Order Taking? Sig Documenting Provider Last Dose Status Informant  acetaminophen (TYLENOL) 325 MG tablet [id number] Take 650 mg by mouth every 4 (four) hours as needed. [provider] Taking Active Self  Alpha-Lipoic Acid 200 MG CAPS [id number] Take 200 mg by mouth daily. [provider] Taking Active Self  Bioflavonoid Products (BIOFLEX PO) [id number] Take 1 tablet by mouth daily.  [provider] Taking Active Self  Continuous Blood Gluc Receiver (FREESTYLE LIBRE 2 READER) DEVI [id number] 1 application. by Does not apply route every 14 (fourteen) days. SEinar Pheasant MD Taking Active   Continuous Blood Gluc Sensor (FREESTYLE LIBRE 2 SENSOR) [id number] 1 application. by Does not apply route every 14 (fourteen) days. SEinar Pheasant MD Taking Active   cyanocobalamin ((VITAMIN B-12)) injection 1,000 mcg [id number]  SEinar Pheasant MD  Active   fish oil-omega-3  fatty acids 1000 MG capsule [id number] Yes Take 1 capsule (1 g total) by mouth daily. Einar Pheasant, MD Taking Active   insulin degludec [facility]) 100 UNIT/ML FlexTouch Pen [id number] Yes Inject 10 Units into the skin daily. Einar Pheasant, MD Taking Active   Insulin Pen Needle (PEN NEEDLES) 32G X 4 MM MISC [id number]  Used to give daily insulin injections. Einar Pheasant, MD  Active   Multiple Vitamin (MULTIVITAMIN) tablet [id number] Yes Take 1 tablet by mouth daily. [provider] Taking Active Self  olmesartan (BENICAR) 40 MG tablet [id number] Yes Take 1 tablet (40 mg total) by mouth daily. Einar Pheasant, MD Taking Active   pantoprazole (PROTONIX) 40 MG tablet [id number] Yes TAKE 1 TABLET BY MOUTH EVERY DAY Einar Pheasant, MD Taking Active              Assessment/Plan:   Diabetes: - Currently uncontrolled based on A1c (8.7% 10/2021) - Reviewed long term cardiovascular and renal outcomes of uncontrolled blood sugar - Reviewed goal A1c, goal fasting, and goal 2 hour post prandial glucose - Recommend to start Farxiga 5 mg daily given concurrent CKD.  - Recommend to check glucose continuously with CGM. - Meets financial criteria for [place] patient assistance program through [place]. Will collaborate with provider, CPhT, and patient to pursue assistance.    Hypertension: - Currently mostly controlled per patient report. Home readings range 120-140s/70-80s.  - Reviewed long term cardiovascular and renal outcomes of uncontrolled blood pressure - Reviewed appropriate blood pressure monitoring technique and reviewed goal blood pressure. Recommended to check home blood pressure and heart rate daily - Recommend to continue current treatment. Expect some BP improvement with the addition of [place].  Follow Up Plan:  Lab visit: 03/08/22 PCP visit: 03/11/2022 Pharmacists: 03/29/22  Joseph Art, Pharm.D. PGY-2 Ambulatory Care Pharmacy Resident 03/02/2022 1:46 PM

## 2022-03-02 NOTE — Progress Notes (Signed)
Discussed with resident, agree with plan. Farxiga 5 mg ordered to [facility]. Order for olmesartan also sent for mail order.   Catie Hedwig Rathgeber, PharmD, [place], [facility] [phone]

## 2022-03-02 NOTE — Patient Instructions (Addendum)
It was nice to talk to you today!  Your goal blood sugar is 80-130 before eating and less than 180 after eating.  Medication Changes: Begin Farxiga 10 mg daily. We have started an application for patient assistance. Until then, your copay should be $47/month.   We have also transferred your prescriptions to be delivered from [facility]. Their phone number is [phone].   Monitor blood sugars at home and keep a log (glucometer or piece of paper) to bring with you to your next visit.  Keep up the good work with diet and exercise. Aim for a diet full of vegetables, fruit and lean meats (chicken, [place], fish). Try to limit salt intake by eating fresh or frozen vegetables (instead of canned), rinse canned vegetables prior to cooking and do not add any additional salt to meals.   Monitor blood pressure at home daily and keep a log (on your phone or piece of paper) to bring with you to your next visit. Write down date, time, blood pressure and pulse.  Keep up the good work with diet and exercise. Aim for a diet full of vegetables, fruit and lean meats (chicken, [place], fish). Try to limit salt intake by eating fresh or frozen vegetables (instead of canned), rinse canned vegetables prior to cooking and do not add any additional salt to meals.

## 2022-03-03 ENCOUNTER — Other Ambulatory Visit (HOSPITAL_COMMUNITY): Payer: Self-pay

## 2022-03-03 ENCOUNTER — Other Ambulatory Visit: Payer: Self-pay

## 2022-03-03 MED ORDER — PANTOPRAZOLE SODIUM 40 MG PO TBEC
40.0000 mg | DELAYED_RELEASE_TABLET | Freq: Every day | ORAL | 1 refills | Status: DC
  Filled 2022-03-03: qty 90, 90d supply, fill #0

## 2022-03-03 NOTE — Progress Notes (Signed)
sent 

## 2022-03-07 ENCOUNTER — Other Ambulatory Visit: Payer: Self-pay | Admitting: Internal Medicine

## 2022-03-08 ENCOUNTER — Other Ambulatory Visit (INDEPENDENT_AMBULATORY_CARE_PROVIDER_SITE_OTHER): Payer: PPO

## 2022-03-08 DIAGNOSIS — E1165 Type 2 diabetes mellitus with hyperglycemia: Secondary | ICD-10-CM

## 2022-03-08 DIAGNOSIS — E7800 Pure hypercholesterolemia, unspecified: Secondary | ICD-10-CM | POA: Diagnosis not present

## 2022-03-08 DIAGNOSIS — D72829 Elevated white blood cell count, unspecified: Secondary | ICD-10-CM

## 2022-03-08 LAB — CBC WITH DIFFERENTIAL/PLATELET
Basophils Absolute: 0.1 10*3/uL (ref 0.0–0.1)
Basophils Relative: 0.9 % (ref 0.0–3.0)
Eosinophils Absolute: 0.2 10*3/uL (ref 0.0–0.7)
Eosinophils Relative: 2.4 % (ref 0.0–5.0)
HCT: 38.7 % (ref 36.0–46.0)
Hemoglobin: 12.7 g/dL (ref 12.0–15.0)
Lymphocytes Relative: 21.4 % (ref 12.0–46.0)
Lymphs Abs: 2.1 10*3/uL (ref 0.7–4.0)
MCHC: 32.9 g/dL (ref 30.0–36.0)
MCV: 84.8 fl (ref 78.0–100.0)
Monocytes Absolute: 0.7 10*3/uL (ref 0.1–1.0)
Monocytes Relative: 6.7 % (ref 3.0–12.0)
Neutro Abs: 6.8 10*3/uL (ref 1.4–7.7)
Neutrophils Relative %: 68.6 % (ref 43.0–77.0)
Platelets: 282 10*3/uL (ref 150.0–400.0)
RBC: 4.56 Mil/uL (ref 3.87–5.11)
RDW: 17 % — ABNORMAL HIGH (ref 11.5–15.5)
WBC: 10 10*3/uL (ref 4.0–10.5)

## 2022-03-08 LAB — HEPATIC FUNCTION PANEL
ALT: 17 U/L (ref 0–35)
AST: 14 U/L (ref 0–37)
Albumin: 4.4 g/dL (ref 3.5–5.2)
Alkaline Phosphatase: 67 U/L (ref 39–117)
Bilirubin, Direct: 0.1 mg/dL (ref 0.0–0.3)
Total Bilirubin: 0.5 mg/dL (ref 0.2–1.2)
Total Protein: 7 g/dL (ref 6.0–8.3)

## 2022-03-08 LAB — BASIC METABOLIC PANEL
BUN: 19 mg/dL (ref 6–23)
CO2: 29 mEq/L (ref 19–32)
Calcium: 9.5 mg/dL (ref 8.4–10.5)
Chloride: 101 mEq/L (ref 96–112)
Creatinine, Ser: 1.04 mg/dL (ref 0.40–1.20)
GFR: 49.69 mL/min — ABNORMAL LOW (ref >60.00)
Glucose, Bld: 141 mg/dL — ABNORMAL HIGH (ref 70–99)
Potassium: 4.9 mEq/L (ref 3.5–5.1)
Sodium: 138 mEq/L (ref 135–145)

## 2022-03-08 LAB — LIPID PANEL
Cholesterol: 207 mg/dL — ABNORMAL HIGH (ref 0–200)
HDL: 36.9 mg/dL — ABNORMAL LOW (ref >39.00)
LDL Cholesterol: 133 mg/dL — ABNORMAL HIGH (ref 0–99)
NonHDL: 170.46
Total CHOL/HDL Ratio: 6
Triglycerides: 186 mg/dL — ABNORMAL HIGH (ref 0.0–149.0)
VLDL: 37.2 mg/dL (ref 0.0–40.0)

## 2022-03-08 LAB — HEMOGLOBIN A1C: Hgb A1c MFr Bld: 7.7 % — ABNORMAL HIGH (ref 4.6–6.5)

## 2022-03-10 ENCOUNTER — Other Ambulatory Visit (HOSPITAL_COMMUNITY): Payer: Self-pay

## 2022-03-11 ENCOUNTER — Ambulatory Visit (INDEPENDENT_AMBULATORY_CARE_PROVIDER_SITE_OTHER): Payer: PPO | Admitting: Internal Medicine

## 2022-03-11 VITALS — BP 130/70 | HR 90 | Temp 98.2°F | Resp 16 | Ht 63.0 in | Wt 177.0 lb

## 2022-03-11 DIAGNOSIS — E1165 Type 2 diabetes mellitus with hyperglycemia: Secondary | ICD-10-CM | POA: Diagnosis not present

## 2022-03-11 DIAGNOSIS — I10 Essential (primary) hypertension: Secondary | ICD-10-CM | POA: Diagnosis not present

## 2022-03-11 DIAGNOSIS — E7800 Pure hypercholesterolemia, unspecified: Secondary | ICD-10-CM

## 2022-03-11 DIAGNOSIS — D649 Anemia, unspecified: Secondary | ICD-10-CM | POA: Diagnosis not present

## 2022-03-11 DIAGNOSIS — N1831 Chronic kidney disease, stage 3a: Secondary | ICD-10-CM | POA: Diagnosis not present

## 2022-03-11 DIAGNOSIS — D72829 Elevated white blood cell count, unspecified: Secondary | ICD-10-CM

## 2022-03-11 NOTE — Progress Notes (Signed)
Subjective:    Patient ID: Cathy Vazquez, female    DOB: 1940/11/16, 82 y.o.   MRN: [id number]  Patient here for  Chief Complaint  Patient presents with   Medical Management of Chronic Issues    HPI Here to follow up regarding her diabetes, hypertension and hypercholesterolemia. Benicar increased to '40mg'$  q day last visit.  Seeing [facility] - recommended starting farxiga. Has not started yet.  Waiting to hear about coverage.  A1c just checked 7.7.  reviewed sugars - target range 52% (time active 32%).  High 47%.  Discussed diet and exercise.  Discussed farxiga.  Overall feeling better.  No chest pain or sob reported.  No abdominal pain.   Past Medical History:  Diagnosis Date   Anemia    Diverticulosis    GERD (gastroesophageal reflux disease)    Glaucoma    Hypercholesterolemia    Hypertension    Thyroid nodule    Past Surgical History:  Procedure Laterality Date   ABDOMINAL HYSTERECTOMY  1987   fibroids, ovaries not removed   TONSILLECTOMY     Family History  Problem Relation Age of Onset   Hypertension Mother    Hypercholesterolemia Mother    Rheumatic fever Mother    Diabetes Father    Hypertension Father    Hypercholesterolemia Father    Heart disease Father        CABG   Diabetes Sister    Hypertension Brother    Breast cancer Maternal Aunt    Breast cancer Cousin    Colon cancer Neg Hx    Social History   Socioeconomic History   Marital status: Divorced    Spouse name: Not on file   Number of children: 2   Years of education: Not on file   Highest education level: Not on file  Occupational History   Not on file  Tobacco Use   Smoking status: Never   Smokeless tobacco: Never  Vaping Use   Vaping Use: Never used  Substance and Sexual Activity   Alcohol use: No    Alcohol/week: 0.0 standard drinks of alcohol   Drug use: No   Sexual activity: Not Currently  Other Topics Concern   Not on file  Social History Narrative   Not on file   Social  Determinants of Health   Financial Resource Strain: Low Risk  (05/18/2021)   Overall Financial Resource Strain (CARDIA)    Difficulty of Paying Living Expenses: Not hard at all  Food Insecurity: No Food Insecurity (05/24/2021)   Hunger Vital Sign    Worried About Running Out of Food in the Last Year: Never true    Ran Out of Food in the Last Year: Never true  Transportation Needs: No Transportation Needs (05/18/2021)   PRAPARE - Hydrologist (Medical): No    Lack of Transportation (Non-Medical): No  Physical Activity: Unknown (05/11/2020)   Exercise Vital Sign    Days of Exercise per Week: 0 days    Minutes of Exercise per Session: Not on file  Stress: No Stress Concern Present (05/11/2020)   [place]    Feeling of Stress : Not at all  Social Connections: Unknown (05/18/2021)   Social Connection and Isolation Panel [NHANES]    Frequency of Communication with Friends and Family: More than three times a week    Frequency of Social Gatherings with Friends and Family: More than three times a week  Attends Religious Services: Not on file    Active Member of Clubs or Organizations: Not on file    Attends Archivist Meetings: Not on file    Marital Status: Not on file     Review of Systems  Constitutional:  Negative for appetite change and unexpected weight change.  HENT:  Negative for congestion and sinus pressure.   Respiratory:  Negative for cough, chest tightness and shortness of breath.   Cardiovascular:  Negative for chest pain, palpitations and leg swelling.  Gastrointestinal:  Negative for abdominal pain, diarrhea, nausea and vomiting.  Genitourinary:  Negative for difficulty urinating and dysuria.  Musculoskeletal:  Negative for joint swelling and myalgias.  Skin:  Negative for color change and rash.  Neurological:  Negative for dizziness and headaches.   Psychiatric/Behavioral:  Negative for agitation and dysphoric mood.        Objective:     BP 130/70   Pulse 90   Temp 98.2 F (36.8 C)   Resp 16   Wt 177 lb (80.3 kg)   LMP 02/21/1984   SpO2 97%   BMI 31.35 kg/m  Wt Readings from Last 3 Encounters:  03/11/22 177 lb (80.3 kg)  01/18/22 176 lb 4 oz (79.9 kg)  11/05/21 177 lb 6.4 oz (80.5 kg)    Physical Exam Vitals reviewed.  Constitutional:      General: She is not in acute distress.    Appearance: Normal appearance.  HENT:     Head: Normocephalic and atraumatic.     Right Ear: External ear normal.     Left Ear: External ear normal.  Eyes:     General: No scleral icterus.       Right eye: No discharge.        Left eye: No discharge.     Conjunctiva/sclera: Conjunctivae normal.  Neck:     Thyroid: No thyromegaly.  Cardiovascular:     Rate and Rhythm: Normal rate and regular rhythm.  Pulmonary:     Effort: No respiratory distress.     Breath sounds: Normal breath sounds. No wheezing.  Abdominal:     General: Bowel sounds are normal.     Palpations: Abdomen is soft.     Tenderness: There is no abdominal tenderness.  Musculoskeletal:        General: No swelling or tenderness.     Cervical back: Neck supple. No tenderness.  Lymphadenopathy:     Cervical: No cervical adenopathy.  Skin:    Findings: No erythema or rash.  Neurological:     Mental Status: She is alert.  Psychiatric:        Mood and Affect: Mood normal.        Behavior: Behavior normal.      Outpatient Encounter Medications as of 03/11/2022  Medication Sig   acetaminophen (TYLENOL) 325 MG tablet Take 650 mg by mouth every 4 (four) hours as needed.   Alpha-Lipoic Acid 200 MG CAPS Take 200 mg by mouth daily.   Bioflavonoid Products (BIOFLEX PO) Take 1 tablet by mouth daily.    Continuous Blood Gluc Receiver (FREESTYLE LIBRE 2 READER) DEVI 1 application. by Does not apply route every 14 (fourteen) days.   Continuous Blood Gluc Sensor  (FREESTYLE LIBRE 2 SENSOR) MISC Use one sensor every 14 (fourteen) days as directed   dapagliflozin propanediol (FARXIGA) 5 MG TABS tablet Take 1 tablet (5 mg total) by mouth daily before breakfast.   fish oil-omega-3 fatty acids 1000 MG capsule Take 1  capsule (1 g total) by mouth daily.   insulin degludec (TRESIBA FLEXTOUCH) 100 UNIT/ML FlexTouch Pen Inject 10 Units into the skin daily.   Insulin Pen Needle (PEN NEEDLES) 32G X 4 MM MISC Used to give daily insulin injections.   Multiple Vitamin (MULTIVITAMIN) tablet Take 1 tablet by mouth daily.   olmesartan (BENICAR) 40 MG tablet Take 1 tablet (40 mg total) by mouth daily.   pantoprazole (PROTONIX) 40 MG tablet TAKE 1 TABLET BY MOUTH EVERY DAY   Facility-Administered Encounter Medications as of 03/11/2022  Medication   cyanocobalamin ((VITAMIN B-12)) injection 1,000 mcg     Lab Results  Component Value Date   WBC 10.0 03/08/2022   HGB 12.7 03/08/2022   HCT 38.7 03/08/2022   PLT 282.0 03/08/2022   GLUCOSE 141 (H) 03/08/2022   CHOL 207 (H) 03/08/2022   TRIG 186.0 (H) 03/08/2022   HDL 36.90 (L) 03/08/2022   LDLDIRECT 130.0 07/15/2021   LDLCALC 133 (H) 03/08/2022   ALT 17 03/08/2022   AST 14 03/08/2022   NA 138 03/08/2022   K 4.9 03/08/2022   CL 101 03/08/2022   CREATININE 1.04 03/08/2022   BUN 19 03/08/2022   CO2 29 03/08/2022   TSH 3.34 07/15/2021   HGBA1C 7.7 (H) 03/08/2022   MICROALBUR 3.3 (H) 07/29/2021       Assessment & Plan:  Primary hypertension Assessment & Plan: Benicar increased to '40mg'$  q day last visit.  States blood pressures at home averaging 130/70s.  Continue current medication.  Follow pressures.  Follow metabolic panel.    Anemia, unspecified type Assessment & Plan: Follow cbc.    Stage 3a chronic kidney disease (HCC) Assessment & Plan: Avoid antiinflammatories.  Follow metabolic panel.  Continue benicar. Increase dose as outlined.  Discussed starting farxiga.  Waiting on coverage information.      Hypercholesteremia Assessment & Plan: [facility] Lipid diet.  Have discussed calculated cholesterol risk and need for cholesterol medication.  Declines.  Follow lipid panel.   Orders: -     TSH; Future -     Lipid panel; Future -     Hepatic function panel; Future  Type 2 diabetes mellitus with hyperglycemia, without long-term current use of insulin (HCC) Assessment & Plan: a1c - 7.7 on recent check.  Improved.   Sugars have improved. Discussed low carb diet and exercise.  Off metformin. Intolerance. Discussed adding farxiga and titrating tresiba.  Waiting regarding coverage for farxiga.   Orders: -     Hemoglobin A1c; Future -     Basic metabolic panel; Future  Leukocytosis, unspecified type Assessment & Plan: White count just checked wnl.  Follow.       Einar Pheasant, MD

## 2022-03-13 ENCOUNTER — Encounter: Payer: Self-pay | Admitting: Internal Medicine

## 2022-03-13 NOTE — Assessment & Plan Note (Signed)
Follow cbc.  

## 2022-03-13 NOTE — Assessment & Plan Note (Signed)
Avoid antiinflammatories.  Follow metabolic panel.  Continue benicar. Increase dose as outlined.  Discussed starting farxiga.  Waiting on coverage information.

## 2022-03-13 NOTE — Assessment & Plan Note (Signed)
Benicar increased to '40mg'$  q day last visit.  States blood pressures at home averaging 130/70s.  Continue current medication.  Follow pressures.  Follow metabolic panel.

## 2022-03-13 NOTE — Assessment & Plan Note (Signed)
[place] Lipid diet.  Have discussed calculated cholesterol risk and need for cholesterol medication.  Declines.  Follow lipid panel.

## 2022-03-13 NOTE — Assessment & Plan Note (Signed)
White count just checked wnl.  Follow.

## 2022-03-13 NOTE — Assessment & Plan Note (Signed)
Has seen endocrinology previously.  Follow tsh. Needs f/u thyroid ultrasound.

## 2022-03-13 NOTE — Assessment & Plan Note (Signed)
a1c - 7.7 on recent check.  Improved.   Sugars have improved. Discussed low carb diet and exercise.  Off metformin. Intolerance. Discussed adding farxiga and titrating tresiba.  Waiting regarding coverage for farxiga.

## 2022-03-15 ENCOUNTER — Other Ambulatory Visit (HOSPITAL_COMMUNITY): Payer: Self-pay

## 2022-03-19 ENCOUNTER — Other Ambulatory Visit: Payer: Self-pay | Admitting: Internal Medicine

## 2022-03-29 ENCOUNTER — Other Ambulatory Visit: Payer: PPO | Admitting: Pharmacist

## 2022-03-29 NOTE — Patient Instructions (Signed)
Cathy Vazquez,   It was great talking to you today!  We'll go ahead and start the process of applying for patient assistance for Farxiga. I'll call you in 2 weeks to follow up.   Thanks!  [name], PharmD, [place], [facility] [phone]

## 2022-03-29 NOTE — Progress Notes (Signed)
03/29/2022 Name: Cathy Vazquez MRN: [id number] DOB: [birth year]  Chief Complaint  Patient presents with   Medication Management   Diabetes    Cathy Vazquez is a 82 y.o. year old female who presented for a telephone visit.   They were referred to the pharmacist by their PCP for assistance in managing diabetes.   Subjective:  Care Team: Primary Care Provider: Einar Pheasant, MD ; Next Scheduled Visit: 06/15/22  Medication Access/Adherence  Current Pharmacy:  [place] [address] Phone: [phone] Fax: [phone]  [id number] [place] [address] [address] [place]Phone: [phone]: [phone]  Patient reports affordability concerns with their medications: No  Patient reports access/transportation concerns to their pharmacy: No  Patient reports adherence concerns with their medications:  No     Diabetes:  Current medications: Tresiba 10 units daily; reports she decided against starting Farxiga due to the risk of genital yeast infections  Current glucose readings: reports readings 130-250s   Patient reports hypoglycemic s/sx including dizziness, shakiness, sweating when glucose ~ 120s  Discussed FIran    Objective:  Lab Results  Component Value Date   HGBA1C 7.7 (H) 03/08/2022    Lab Results  Component Value Date   CREATININE 1.04 03/08/2022   BUN 19 03/08/2022   NA 138 03/08/2022   K 4.9 03/08/2022   CL 101 03/08/2022   CO2 29 03/08/2022    Lab Results  Component Value Date   CHOL 207 (H) 03/08/2022   HDL 36.90 (L) 03/08/2022   LDLCALC 133 (H) 03/08/2022   LDLDIRECT 130.0 07/15/2021   TRIG 186.0 (H) 03/08/2022   CHOLHDL 6 03/08/2022    Medications Reviewed Today     Reviewed by HOsker Mason RPH-CPP (Pharmacist) on 03/29/22 at 1[facility] Status: <None>   Medication Order Taking? Sig Documenting Provider Last Dose  Status Informant  acetaminophen (TYLENOL) 325 MG tablet [id number] Take 650 mg by mouth every 4 (four) hours as needed. [provider]  Active Self  Alpha-Lipoic Acid 200 MG CAPS [id number] Take 200 mg by mouth daily. [provider]  Active Self  Bioflavonoid Products (BIOFLEX PO) [id number] Take 1 tablet by mouth daily.  [provider]  Active Self  Continuous Blood Gluc Receiver (FREESTYLE LIBRE 2 READER) DEVI [id number] 1 application. by Does not apply route every 14 (fourteen) days. SEinar Pheasant MD  Active   Continuous Blood Gluc Sensor (FREESTYLE LIBRE 2 SENSOR) [id number] Use one sensor every 14 (fourteen) days as directed SEinar Pheasant MD  Active   cyanocobalamin ((VITAMIN B-12)) injection 1,000 mcg [id number]  SEinar Pheasant MD  Active   dapagliflozin propanediol (FARXIGA) 5 MG TABS tablet [id number] Take 1 tablet (5 mg total) by mouth daily before breakfast.  Patient not taking: Reported on 03/29/2022   SEinar Pheasant MD Not Taking Active   fish oil-omega-3 fatty acids 1000 MG capsule [id number] Take 1 capsule (1 g total) by mouth daily. SEinar Pheasant MD  Active   insulin degludec ([facility] 100 UNIT/ML FlexTouch Pen [id number] Inject 10 Units into the skin daily. SEinar Pheasant MD Taking Active   Insulin Pen Needle (PEN NEEDLES) 32G X 4 MM MISC [id number] Used to give daily insulin injections. SEinar Pheasant MD  Active   Multiple Vitamin (MULTIVITAMIN) tablet [id number] Take 1 tablet by mouth  daily. [provider]  Active Self  olmesartan (BENICAR) 40 MG tablet [id number]  Take 1 tablet (40 mg total) by mouth daily. Einar Pheasant, MD  Active   pantoprazole (PROTONIX) 40 MG tablet [id number]  TAKE 1 TABLET BY MOUTH EVERY DAY Einar Pheasant, MD  Active               Assessment/Plan:   Diabetes: - Currently controlled at more relaxed goal of A1c <8% - Discussed that risk of yeast infections comes from  glucosuria, and given relatively well controlled A1c, patient would have a lower risk of that. Also discussed renal protective benefit. Patient elects to think about adding [place] Glade, but would like to go ahead and start the process of applying for patient assistance. Will collaborate with pharmacy technician team to help with that.  - For now, continue Tresiba 10 units daily   Follow Up Plan: phone call follow up in 2 weeks  Catie TJodi Mourning, PharmD, [place], [facility] [phone]

## 2022-03-30 ENCOUNTER — Other Ambulatory Visit (HOSPITAL_COMMUNITY): Payer: Self-pay

## 2022-03-30 NOTE — Progress Notes (Addendum)
I have received pt pgs and provider pgs and Submitted application for FARXIGA to [employer] for patient assistance.   Phone: [phone]    I have received pt portion and I have faxed provider portion to [facility] [place] [phone] ATTENTION  Ulanda Edison, CMA 04/20/2022.    Mailed PAP application  FOR FARXIGA to patient home.I  will fax provider portion when I I receive pt portion.   Thanks you! Georga [facility] Patient Advocate

## 2022-04-06 ENCOUNTER — Telehealth: Payer: Self-pay

## 2022-04-06 NOTE — Telephone Encounter (Signed)
Cathy Vazquez patient assistance meds received. Pen needles also received. Medication has been labeled and ready for pick up. Pt is aware.

## 2022-04-13 ENCOUNTER — Other Ambulatory Visit: Payer: PPO

## 2022-04-13 ENCOUNTER — Other Ambulatory Visit: Payer: PPO | Admitting: Pharmacist

## 2022-04-13 NOTE — Progress Notes (Signed)
04/13/2022 Name: Cathy Vazquez MRN: [id number] DOB: 09/27/1940  Chief Complaint  Patient presents with   Medication Management    Diabetes / Wilder Glade PAP    Cathy Vazquez is a 82 y.o. year old female who presented for a telephone visit.   They were referred to the pharmacist by their PCP for assistance in managing diabetes and hypertension.   Subjective:  Care Team: Primary Care Provider: Einar Pheasant, MD ; Next Scheduled Visit: 06/16/2022  Medication Access/Adherence  Current Pharmacy:  [place] [address] Phone: [phone] Fax: [phone]  [id number] [place] [address] [place]: [phone]: [phone]  Patient reports affordability concerns with their medications: Yes , total monthly income is $1,225 Patient reports access/transportation concerns to their pharmacy: Yes , does not have car. Rarely drives after hip surgery.  Patient reports adherence concerns with their medications:  No     Diabetes:  Current medications: Tresiba 10 units daily  Patient has FreeStyle Libre 2 but does not have compatible smart phone for [employer]  CGM readings: 100s-200s, usually spikes in the 200s PP after she has a high carb meal 14 day average: 201 No hypoglycemia events  Patient denies hypoglycemic s/sx including dizziness, shakiness, sweating. Patient denies hyperglycemic symptoms including polyuria, polydipsia, polyphagia, nocturia, neuropathy, blurred vision.  Current medication access support: TTyler Aas& Farxiga PAP -[facility]   Hypertension:  Current medications: olmesartan 40 mg daily   Patient has a validated, automated, upper arm home BP cuff Current blood pressure readings readings: 120-140s/70-80s  Patient denies hypotensive s/sx including dizziness,  lightheadedness.  Patient denies hypertensive symptoms including headache, chest pain, shortness of breath  Current physical activity: limited by hip pain and age   Health Maintenance  Health Maintenance Due  Topic Date Due   COVID-19 Vaccine (1) Never done   Pneumonia Vaccine 84 Years old (2 of 2 - PPSV23 or PCV20) 10/29/2013   Medicare Annual Wellness (AWV)  05/19/2022     Objective: Lab Results  Component Value Date   HGBA1C 7.7 (H) 03/08/2022    Lab Results  Component Value Date   CREATININE 1.04 03/08/2022   BUN 19 03/08/2022   NA 138 03/08/2022   K 4.9 03/08/2022   CL 101 03/08/2022   CO2 29 03/08/2022    Lab Results  Component Value Date   CHOL 207 (H) 03/08/2022   HDL 36.90 (L) 03/08/2022   LDLCALC 133 (H) 03/08/2022   LDLDIRECT 130.0 07/15/2021   TRIG 186.0 (H) 03/08/2022   CHOLHDL 6 03/08/2022    Medications Reviewed Today     Reviewed by SPauletta Browns RHighland(Pharmacist) on 04/13/22 at 1[place] [facility] List Status: <None>   Medication Order Taking? Sig Documenting Provider Last Dose Status Informant  acetaminophen (TYLENOL) 325 MG tablet [id number] Take 650 mg by mouth every 4 (four) hours as needed. [provider] Taking Active Self  Alpha-Lipoic Acid 200 MG CAPS [id number] Take 200 mg by mouth daily. [provider] Taking Active Self  Bioflavonoid Products (BIOFLEX PO) [id number] Take 1 tablet by mouth daily.  [provider] Taking Active Self  Continuous Blood Gluc Receiver (FREESTYLE LIBRE 2 READER) DEVI 30000000No 1 application. by Does not apply route every 14 (fourteen) days. SEinar Pheasant MD Taking Active   Continuous  Blood Gluc Sensor (FREESTYLE LIBRE 2 SENSOR) MISC [id number]  Use one sensor every 14 (fourteen) days as directed Einar Pheasant, MD  Active   cyanocobalamin ((VITAMIN B-12)) injection 1,000 mcg [id number]   Einar Pheasant, MD  Active   dapagliflozin propanediol (FARXIGA) 5 MG TABS tablet  [id number] No Take 1 tablet (5 mg total) by mouth daily before breakfast.  Patient not taking: Reported on 03/29/2022   Einar Pheasant, MD Not Taking Active   fish oil-omega-3 fatty acids 1000 MG capsule [id number] No Take 1 capsule (1 g total) by mouth daily. Einar Pheasant, MD Taking Active   insulin degludec (TRESIBA FLEXTOUCH) 100 UNIT/ML FlexTouch Pen [id number] No Inject 10 Units into the skin daily. Einar Pheasant, MD Taking Active   Insulin Pen Needle (PEN NEEDLES) 32G X 4 MM MISC [id number] No Used to give daily insulin injections. Einar Pheasant, MD Taking Active   Multiple Vitamin (MULTIVITAMIN) tablet [id number] No Take 1 tablet by mouth daily. [provider] Taking Active Self  olmesartan (BENICAR) 40 MG tablet [id number] No Take 1 tablet (40 mg total) by mouth daily. Einar Pheasant, MD Taking Active   pantoprazole (PROTONIX) 40 MG tablet [id number]  TAKE 1 TABLET BY MOUTH EVERY DAY Einar Pheasant, MD  Active              Assessment/Plan:   Diabetes: - Currently with improved control. A1c down to 7.7%, from 8.7% in 10/2021 - Reviewed long term cardiovascular and renal outcomes of uncontrolled blood sugar - Reviewed goal A1c, goal fasting, and goal 2 hour post prandial glucose - Recommend to continue [place] 10 units daily. Patient does not plan to start [place] until PAP approved.  - Patient mailed Farxiga PAP application last week. Explained to patient it will take 4-6 weeks until coverage is determined once the application is complete.  - Recommend to check glucose continuously with CGM. - Meets financial criteria for [place] patient assistance program through [place]. Will collaborate with provider, CPhT, and patient to pursue assistance.    Hypertension: - Currently mostly controlled per patient report. Home readings range 120-140s/70-80s.  - Reviewed long term cardiovascular and renal outcomes of uncontrolled blood pressure - Reviewed appropriate blood pressure  monitoring technique and reviewed goal blood pressure. Recommended to check home blood pressure and heart rate daily - Recommend to continue current treatment. Expect some BP improvement with the addition of [place].  Follow Up Plan:  Pharmacist: 4-6 weeks  PCP visit: April 2024  Joseph Art, Sherian Rein.D. PGY-2 Ambulatory Care Pharmacy Resident

## 2022-04-13 NOTE — Patient Instructions (Signed)
It was nice talking to you today!  Once we receive your Farxiga patient assistance application, it will take 4-6 weeks for the company to determine if you are able to [place] for free.   Continue Tresiba 10 units daily.   Your goal blood sugar is 80-130 before eating and less than 180 after eating.  Keep up the good work with diet and exercise. Aim for a diet full of vegetables, fruit and lean meats (chicken, [place], fish). Try to limit salt intake by eating fresh or frozen vegetables (instead of canned), rinse canned vegetables prior to cooking and do not add any additional salt to meals.    Take care! Joseph Art, PharmD

## 2022-04-20 ENCOUNTER — Other Ambulatory Visit: Payer: Self-pay | Admitting: Internal Medicine

## 2022-04-20 DIAGNOSIS — E1165 Type 2 diabetes mellitus with hyperglycemia: Secondary | ICD-10-CM

## 2022-04-21 ENCOUNTER — Other Ambulatory Visit: Payer: Self-pay | Admitting: Internal Medicine

## 2022-04-22 NOTE — Telephone Encounter (Signed)
Pt picked up medication.

## 2022-05-14 ENCOUNTER — Other Ambulatory Visit: Payer: Self-pay | Admitting: Internal Medicine

## 2022-05-17 ENCOUNTER — Telehealth: Payer: Self-pay | Admitting: Internal Medicine

## 2022-05-17 NOTE — Telephone Encounter (Signed)
Contacted Cathy Vazquez to schedule their annual wellness visit. Appointment made for 05/23/2022.  Thank you,  [facility]  [phone]

## 2022-05-18 ENCOUNTER — Other Ambulatory Visit: Payer: PPO

## 2022-05-18 NOTE — Progress Notes (Signed)
I am going to forward this to my nurse to see if this is something that we have signed.  If not, we may need you to fax again.  Thanks.

## 2022-05-18 NOTE — Progress Notes (Signed)
05/18/2022 Name: Cathy Vazquez MRN: [id number] DOB: [birth year]  Chief Complaint  Patient presents with   Medication Management    DM, Wilder Glade PAP    Cathy Vazquez is a 82 y.o. year old female who presented for a telephone visit.   They were referred to the pharmacist by their PCP for assistance in managing diabetes and hypertension.   Subjective:  Care Team: Primary Care Provider: Einar Pheasant, MD ; Next Scheduled Visit: 06/16/2022  Medication Access/Adherence  Current Pharmacy:  [place] [address] Phone: [phone] Fax: [phone]  [id number] [place] [address] [place] [address] Phone: [phone] Fax: [phone]   Patient reports affordability concerns with their medications: Yes , total monthly income is $1,225 Patient reports access/transportation concerns to their pharmacy: Yes , does not have car. Rarely drives after hip surgery.  Patient reports adherence concerns with their medications:  No     Diabetes:  Current medications: Tresiba 10 units daily, Farxiga (not started)  Patient has [employer] 2 but does not have compatible smart phone for [employer]  Fasting: 140-150s 2h PP: 160-170, only 200s once and a while. No hypoglycemia events  Patient denies hypoglycemic s/sx including dizziness, shakiness, sweating. Patient denies hyperglycemic symptoms including polyuria, polydipsia, polyphagia, nocturia, neuropathy, blurred vision.  Current medication access support: Tresiba & Farxiga PAP  Hypertension:  Current medications: olmesartan 40 mg daily   Patient has a validated, automated, upper arm home BP cuff Current blood pressure readings readings: 120-140s/70-80s  Patient denies hypotensive s/sx including dizziness, lightheadedness.  Patient denies hypertensive symptoms including headache, chest pain, shortness of  breath  Current physical activity: limited by hip pain and age   Health Maintenance  Health Maintenance Due  Topic Date Due   COVID-19 Vaccine (1) Never done   Zoster Vaccines- Shingrix (1 of 2) Never done   Pneumonia Vaccine 40+ Years old (2 of 2 - PPSV23 or PCV20) 10/29/2013   FOOT EXAM  05/12/2022   Medicare Annual Wellness (AWV)  05/19/2022     Objective: Lab Results  Component Value Date   HGBA1C 7.7 (H) 03/08/2022    Lab Results  Component Value Date   CREATININE 1.04 03/08/2022   BUN 19 03/08/2022   NA 138 03/08/2022   K 4.9 03/08/2022   CL 101 03/08/2022   CO2 29 03/08/2022    Lab Results  Component Value Date   CHOL 207 (H) 03/08/2022   HDL 36.90 (L) 03/08/2022   LDLCALC 133 (H) 03/08/2022   LDLDIRECT 130.0 07/15/2021   TRIG 186.0 (H) 03/08/2022   CHOLHDL 6 03/08/2022    Medications Reviewed Today     Reviewed by Cathy Vazquez, Derma (Pharmacist) on 05/18/22 at 1613  Med List Status: <None>   Medication Order Taking? Sig Documenting Provider Last Dose Status Informant  acetaminophen (TYLENOL) 325 MG tablet [id number]  Take 650 mg by mouth every 4 (four) hours as needed. [provider]  Active Self  Alpha-Lipoic Acid 200 MG CAPS [id number]  Take 200 mg by mouth daily. [provider]  Active Self  Bioflavonoid Products (BIOFLEX PO) [id number]  Take 1 tablet by mouth daily.  [provider]  Active Self  Continuous Blood Gluc Receiver (FREESTYLE LIBRE 2 READER) DEVI 0000000  1 application. by Does not apply route every 14 (fourteen) days. Cathy Pheasant, MD  Active   Continuous Blood Gluc Sensor (  FREESTYLE LIBRE 2 SENSOR) MISC [id number]  APPLY 1 EVERY 14 (FOURTEEN) DAYS. Cathy Pheasant, MD  Active   cyanocobalamin ((VITAMIN B-12)) injection 1,000 mcg [id number]   Cathy Pheasant, MD  Active   dapagliflozin propanediol (FARXIGA) 5 MG TABS tablet [id number] No Take 1 tablet (5 mg total) by mouth daily before breakfast.  Patient  not taking: Reported on 04/13/2022   Cathy Pheasant, MD Not Taking Active            Med Note Cathy Vazquez, Cathy Vazquez Apr 13, 2022  3:52 PM) Not starting until PAP approved  fish oil-omega-3 fatty acids 1000 MG capsule [id number]  Take 1 capsule (1 g total) by mouth daily. Cathy Pheasant, MD  Active   insulin degludec [facility]) 100 UNIT/ML FlexTouch Pen [id number] Yes Inject 10 Units into the skin daily. Cathy Pheasant, MD Taking Active   Insulin Pen Needle (PEN NEEDLES) 32G X 4 MM MISC [id number]  Used to give daily insulin injections. Cathy Pheasant, MD  Active   Multiple Vitamin (MULTIVITAMIN) tablet [id number]  Take 1 tablet by mouth daily. [provider]  Active Self  olmesartan (BENICAR) 40 MG tablet [id number]  TAKE 1 TABLET BY MOUTH EVERY DAY Cathy Pheasant, MD  Active   pantoprazole (PROTONIX) 40 MG tablet [id number]  TAKE 1 TABLET BY MOUTH EVERY DAY Cathy Pheasant, MD  Active              Assessment/Plan:   Diabetes: - Currently with improved control. A1c down to 7.7%, from 8.7% in 10/2021 - Reviewed long term cardiovascular and renal outcomes of uncontrolled blood sugar - Reviewed goal A1c, goal fasting, and goal 2 hour post prandial glucose - Recommend to continue [place] 10 units daily.  - Patient [place] not heard an update from [place]. Called and spoke with Cathy Vazquez, they have not yet received or started processing her application. Will follow up with patient advocate to determine status of application.  - Recommend to check glucose continuously with CGM. - Meets financial criteria for [place] patient assistance program through [place]. Will collaborate with provider, CPhT, and patient to pursue assistance.    Hypertension: - Currently mostly controlled per patient report. Home readings range 120-140s/70-80s.  - Reviewed long term cardiovascular and renal outcomes of uncontrolled blood pressure - Reviewed appropriate blood pressure monitoring technique and  reviewed goal blood pressure. Recommended to check home blood pressure and heart rate daily - Recommend to continue current treatment. Expect some BP improvement with the addition of [place].  Follow Up Plan:  Pharmacist: 6 weeks PCP visit: April 2024  Cathy Vazquez, Cathy Vazquez.D. PGY-2 Ambulatory Care Pharmacy Resident

## 2022-05-23 ENCOUNTER — Ambulatory Visit (INDEPENDENT_AMBULATORY_CARE_PROVIDER_SITE_OTHER): Payer: PPO

## 2022-05-23 VITALS — Ht 63.0 in | Wt 177.0 lb

## 2022-05-23 DIAGNOSIS — Z0000 Encounter for general adult medical examination without abnormal findings: Secondary | ICD-10-CM

## 2022-05-23 NOTE — Patient Instructions (Addendum)
Cathy Vazquez , Thank you for taking time to come for your Medicare Wellness Visit. I appreciate your ongoing commitment to your health goals. Please review the following plan we discussed and let me know if I can assist you in the future.   These are the goals we discussed:  Goals      Follow up with Primary Care Provider     As needed Healthy diet Stay active        This is a list of the screening recommended for you and due dates:  Health Maintenance  Topic Date Due   Complete foot exam   05/12/2022   COVID-19 Vaccine (1) 06/08/2022*   Pneumonia Vaccine (2 of 2 - PPSV23 or PCV20) 07/23/2022*   Zoster (Shingles) Vaccine (1 of 2) 08/22/2022*   Mammogram  01/19/2023*   Eye exam for diabetics  01/19/2023*   Yearly kidney health urinalysis for diabetes  07/30/2022   Hemoglobin A1C  09/06/2022   Flu Shot  09/22/2022   Yearly kidney function blood test for diabetes  03/09/2023   Medicare Annual Wellness Visit  05/23/2023   DTaP/Tdap/Td vaccine (2 - Td or Tdap) 05/18/2028   DEXA scan (bone density measurement)  Completed   HPV Vaccine  Aged Out  *Topic was postponed. The date shown is not the original due date.    Advanced directives: on file.  Conditions/risks identified: none new.   Next appointment: Follow up in one year for your annual wellness visit    Preventive Care 65 Years and Older, Female Preventive care refers to lifestyle choices and visits with your health care provider that can promote health and wellness. What does preventive care include? A yearly physical exam. This is also called an annual well check. Dental exams once or twice a year. Routine eye exams. Ask your health care provider how often you should have your eyes checked. Personal lifestyle choices, including: Daily care of your teeth and gums. Regular physical activity. Eating a healthy diet. Avoiding tobacco and drug use. Limiting alcohol use. Practicing safe sex. Taking low-dose aspirin every  day. Taking vitamin and mineral supplements as recommended by your health care provider. What happens during an annual well check? The services and screenings done by your health care provider during your annual well check will depend on your age, overall health, lifestyle risk factors, and family history of disease. Counseling  Your health care provider may ask you questions about your: Alcohol use. Tobacco use. Drug use. Emotional well-being. Home and relationship well-being. Sexual activity. Eating habits. History of falls. Memory and ability to understand (cognition). Work and work Statistician. Reproductive health. Screening  You may have the following tests or measurements: Height, weight, and BMI. Blood pressure. Lipid and cholesterol levels. These may be checked every 5 years, or more frequently if you are over 53 years old. Skin check. Lung cancer screening. You may have this screening every year starting at age 72 if you have a 30-pack-year history of smoking and currently smoke or have quit within the past 15 years. Fecal occult blood test (FOBT) of the stool. You may have this test every year starting at age 50. Flexible sigmoidoscopy or colonoscopy. You may have a sigmoidoscopy every 5 years or a colonoscopy every 10 years starting at age 22. Hepatitis C blood test. Hepatitis B blood test. Sexually transmitted disease (STD) testing. Diabetes screening. This is done by checking your blood sugar (glucose) after you have not eaten for a while (fasting). You may have  this done every 1-3 years. Bone density scan. This is done to screen for osteoporosis. You may have this done starting at age 65. Mammogram. This may be done every 1-2 years. Talk to your health care provider about how often you should have regular mammograms. Talk with your health care provider about your test results, treatment options, and if necessary, the need for more tests. Vaccines  Your health care  provider may recommend certain vaccines, such as: Influenza vaccine. This is recommended every year. Tetanus, diphtheria, and acellular pertussis (Tdap, Td) vaccine. You may need a Td booster every 10 years. Zoster vaccine. You may need this after age 22. Pneumococcal 13-valent conjugate (PCV13) vaccine. One dose is recommended after age 22. Pneumococcal polysaccharide (PPSV23) vaccine. One dose is recommended after age 71. Talk to your health care provider about which screenings and vaccines you need and how often you need them. This information is not intended to replace advice given to you by your health care provider. Make sure you discuss any questions you have with your health care provider. Document Released: 03/06/2015 Document Revised: 10/28/2015 Document Reviewed: 12/09/2014 Elsevier Interactive Patient Education  2017 [place] Prevention in the Home Falls can cause injuries. They can happen to people of all ages. There are many things you can do to make your home safe and to help prevent falls. What can I do on the outside of my home? Regularly fix the edges of walkways and driveways and fix any cracks. Remove anything that might make you trip as you walk through a door, such as a raised step or threshold. Trim any bushes or trees on the path to your home. Use bright outdoor lighting. Clear any walking paths of anything that might make someone trip, such as rocks or tools. Regularly check to see if handrails are loose or broken. Make sure that both sides of any steps have handrails. Any raised decks and porches should have guardrails on the edges. Have any leaves, snow, or ice cleared regularly. Use sand or salt on walking paths during winter. Clean up any spills in your garage right away. This includes oil or grease spills. What can I do in the bathroom? Use night lights. Install grab bars by the toilet and in the tub and shower. Do not use towel bars as grab  bars. Use non-skid mats or decals in the tub or shower. If you need to sit down in the shower, use a plastic, non-slip stool. Keep the floor dry. Clean up any water that spills on the floor as soon as it happens. Remove soap buildup in the tub or shower regularly. Attach bath mats securely with double-sided non-slip rug tape. Do not have throw rugs and other things on the floor that can make you trip. What can I do in the bedroom? Use night lights. Make sure that you have a light by your bed that is easy to reach. Do not use any sheets or blankets that are too big for your bed. They should not hang down onto the floor. Have a firm chair that has side arms. You can use this for support while you get dressed. Do not have throw rugs and other things on the floor that can make you trip. What can I do in the kitchen? Clean up any spills right away. Avoid walking on wet floors. Keep items that you use a lot in easy-to-reach places. If you need to reach something above you, use a strong step stool  that has a grab bar. Keep electrical cords out of the way. Do not use floor polish or wax that makes floors slippery. If you must use wax, use non-skid floor wax. Do not have throw rugs and other things on the floor that can make you trip. What can I do with my stairs? Do not leave any items on the stairs. Make sure that there are handrails on both sides of the stairs and use them. Fix handrails that are broken or loose. Make sure that handrails are as long as the stairways. Check any carpeting to make sure that it is firmly attached to the stairs. Fix any carpet that is loose or worn. Avoid having throw rugs at the top or bottom of the stairs. If you do have throw rugs, attach them to the floor with carpet tape. Make sure that you have a light switch at the top of the stairs and the bottom of the stairs. If you do not have them, ask someone to add them for you. What else can I do to help prevent  falls? Wear shoes that: Do not have high heels. Have rubber bottoms. Are comfortable and fit you well. Are closed at the toe. Do not wear sandals. If you use a stepladder: Make sure that it is fully opened. Do not climb a closed stepladder. Make sure that both sides of the stepladder are locked into place. Ask someone to hold it for you, if possible. Clearly mark and make sure that you can see: Any grab bars or handrails. First and last steps. Where the edge of each step is. Use tools that help you move around (mobility aids) if they are needed. These include: Canes. Walkers. Scooters. Crutches. Turn on the lights when you go into a dark area. Replace any light bulbs as soon as they burn out. Set up your furniture so you have a clear path. Avoid moving your furniture around. If any of your floors are uneven, fix them. If there are any pets around you, be aware of where they are. Review your medicines with your doctor. Some medicines can make you feel dizzy. This can increase your chance of falling. Ask your doctor what other things that you can do to help prevent falls. This information is not intended to replace advice given to you by your health care provider. Make sure you discuss any questions you have with your health care provider. Document Released: 12/04/2008 Document Revised: 07/16/2015 Document Reviewed: 03/14/2014 Elsevier Interactive Patient Education  2017 [employer].

## 2022-05-23 NOTE — Progress Notes (Signed)
Subjective:   Cathy Vazquez is a 82 y.o. female who presents for Medicare Annual (Subsequent) preventive examination.  Review of Systems    No ROS.  Medicare Wellness Virtual Visit.  Visual/audio telehealth visit, UTA vital signs.   See social history for additional risk factors.   Cardiac Risk Factors include: advanced age (>99men, >43 women);diabetes mellitus     Objective:    Today's Vitals   05/23/22 1003  Weight: 177 lb (80.3 kg)  Height: 5\' 3"  (1.6 m)   Body mass index is 31.35 kg/m.     05/23/2022   10:08 AM 09/06/2021    1:33 PM 05/18/2021   12:47 PM 05/11/2020   12:37 PM 05/08/2019    1:13 PM 05/19/2018   10:17 AM 05/19/2018   10:16 AM  Advanced Directives  Does Patient Have a Medical Advance Directive? Yes Yes Yes Yes Yes  Yes  Type of Paramedic of [place];Living will [place];Living will [place];Living will [place];Living will [place];Living will  [place]  Does patient want to make changes to medical advance directive? No - Patient declined  No - Patient declined No - Patient declined No - Patient declined No - Patient declined   Copy of [place] in Chart? Yes - validated most recent copy scanned in chart (See row information)  Yes - validated most recent copy scanned in chart (See row information) Yes - validated most recent copy scanned in chart (See row information) Yes - validated most recent copy scanned in chart (See row information) Yes - validated most recent copy scanned in chart (See row information)     Current Medications (verified) Outpatient Encounter Medications as of 05/23/2022  Medication Sig   acetaminophen (TYLENOL) 325 MG tablet Take 650 mg by mouth every 4 (four) hours as needed.   Alpha-Lipoic Acid 200 MG CAPS Take 200 mg by mouth daily.   Bioflavonoid Products (BIOFLEX PO) Take 1 tablet by  mouth daily.    Continuous Blood Gluc Receiver (FREESTYLE LIBRE 2 READER) DEVI 1 application. by Does not apply route every 14 (fourteen) days.   Continuous Blood Gluc Sensor (FREESTYLE LIBRE 2 SENSOR) MISC APPLY 1 EVERY 14 (FOURTEEN) DAYS.   dapagliflozin propanediol (FARXIGA) 5 MG TABS tablet Take 1 tablet (5 mg total) by mouth daily before breakfast. (Patient not taking: Reported on 04/13/2022)   fish oil-omega-3 fatty acids 1000 MG capsule Take 1 capsule (1 g total) by mouth daily.   insulin degludec (TRESIBA FLEXTOUCH) 100 UNIT/ML FlexTouch Pen Inject 10 Units into the skin daily.   Insulin Pen Needle (PEN NEEDLES) 32G X 4 MM MISC Used to give daily insulin injections.   Multiple Vitamin (MULTIVITAMIN) tablet Take 1 tablet by mouth daily.   olmesartan (BENICAR) 40 MG tablet TAKE 1 TABLET BY MOUTH EVERY DAY   pantoprazole (PROTONIX) 40 MG tablet TAKE 1 TABLET BY MOUTH EVERY DAY   Facility-Administered Encounter Medications as of 05/23/2022  Medication   cyanocobalamin ((VITAMIN B-12)) injection 1,000 mcg    Allergies (verified) Norvasc [amlodipine besylate], Contrast media [iodinated contrast media], Penicillins, Codeine, Diclofenac, Felodipine, Lipitor [atorvastatin], Stay awake [caffeine], and Zocor [simvastatin]   History: Past Medical History:  Diagnosis Date   Anemia    Diverticulosis    GERD (gastroesophageal reflux disease)    Glaucoma    Hypercholesterolemia    Hypertension    Thyroid nodule    Past Surgical  History:  Procedure Laterality Date   ABDOMINAL HYSTERECTOMY  1987   fibroids, ovaries not removed   TONSILLECTOMY     Family History  Problem Relation Age of Onset   Hypertension Mother    Hypercholesterolemia Mother    Rheumatic fever Mother    Diabetes Father    Hypertension Father    Hypercholesterolemia Father    Heart disease Father        CABG   Diabetes Sister    Hypertension Brother    Breast cancer Maternal Aunt    Breast cancer Cousin    Colon  cancer Neg Hx    Social History   Socioeconomic History   Marital status: Divorced    Spouse name: Not on file   Number of children: 2   Years of education: Not on file   Highest education level: Not on file  Occupational History   Not on file  Tobacco Use   Smoking status: Never   Smokeless tobacco: Never  Vaping Use   Vaping Use: Never used  Substance and Sexual Activity   Alcohol use: No    Alcohol/week: 0.0 standard drinks of alcohol   Drug use: No   Sexual activity: Not Currently  Other Topics Concern   Not on file  Social History Narrative   Not on file   Social Determinants of Health   Financial Resource Strain: Low Risk  (05/23/2022)   Overall Financial Resource Strain (CARDIA)    Difficulty of Paying Living Expenses: Not hard at all  Food Insecurity: No Food Insecurity (05/23/2022)   Hunger Vital Sign    Worried About Running Out of Food in the Last Year: Never true    Ran Out of Food in the Last Year: Never true  Transportation Needs: No Transportation Needs (05/23/2022)   PRAPARE - Hydrologist (Medical): No    Lack of Transportation (Non-Medical): No  Physical Activity: Unknown (05/23/2022)   Exercise Vital Sign    Days of Exercise per Week: 0 days    Minutes of Exercise per Session: Not on file  Stress: No Stress Concern Present (05/23/2022)   [place]    Feeling of Stress : Not at all  Social Connections: Unknown (05/23/2022)   Social Connection and Isolation Panel [NHANES]    Frequency of Communication with Friends and Family: More than three times a week    Frequency of Social Gatherings with Friends and Family: More than three times a week    Attends Religious Services: Not on Advertising copywriter or Organizations: Not on file    Attends Archivist Meetings: Not on file    Marital Status: Not on file    Tobacco Counseling Counseling given:  Not Answered   Clinical Intake:  Pre-visit preparation completed: Yes        Diabetes: Yes (Followed by pcp)  How often do you need to have someone help you when you read instructions, pamphlets, or other written materials from your doctor or pharmacy?: 1 - Never  Nutrition Risk Assessment: Has the patient had any N/V/D within the last 2 months?  No  Does the patient have any non-healing wounds?  No  Has the patient had any unintentional weight loss or weight gain?  No   Diabetes: Is the patient diabetic?  Yes  If diabetic, was a CBG obtained today?  Yes , FBS 170 Did the patient  bring in their glucometer from home?  No  How often do you monitor your CBG's? Off and on all day.   Financial Strains and Diabetes Management: Are you having any financial strains with the device, your supplies or your medication? No .  Does the patient want to be seen by Chronic Care Management for management of their diabetes?  No  Would the patient like to be referred to a Nutritionist or for Diabetic Management?  No     Interpreter Needed?: No      Activities of Daily Living    05/23/2022   10:10 AM  In your present state of health, do you have any difficulty performing the following activities:  Hearing? 0  Vision? 0  Difficulty concentrating or making decisions? 0  Walking or climbing stairs? 1  Comment Cane in use. Paces self with activity  Dressing or bathing? 0  Doing errands, shopping? 1  Preparing Food and eating ? N  Using the Toilet? N  In the past six months, have you accidently leaked urine? N  Do you have problems with loss of bowel control? N  Managing your Medications? N  Managing your Finances? N  Housekeeping or managing your Housekeeping? N    Patient Care Team: Einar Pheasant, MD as PCP - General (Internal Medicine) Osker Mason, RPH-CPP (Pharmacist)  Indicate any recent Medical Services you may have received from other than [facility] providers in the past  year (date may be approximate).     Assessment:   This is a routine wellness examination for Terrilee.  I connected with  [name] on 05/23/22 by a audio enabled telemedicine application and verified that I am speaking with the correct person using two identifiers.  Patient Location: Home  Provider Location: Office/Clinic  I discussed the limitations of evaluation and management by telemedicine. The patient expressed understanding and agreed to proceed.   Hearing/Vision screen Hearing Screening - Comments:: Patient is able to hear conversational tones without difficulty. No issues reported. Vision Screening - Comments:: Followed by [facility] Wears corrective lenses Cataract extraction, bilateral    Dietary issues and exercise activities discussed: Current Exercise Habits: Home exercise routine, Intensity: Mild   Goals Addressed             This Visit's Progress    Follow up with Primary Care Provider       As needed Healthy diet Stay active       Depression Screen    05/23/2022   10:08 AM 01/18/2022   11:42 AM 01/18/2022   11:38 AM 11/05/2021    2:43 PM 09/17/2021    4:12 PM 06/03/2021   11:24 AM 05/18/2021   12:44 PM  PHQ 2/9 Scores  PHQ - 2 Score 0 0 0 0 0 1 0    Fall Risk    05/23/2022   10:09 AM 01/18/2022   11:42 AM 01/18/2022   11:39 AM 11/05/2021    2:43 PM 09/17/2021    4:11 PM  Fall Risk   Falls in the past year? 0 0 0 0 0  Number falls in past yr: 0 0 0 0 0  Injury with Fall? 0 0 0 0 0  Risk for fall due to : Impaired balance/gait No Fall Risks  No Fall Risks No Fall Risks  Risk for fall due to: Comment Cane in use when ambulating      Follow up Falls evaluation completed;Falls prevention discussed Falls evaluation completed  Falls evaluation  completed Falls evaluation completed    FALL RISK PREVENTION PERTAINING TO THE HOME: Home free of loose throw rugs in walkways, pet beds, electrical cords, etc? Yes  Adequate lighting in  your home to reduce risk of falls? Yes   ASSISTIVE DEVICES UTILIZED TO PREVENT FALLS: Life alert? No  Use of a cane, walker or w/c? Yes  Grab bars in the bathroom? Yes  Shower chair or bench in shower? Yes  Elevated toilet seat or a handicapped toilet? Yes   TIMED UP AND GO: Was the test performed? No .   Cognitive Function:    04/26/2017    3:46 PM 04/25/2016    3:36 PM 02/26/2015    1:36 PM  MMSE - Mini Mental State Exam  Orientation to time 5 5 5   Orientation to Place 5 5 5   Registration 3 3 3   Attention/ Calculation 5 5 5   Recall 3 3 3   Language- name 2 objects 2 2 2   Language- repeat 1 1 1   Language- follow 3 step command 3 3 3   Language- read & follow direction 1 1 1   Write a sentence 1 1 1   Copy design 1 1 1   Total score 30 30 30         05/23/2022   10:13 AM 05/11/2020   12:49 PM 05/08/2019    1:28 PM 04/30/2018    3:07 PM  6CIT Screen  What Year? 0 points 0 points 0 points 0 points  What month? 0 points 0 points 0 points 0 points  What time? 0 points 0 points 0 points 0 points  Count back from 20 0 points 0 points 0 points 0 points  Months in reverse 0 points 0 points 0 points 0 points  Repeat phrase 0 points  0 points 0 points  Total Score 0 points  0 points 0 points    Immunizations Immunization History  Administered Date(s) Administered   Influenza Inj Mdck Quad Pf 05/01/2018   Influenza Split 10/29/2012   Influenza, High Dose Seasonal PF 01/05/2017, 12/13/2018   Pneumococcal Conjugate-13 10/29/2012   Tdap 05/19/2018   Zoster, Live 02/21/2009   Pneumococcal vaccine status: Due, Education has been provided regarding the importance of this vaccine. Advised may receive this vaccine at local pharmacy or Health Dept. Aware to provide a copy of the vaccination record if obtained from local pharmacy or Health Dept. Verbalized acceptance and understanding.  Covid-19 vaccine status: Declined, Education has been provided regarding the importance of this vaccine but  patient still declined. Advised may receive this vaccine at local pharmacy or Health Dept.or vaccine clinic. Aware to provide a copy of the vaccination record if obtained from local pharmacy or Health Dept. Verbalized acceptance and understanding.  Shingrix Completed?: No.    Education has been provided regarding the importance of this vaccine. Patient has been advised to call insurance company to determine out of pocket expense if they have not yet received this vaccine. Advised may also receive vaccine at local pharmacy or Health Dept. Verbalized acceptance and understanding.  Screening Tests Health Maintenance  Topic Date Due   FOOT EXAM  05/12/2022   COVID-19 Vaccine (1) 06/08/2022 (Originally 08/16/1945)   Pneumonia Vaccine 33+ Years old (2 of 2 - PPSV23 or PCV20) 07/23/2022 (Originally 10/29/2013)   Zoster Vaccines- Shingrix (1 of 2) 08/22/2022 (Originally 08/15/1959)   MAMMOGRAM  01/19/2023 (Originally 05/09/2020)   OPHTHALMOLOGY EXAM  01/19/2023 (Originally 04/21/2020)   Diabetic kidney evaluation - Urine ACR  07/30/2022  HEMOGLOBIN A1C  09/06/2022   INFLUENZA VACCINE  09/22/2022   Diabetic kidney evaluation - eGFR measurement  03/09/2023   Medicare Annual Wellness (AWV)  05/23/2023   DTaP/Tdap/Td (2 - Td or Tdap) 05/18/2028   DEXA SCAN  Completed   HPV VACCINES  Aged Out    Health Maintenance  Health Maintenance Due  Topic Date Due   FOOT EXAM  05/12/2022   Lung Cancer Screening: (Low Dose CT Chest recommended if Age 72-80 years, 30 pack-year currently smoking OR have quit w/in 15years.) does not qualify.   Hepatitis C Screening: does not qualify.  Vision Screening: Recommended annual ophthalmology exams for early detection of glaucoma and other disorders of the eye.  Dental Screening: Recommended annual dental exams for proper oral hygiene.  Community Resource Referral / Chronic Care Management: CRR required this visit?  No   CCM required this visit?  No      Plan:      I have personally reviewed and noted the following in the patient's chart:   Medical and social history Use of alcohol, tobacco or illicit drugs  Current medications and supplements including opioid prescriptions. Patient is not currently taking opioid prescriptions. Functional ability and status Nutritional status Physical activity Advanced directives List of other physicians Hospitalizations, surgeries, and ER visits in previous 12 months Vitals Screenings to include cognitive, depression, and falls Referrals and appointments  In addition, I have reviewed and discussed with patient certain preventive protocols, quality metrics, and best practice recommendations. A written personalized care plan for preventive services as well as general preventive health recommendations were provided to patient.     Leta Jungling, LPN   [id number]

## 2022-06-02 ENCOUNTER — Telehealth: Payer: Self-pay

## 2022-06-02 NOTE — Telephone Encounter (Signed)
Received notification from [place] regarding approval for [facility]. Patient assistance approved from 06/02/2022 to 02/21/2023.  Phone: [phone]  Georga Bora Rx Patient Advocate [phone]) [phone]   LETTER OF APPROVAL SCANNED IN MEDIA OF CHART

## 2022-06-02 NOTE — Telephone Encounter (Signed)
Called  and spoke to pt to let her know she was approved for [facility].   Georga Bora Rx Patient Advocate [phone]     Please be advised

## 2022-06-08 NOTE — Telephone Encounter (Signed)
LETTER OF SHIPMENT HAS BEEN SCANNED IN MEDIA OF CHART FROM [place] FOR Laure Kidney [facility] Rx Patient Advocate [phone]

## 2022-06-13 ENCOUNTER — Other Ambulatory Visit (INDEPENDENT_AMBULATORY_CARE_PROVIDER_SITE_OTHER): Payer: PPO

## 2022-06-13 DIAGNOSIS — E1165 Type 2 diabetes mellitus with hyperglycemia: Secondary | ICD-10-CM | POA: Diagnosis not present

## 2022-06-13 DIAGNOSIS — E7800 Pure hypercholesterolemia, unspecified: Secondary | ICD-10-CM | POA: Diagnosis not present

## 2022-06-13 LAB — LIPID PANEL
Cholesterol: 197 mg/dL (ref 0–200)
HDL: 37.5 mg/dL — ABNORMAL LOW (ref >39.00)
LDL Cholesterol: 133 mg/dL — ABNORMAL HIGH (ref 0–99)
NonHDL: 159.31
Total CHOL/HDL Ratio: 5
Triglycerides: 132 mg/dL (ref 0.0–149.0)
VLDL: 26.4 mg/dL (ref 0.0–40.0)

## 2022-06-13 LAB — HEPATIC FUNCTION PANEL
ALT: 16 U/L (ref 0–35)
AST: 14 U/L (ref 0–37)
Albumin: 4.2 g/dL (ref 3.5–5.2)
Alkaline Phosphatase: 67 U/L (ref 39–117)
Bilirubin, Direct: 0.1 mg/dL (ref 0.0–0.3)
Total Bilirubin: 0.5 mg/dL (ref 0.2–1.2)
Total Protein: 6.9 g/dL (ref 6.0–8.3)

## 2022-06-13 LAB — BASIC METABOLIC PANEL
BUN: 17 mg/dL (ref 6–23)
CO2: 27 mEq/L (ref 19–32)
Calcium: 9.1 mg/dL (ref 8.4–10.5)
Chloride: 100 mEq/L (ref 96–112)
Creatinine, Ser: 0.92 mg/dL (ref 0.40–1.20)
GFR: 57.45 mL/min — ABNORMAL LOW (ref >60.00)
Glucose, Bld: 134 mg/dL — ABNORMAL HIGH (ref 70–99)
Potassium: 4.7 mEq/L (ref 3.5–5.1)
Sodium: 135 mEq/L (ref 135–145)

## 2022-06-13 LAB — TSH: TSH: 2.15 u[IU]/mL (ref 0.35–5.50)

## 2022-06-13 LAB — HEMOGLOBIN A1C: Hgb A1c MFr Bld: 7.6 % — ABNORMAL HIGH (ref 4.6–6.5)

## 2022-06-16 ENCOUNTER — Encounter: Payer: Self-pay | Admitting: Internal Medicine

## 2022-06-16 ENCOUNTER — Ambulatory Visit (INDEPENDENT_AMBULATORY_CARE_PROVIDER_SITE_OTHER): Payer: PPO | Admitting: Internal Medicine

## 2022-06-16 VITALS — BP 132/76 | HR 96 | Temp 98.0°F | Resp 16 | Ht 63.0 in | Wt 176.2 lb

## 2022-06-16 DIAGNOSIS — E1165 Type 2 diabetes mellitus with hyperglycemia: Secondary | ICD-10-CM

## 2022-06-16 DIAGNOSIS — E042 Nontoxic multinodular goiter: Secondary | ICD-10-CM

## 2022-06-16 DIAGNOSIS — I10 Essential (primary) hypertension: Secondary | ICD-10-CM | POA: Diagnosis not present

## 2022-06-16 DIAGNOSIS — Z1231 Encounter for screening mammogram for malignant neoplasm of breast: Secondary | ICD-10-CM | POA: Diagnosis not present

## 2022-06-16 DIAGNOSIS — Z0000 Encounter for general adult medical examination without abnormal findings: Secondary | ICD-10-CM | POA: Diagnosis not present

## 2022-06-16 DIAGNOSIS — D649 Anemia, unspecified: Secondary | ICD-10-CM | POA: Diagnosis not present

## 2022-06-16 DIAGNOSIS — N1831 Chronic kidney disease, stage 3a: Secondary | ICD-10-CM

## 2022-06-16 DIAGNOSIS — E7800 Pure hypercholesterolemia, unspecified: Secondary | ICD-10-CM | POA: Diagnosis not present

## 2022-06-16 LAB — HM DIABETES FOOT EXAM

## 2022-06-16 MED ORDER — PANTOPRAZOLE SODIUM 40 MG PO TBEC: 40.0000 mg | DELAYED_RELEASE_TABLET | Freq: Every day | ORAL | 1 refills | Status: DC

## 2022-06-16 NOTE — Assessment & Plan Note (Addendum)
Physical today 06/16/22  Colonoscopy 09/2006.  Barium ok.  Discussed f/u colonoscopy/cologuard again today.  She wants to hold at this time.  Follow.  Mammogram - bilateral diagnostic - Birads I - 04/2019.  Scheduled for f/u mammogram 07/19/22.

## 2022-06-16 NOTE — Progress Notes (Signed)
Subjective:    Patient ID: Cathy Vazquez, female    DOB: 1940/11/19, 82 y.o.   MRN: [id number]  Patient here for  Chief Complaint  Patient presents with   Annual Exam    HPI Here for a physical exam.  Reports she is doing relatively well.  Started farxiga yesterday. Stopped tresiba - since starting farxiga.  Tries to stay active.  No chest pain or sob reported.  No increased cough or congestion.  On Benicar 40mg  q day.  Blood pressures 130-140/70s.  No abdominal pain.  No bowel change reported.    Past Medical History:  Diagnosis Date   Anemia    Diverticulosis    GERD (gastroesophageal reflux disease)    Glaucoma    Hypercholesterolemia    Hypertension    Thyroid nodule    Past Surgical History:  Procedure Laterality Date   ABDOMINAL HYSTERECTOMY  1987   fibroids, ovaries not removed   TONSILLECTOMY     Family History  Problem Relation Age of Onset   Hypertension Mother    Hypercholesterolemia Mother    Rheumatic fever Mother    Diabetes Father    Hypertension Father    Hypercholesterolemia Father    Heart disease Father        CABG   Diabetes Sister    Hypertension Brother    Breast cancer Maternal Aunt    Breast cancer Cousin    Colon cancer Neg Hx    Social History   Socioeconomic History   Marital status: Divorced    Spouse name: Not on file   Number of children: 2   Years of education: Not on file   Highest education level: Not on file  Occupational History   Not on file  Tobacco Use   Smoking status: Never   Smokeless tobacco: Never  Vaping Use   Vaping Use: Never used  Substance and Sexual Activity   Alcohol use: No    Alcohol/week: 0.0 standard drinks of alcohol   Drug use: No   Sexual activity: Not Currently  Other Topics Concern   Not on file  Social History Narrative   Not on file   Social Determinants of Health   Financial Resource Strain: Low Risk  (05/23/2022)   Overall Financial Resource Strain (CARDIA)    Difficulty of  Paying Living Expenses: Not hard at all  Food Insecurity: No Food Insecurity (05/23/2022)   Hunger Vital Sign    Worried About Running Out of Food in the Last Year: Never true    Ran Out of Food in the Last Year: Never true  Transportation Needs: No Transportation Needs (05/23/2022)   PRAPARE - Administrator, Civil Service (Medical): No    Lack of Transportation (Non-Medical): No  Physical Activity: Unknown (05/23/2022)   Exercise Vital Sign    Days of Exercise per Week: 0 days    Minutes of Exercise per Session: Not on file  Stress: No Stress Concern Present (05/23/2022)   [employer] of Occupational Health - Occupational Stress Questionnaire    Feeling of Stress : Not at all  Social Connections: Unknown (05/23/2022)   Social Connection and Isolation Panel [NHANES]    Frequency of Communication with Friends and Family: More than three times a week    Frequency of Social Gatherings with Friends and Family: More than three times a week    Attends Religious Services: Not on file    Active Member of Clubs or Organizations: Not on  file    Attends Banker Meetings: Not on file    Marital Status: Not on file     Review of Systems  Constitutional:  Negative for appetite change and unexpected weight change.  HENT:  Negative for congestion, sinus pressure and sore throat.   Eyes:  Negative for pain and visual disturbance.  Respiratory:  Negative for cough, chest tightness and shortness of breath.   Cardiovascular:  Negative for chest pain and palpitations.  Gastrointestinal:  Negative for abdominal pain, diarrhea, nausea and vomiting.  Genitourinary:  Negative for difficulty urinating and dysuria.  Musculoskeletal:  Negative for joint swelling and myalgias.  Skin:  Negative for color change and rash.  Neurological:  Negative for dizziness and headaches.  Hematological:  Negative for adenopathy. Does not bruise/bleed easily.  Psychiatric/Behavioral:  Negative for  agitation and dysphoric mood.        Objective:     BP 132/76   Pulse 96   Temp 98 F (36.7 C)   Resp 16   Ht 5\' 3"  (1.6 m)   Wt 176 lb 3.2 oz (79.9 kg)   LMP 02/21/1984   SpO2 98%   BMI 31.21 kg/m  Wt Readings from Last 3 Encounters:  06/16/22 176 lb 3.2 oz (79.9 kg)  05/23/22 177 lb (80.3 kg)  03/11/22 177 lb (80.3 kg)    Physical Exam Vitals reviewed.  Constitutional:      General: She is not in acute distress.    Appearance: Normal appearance.  HENT:     Head: Normocephalic and atraumatic.     Right Ear: External ear normal.     Left Ear: External ear normal.  Eyes:     General: No scleral icterus.       Right eye: No discharge.        Left eye: No discharge.     Conjunctiva/sclera: Conjunctivae normal.  Neck:     Thyroid: No thyromegaly.  Cardiovascular:     Rate and Rhythm: Normal rate and regular rhythm.  Pulmonary:     Effort: No respiratory distress.     Breath sounds: Normal breath sounds. No wheezing.     Comments: Breasts:  no nipple discharge.  Bilateral nipple inversion - unchanged (chronic).  Could not appreciate distinct nodules or axillary adenopathy.   Abdominal:     General: Bowel sounds are normal.     Palpations: Abdomen is soft.     Tenderness: There is no abdominal tenderness.  Musculoskeletal:        General: No swelling or tenderness.     Cervical back: Neck supple. No tenderness.  Lymphadenopathy:     Cervical: No cervical adenopathy.  Skin:    Findings: No erythema or rash.  Neurological:     Mental Status: She is alert.  Psychiatric:        Mood and Affect: Mood normal.        Behavior: Behavior normal.      Outpatient Encounter Medications as of 06/16/2022  Medication Sig   dapagliflozin propanediol (FARXIGA) 5 MG TABS tablet Take 1 tablet (5 mg total) by mouth daily before breakfast.   acetaminophen (TYLENOL) 325 MG tablet Take 650 mg by mouth every 4 (four) hours as needed.   Alpha-Lipoic Acid 200 MG CAPS Take 200 mg  by mouth daily.   Bioflavonoid Products (BIOFLEX PO) Take 1 tablet by mouth daily.    Continuous Blood Gluc Receiver (FREESTYLE LIBRE 2 READER) DEVI 1 application. by Does not apply  route every 14 (fourteen) days.   Continuous Blood Gluc Sensor (FREESTYLE LIBRE 2 SENSOR) MISC APPLY 1 EVERY 14 (FOURTEEN) DAYS.   fish oil-omega-3 fatty acids 1000 MG capsule Take 1 capsule (1 g total) by mouth daily.   Insulin Pen Needle (PEN NEEDLES) 32G X 4 MM MISC Used to give daily insulin injections.   Multiple Vitamin (MULTIVITAMIN) tablet Take 1 tablet by mouth daily.   olmesartan (BENICAR) 40 MG tablet TAKE 1 TABLET BY MOUTH EVERY DAY   pantoprazole (PROTONIX) 40 MG tablet Take 1 tablet (40 mg total) by mouth daily.   [DISCONTINUED] insulin degludec (TRESIBA FLEXTOUCH) 100 UNIT/ML FlexTouch Pen Inject 10 Units into the skin daily. (Patient not taking: Reported on 06/16/2022)   [DISCONTINUED] pantoprazole (PROTONIX) 40 MG tablet TAKE 1 TABLET BY MOUTH EVERY DAY   Facility-Administered Encounter Medications as of 06/16/2022  Medication   cyanocobalamin ((VITAMIN B-12)) injection 1,000 mcg     Lab Results  Component Value Date   WBC 10.0 03/08/2022   HGB 12.7 03/08/2022   HCT 38.7 03/08/2022   PLT 282.0 03/08/2022   GLUCOSE 134 (H) 06/13/2022   CHOL 197 06/13/2022   TRIG 132.0 06/13/2022   HDL 37.50 (L) 06/13/2022   LDLDIRECT 130.0 07/15/2021   LDLCALC 133 (H) 06/13/2022   ALT 16 06/13/2022   AST 14 06/13/2022   NA 135 06/13/2022   K 4.7 06/13/2022   CL 100 06/13/2022   CREATININE 0.92 06/13/2022   BUN 17 06/13/2022   CO2 27 06/13/2022   TSH 2.15 06/13/2022   HGBA1C 7.6 (H) 06/13/2022   MICROALBUR 3.3 (H) 07/29/2021    No results found.     Assessment & Plan:  Routine general medical examination at a health care facility  Hypercholesteremia Assessment & Plan: [facility] Lipid diet.  Have discussed calculated cholesterol risk and need for cholesterol medication.  Declines.  Follow lipid  panel.   Orders: -     Lipid panel; Future -     Hepatic function panel; Future -     Basic metabolic panel; Future  Type 2 diabetes mellitus with hyperglycemia, without long-term current use of insulin (HCC) Assessment & Plan: a1c - 7.6 on recent check.  Improved.   Slight decrease from last check. Discussed low carb diet and exercise.  Off metformin. Intolerance. Just started farxiga 5mg  q day yesterday.  Off tresiba. Follow met b and A1c.   Orders: -     Hemoglobin A1c; Future -     Microalbumin / creatinine urine ratio; Future  Health care maintenance Assessment & Plan: Physical today 06/16/22  Colonoscopy 09/2006.  Barium ok.  Discussed f/u colonoscopy/cologuard again today.  She wants to hold at this time.  Follow.  Mammogram - bilateral diagnostic - [facility]ads I - 04/2019.  Scheduled for f/u mammogram 07/19/22.     Visit for screening mammogram -     3D Screening Mammogram, Left and Right; Future  Anemia, unspecified type Assessment & Plan: Follow cbc.    Stage 3a chronic kidney disease (HCC) Assessment & Plan: Avoid antiinflammatories.  Follow metabolic panel.  Continue benicar 40mg  q day.  Just started farxiga 5mg  q day.  Follow pressures.  Follow metabolic panel.    Primary hypertension Assessment & Plan: Benicar increased to 40mg  q day previously. States blood pressures at home averaging 130/70s.  Continue current medication.  Follow pressures.  Follow metabolic panel. Started farxiga yesterday.    Multiple thyroid nodules Assessment & Plan: Has seen endocrinology previously.  Follow  tsh.  Last ultrasound stable.  D/w her if agreeable for f/u thyroid.   Orders: -     TSH; Future -     T4, free; Future  Other orders -     Pantoprazole Sodium; Take 1 tablet (40 mg total) by mouth daily.  Dispense: 90 tablet; Refill: 1     Dale Thendara, MD

## 2022-06-19 ENCOUNTER — Telehealth: Payer: Self-pay | Admitting: Internal Medicine

## 2022-06-19 ENCOUNTER — Encounter: Payer: Self-pay | Admitting: Internal Medicine

## 2022-06-19 NOTE — Telephone Encounter (Signed)
Reviewed chart, she previously had seen endocrinology for f/u thyroid.  Ultrasound previously had revealed no significant change.  See if she would be agreeable for f/u thyroid ultrasound.  If agreeable, I will order.  Thanks.

## 2022-06-19 NOTE — Assessment & Plan Note (Signed)
Follow cbc.  

## 2022-06-19 NOTE — Assessment & Plan Note (Signed)
D[facility] Lipid diet.  Have discussed calculated cholesterol risk and need for cholesterol medication.  Declines.  Follow lipid panel.  

## 2022-06-19 NOTE — Assessment & Plan Note (Signed)
Has seen endocrinology previously.  Follow tsh.  Last ultrasound stable.  D/w her if agreeable for f/u thyroid.

## 2022-06-19 NOTE — Assessment & Plan Note (Addendum)
Benicar increased to 40mg  q day previously. States blood pressures at home averaging 130/70s.  Continue current medication.  Follow pressures.  Follow metabolic panel. Started farxiga yesterday.

## 2022-06-19 NOTE — Assessment & Plan Note (Signed)
a1c - 7.6 on recent check.  Improved.   Slight decrease from last check. Discussed low carb diet and exercise.  Off metformin. Intolerance. Just started farxiga 5mg  q day yesterday.  Off tresiba. Follow met b and A1c.

## 2022-06-19 NOTE — Assessment & Plan Note (Signed)
Avoid antiinflammatories.  Follow metabolic panel.  Continue benicar 40mg  q day.  Just started farxiga 5mg  q day.  Follow pressures.  Follow metabolic panel.

## 2022-06-20 NOTE — Telephone Encounter (Signed)
Patient says she will think about it and let me know.

## 2022-07-13 ENCOUNTER — Telehealth: Payer: Self-pay

## 2022-07-13 NOTE — Telephone Encounter (Signed)
Received tresiba for patient. She is not on this medication anymore. Placed in refrigerator with X on bag so it can be logged in PAR book.

## 2022-07-15 NOTE — Telephone Encounter (Signed)
Done, thanks

## 2022-07-26 ENCOUNTER — Encounter: Payer: Self-pay | Admitting: Internal Medicine

## 2022-07-26 NOTE — Telephone Encounter (Signed)
Thank you for calling and clarifying her symptoms.  Per review, feels weak.  Ok to stop [employer] manager.  Confirm her blood pressure and blood sugars are ok.  Call with update.  Any acute symptoms, needs to be seen.

## 2022-07-26 NOTE — Telephone Encounter (Signed)
Patient aware of below. Patient has not checked BP but CBG ok. Will start checking pressures and call with update. Agreed to be evaluated if no improvement or symptoms worsen.

## 2022-07-26 NOTE — Telephone Encounter (Signed)
Called patient to confirm ok. She says her main complaint is that she is just achy all over. Mainly in her feet and legs. She says she is not really weak but more tired than she normally feels. She is able to walk with out being unsteady but she does not walk as much because her feet and legs are bothering her. Confirmed eating and drinking. Denies any other acute symptoms. Wants to know if she can stop farxiga and see if these symptoms resolve.

## 2022-07-29 ENCOUNTER — Encounter: Payer: Self-pay | Admitting: Internal Medicine

## 2022-07-29 ENCOUNTER — Other Ambulatory Visit: Payer: Self-pay | Admitting: Internal Medicine

## 2022-07-29 NOTE — Telephone Encounter (Signed)
Please refuse- she is on 40 mg q day

## 2022-08-01 NOTE — Telephone Encounter (Signed)
Just need a little more clarification.  Please confirm why she needs the antibiotic.  (Reason for prophylaxis).  Also confirm who has been prescribing previously and what she has been taking (dose and how taking).  Also, please confirm she is doing ok.  (See last message - was not feeling well)

## 2022-08-01 NOTE — Telephone Encounter (Signed)
FYI-  Patient doing ok. She was trying to request the medication through her other my chart for Dr Tresa Endo but it would not let her so she thought he has refused referral. He has prescribed the abx in the past because he did her joint replacement. She Is going to call their office and request refill and let me know if any problems. Confirmed doing ok.

## 2022-08-03 ENCOUNTER — Telehealth: Payer: Self-pay | Admitting: Internal Medicine

## 2022-08-03 NOTE — Telephone Encounter (Signed)
Patient called and said she needed to speak to Dr Roby Lofts nurse about her BP readings.

## 2022-08-03 NOTE — Telephone Encounter (Signed)
LMTCB

## 2022-08-09 NOTE — Telephone Encounter (Signed)
LMTCB

## 2022-08-11 NOTE — Telephone Encounter (Signed)
Patient was not calling about her blood pressure. She stopped farxiga 6/4 and was wondering what you wanted her to take for her diabetes or if she should retry farxiga. Sugars are 150-190. She has f/u with you 7/26 with labs before. Do you want her to continue to monitor and discuss treatment options at appt or move appt up?

## 2022-08-11 NOTE — Telephone Encounter (Signed)
Given the significant symptoms she had with farxiga, would hold on restarting.  Check and record blood sugars.  Schedule earlier appt to discuss treatment options.  Confirm doing ok.

## 2022-08-12 NOTE — Telephone Encounter (Signed)
Pt aware & doing well. Needed a afternoon appt, so appt was moved to 2:30 on 09/01/22

## 2022-08-24 ENCOUNTER — Other Ambulatory Visit: Payer: Self-pay | Admitting: Internal Medicine

## 2022-09-01 ENCOUNTER — Ambulatory Visit: Payer: PPO | Admitting: Internal Medicine

## 2022-09-14 ENCOUNTER — Other Ambulatory Visit (INDEPENDENT_AMBULATORY_CARE_PROVIDER_SITE_OTHER): Payer: PPO

## 2022-09-14 DIAGNOSIS — E1165 Type 2 diabetes mellitus with hyperglycemia: Secondary | ICD-10-CM

## 2022-09-14 DIAGNOSIS — E7800 Pure hypercholesterolemia, unspecified: Secondary | ICD-10-CM

## 2022-09-14 LAB — HEPATIC FUNCTION PANEL
ALT: 21 U/L (ref 0–35)
AST: 17 U/L (ref 0–37)
Albumin: 4.4 g/dL (ref 3.5–5.2)
Alkaline Phosphatase: 54 U/L (ref 39–117)
Bilirubin, Direct: 0.1 mg/dL (ref 0.0–0.3)
Total Bilirubin: 0.5 mg/dL (ref 0.2–1.2)
Total Protein: 7.5 g/dL (ref 6.0–8.3)

## 2022-09-14 LAB — BASIC METABOLIC PANEL
BUN: 28 mg/dL — ABNORMAL HIGH (ref 6–23)
CO2: 27 mEq/L (ref 19–32)
Calcium: 9.8 mg/dL (ref 8.4–10.5)
Chloride: 99 mEq/L (ref 96–112)
Creatinine, Ser: 1.03 mg/dL (ref 0.40–1.20)
GFR: 50.08 mL/min — ABNORMAL LOW (ref >60.00)
Glucose, Bld: 174 mg/dL — ABNORMAL HIGH (ref 70–99)
Potassium: 4.8 mEq/L (ref 3.5–5.1)
Sodium: 134 mEq/L — ABNORMAL LOW (ref 135–145)

## 2022-09-14 LAB — LIPID PANEL
Cholesterol: 197 mg/dL (ref 0–200)
HDL: 36.3 mg/dL — ABNORMAL LOW (ref >39.00)
LDL Cholesterol: 124 mg/dL — ABNORMAL HIGH (ref 0–99)
NonHDL: 160.48
Total CHOL/HDL Ratio: 5
Triglycerides: 184 mg/dL — ABNORMAL HIGH (ref 0.0–149.0)
VLDL: 36.8 mg/dL (ref 0.0–40.0)

## 2022-09-14 LAB — HEMOGLOBIN A1C: Hgb A1c MFr Bld: 7.6 % — ABNORMAL HIGH (ref 4.6–6.5)

## 2022-09-16 ENCOUNTER — Ambulatory Visit: Payer: PPO | Admitting: Internal Medicine

## 2022-09-21 ENCOUNTER — Encounter (INDEPENDENT_AMBULATORY_CARE_PROVIDER_SITE_OTHER): Payer: Self-pay

## 2022-10-02 ENCOUNTER — Other Ambulatory Visit: Payer: Self-pay | Admitting: Internal Medicine

## 2022-10-02 DIAGNOSIS — E1165 Type 2 diabetes mellitus with hyperglycemia: Secondary | ICD-10-CM

## 2022-10-07 ENCOUNTER — Ambulatory Visit: Payer: PPO | Admitting: Internal Medicine

## 2022-10-07 VITALS — BP 140/86 | HR 88 | Temp 98.2°F | Resp 16 | Ht 63.0 in | Wt 176.2 lb

## 2022-10-07 DIAGNOSIS — E7800 Pure hypercholesterolemia, unspecified: Secondary | ICD-10-CM

## 2022-10-07 DIAGNOSIS — N1831 Chronic kidney disease, stage 3a: Secondary | ICD-10-CM | POA: Diagnosis not present

## 2022-10-07 DIAGNOSIS — M25569 Pain in unspecified knee: Secondary | ICD-10-CM | POA: Diagnosis not present

## 2022-10-07 DIAGNOSIS — I10 Essential (primary) hypertension: Secondary | ICD-10-CM

## 2022-10-07 DIAGNOSIS — E042 Nontoxic multinodular goiter: Secondary | ICD-10-CM | POA: Diagnosis not present

## 2022-10-07 DIAGNOSIS — E1165 Type 2 diabetes mellitus with hyperglycemia: Secondary | ICD-10-CM | POA: Diagnosis not present

## 2022-10-07 DIAGNOSIS — D649 Anemia, unspecified: Secondary | ICD-10-CM | POA: Diagnosis not present

## 2022-10-07 DIAGNOSIS — E538 Deficiency of other specified B group vitamins: Secondary | ICD-10-CM

## 2022-10-07 NOTE — Progress Notes (Unsigned)
Subjective:    Patient ID: Cathy Vazquez, female    DOB: 1941-01-16, 82 y.o.   MRN: [id number]  Patient here for  Chief Complaint  Patient presents with   Medical Management of Chronic Issues    HPI Here to follow up regarding hypercholesterolemia, diabetes and hypertension.  Reports having increased pain - left knee.  She injured her knee 20+ years ago.  Since the initial injury, has not had significant issues until about one month ago.  Increased pain - anterior knee - notices when sitting and with stairs.  Also describes stiffness - walking.  Has appt with [facility] clinic - Dedra Skeens.  Reports otherwise feeling good.  No chest pain or sob reported.  No abdominal pain or bowel change.  She is not taking farxiga or tresiba now.  Evaristo Bury was stopped due to improvement of sugars and starting farxiga.  She stopped farxiga due to "intolerance".  On questioning, she had been tolerating farxiga, but had one day where she felt weak.  Did not feel well.  Stopped farxiga and has not been on anything since.  This was 2 months ago.  Reviewed sugar readings.  Time active 64%.  Target range 30%, high 59% and very high 11%.  Discussed diet and exercise.  She has not been watching her diet as well.  Discussed restarting tresiba or farxiga.  She is agreeable to restart farxiga, given only one day she experienced symptoms.  Discussed importance of staying hydrated.     Past Medical History:  Diagnosis Date   Anemia    Diverticulosis    GERD (gastroesophageal reflux disease)    Glaucoma    Hypercholesterolemia    Hypertension    Thyroid nodule    Past Surgical History:  Procedure Laterality Date   ABDOMINAL HYSTERECTOMY  1987   fibroids, ovaries not removed   TONSILLECTOMY     Family History  Problem Relation Age of Onset   Hypertension Mother    Hypercholesterolemia Mother    Rheumatic fever Mother    Diabetes Father    Hypertension Father    Hypercholesterolemia Father    Heart  disease Father        CABG   Diabetes Sister    Hypertension Brother    Breast cancer Maternal Aunt    Breast cancer Cousin    Colon cancer Neg Hx    Social History   Socioeconomic History   Marital status: Divorced    Spouse name: Not on file   Number of children: 2   Years of education: Not on file   Highest education level: Not on file  Occupational History   Not on file  Tobacco Use   Smoking status: Never   Smokeless tobacco: Never  Vaping Use   Vaping status: Never Used  Substance and Sexual Activity   Alcohol use: No    Alcohol/week: 0.0 standard drinks of alcohol   Drug use: No   Sexual activity: Not Currently  Other Topics Concern   Not on file  Social History Narrative   Not on file   Social Determinants of Health   Financial Resource Strain: Low Risk  (05/23/2022)   Overall Financial Resource Strain (CARDIA)    Difficulty of Paying Living Expenses: Not hard at all  Food Insecurity: No Food Insecurity (05/23/2022)   Hunger Vital Sign    Worried About Running Out of Food in the Last Year: Never true    Ran Out of Food in the Last  Year: Never true  Transportation Needs: No Transportation Needs (05/23/2022)   PRAPARE - Administrator, Civil Service (Medical): No    Lack of Transportation (Non-Medical): No  Physical Activity: Unknown (05/23/2022)   Exercise Vital Sign    Days of Exercise per Week: 0 days    Minutes of Exercise per Session: Not on file  Stress: No Stress Concern Present (05/23/2022)   [employer] of Occupational Health - Occupational Stress Questionnaire    Feeling of Stress : Not at all  Social Connections: Unknown (05/23/2022)   Social Connection and Isolation Panel [NHANES]    Frequency of Communication with Friends and Family: More than three times a week    Frequency of Social Gatherings with Friends and Family: More than three times a week    Attends Religious Services: Not on Marketing executive or Organizations:  Not on file    Attends Banker Meetings: Not on file    Marital Status: Not on file     Review of Systems  Constitutional:  Negative for appetite change and unexpected weight change.  HENT:  Negative for congestion and sinus pressure.   Respiratory:  Negative for cough, chest tightness and shortness of breath.   Cardiovascular:  Negative for chest pain, palpitations and leg swelling.  Gastrointestinal:  Negative for abdominal pain, diarrhea, nausea and vomiting.  Genitourinary:  Negative for difficulty urinating and dysuria.  Musculoskeletal:  Negative for myalgias.       Left knee pain as outlined.   Skin:  Negative for color change and rash.  Neurological:  Negative for dizziness and headaches.  Psychiatric/Behavioral:  Negative for agitation and dysphoric mood.        Objective:     BP (!) 140/86   Pulse 88   Temp 98.2 F (36.8 C)   Resp 16   Ht 5\' 3"  (1.6 m)   Wt 176 lb 3.2 oz (79.9 kg)   LMP 02/21/1984   SpO2 97%   BMI 31.21 kg/m  Wt Readings from Last 3 Encounters:  10/07/22 176 lb 3.2 oz (79.9 kg)  06/16/22 176 lb 3.2 oz (79.9 kg)  05/23/22 177 lb (80.3 kg)    Physical Exam Vitals reviewed.  Constitutional:      General: She is not in acute distress.    Appearance: Normal appearance.  HENT:     Head: Normocephalic and atraumatic.     Right Ear: External ear normal.     Left Ear: External ear normal.  Eyes:     General: No scleral icterus.       Right eye: No discharge.        Left eye: No discharge.     Conjunctiva/sclera: Conjunctivae normal.  Neck:     Thyroid: No thyromegaly.  Cardiovascular:     Rate and Rhythm: Normal rate and regular rhythm.  Pulmonary:     Effort: No respiratory distress.     Breath sounds: Normal breath sounds. No wheezing.  Abdominal:     General: Bowel sounds are normal.     Palpations: Abdomen is soft.     Tenderness: There is no abdominal tenderness.  Musculoskeletal:        General: No swelling or  tenderness.     Cervical back: Neck supple. No tenderness.  Lymphadenopathy:     Cervical: No cervical adenopathy.  Skin:    Findings: No erythema or rash.  Neurological:     Mental Status:  She is alert.  Psychiatric:        Mood and Affect: Mood normal.        Behavior: Behavior normal.      Outpatient Encounter Medications as of 10/07/2022  Medication Sig   acetaminophen (TYLENOL) 325 MG tablet Take 650 mg by mouth every 4 (four) hours as needed.   Alpha-Lipoic Acid 200 MG CAPS Take 200 mg by mouth daily.   Bioflavonoid Products (BIOFLEX PO) Take 1 tablet by mouth daily.    Continuous Blood Gluc Receiver (FREESTYLE LIBRE 2 READER) DEVI 1 application. by Does not apply route every 14 (fourteen) days.   Continuous Glucose Sensor (FREESTYLE LIBRE 2 SENSOR) MISC APPLY 1 EVERY 14 (FOURTEEN) DAYS.   dapagliflozin propanediol (FARXIGA) 5 MG TABS tablet Take 1 tablet (5 mg total) by mouth daily before breakfast.   fish oil-omega-3 fatty acids 1000 MG capsule Take 1 capsule (1 g total) by mouth daily.   Insulin Pen Needle (PEN NEEDLES) 32G X 4 MM MISC Used to give daily insulin injections.   Multiple Vitamin (MULTIVITAMIN) tablet Take 1 tablet by mouth daily.   olmesartan (BENICAR) 40 MG tablet TAKE 1 TABLET BY MOUTH EVERY DAY   pantoprazole (PROTONIX) 40 MG tablet Take 1 tablet (40 mg total) by mouth daily.   Facility-Administered Encounter Medications as of 10/07/2022  Medication   cyanocobalamin ((VITAMIN B-12)) injection 1,000 mcg     Lab Results  Component Value Date   WBC 10.0 03/08/2022   HGB 12.7 03/08/2022   HCT 38.7 03/08/2022   PLT 282.0 03/08/2022   GLUCOSE 174 (H) 09/14/2022   CHOL 197 09/14/2022   TRIG 184.0 (H) 09/14/2022   HDL 36.30 (L) 09/14/2022   LDLDIRECT 130.0 07/15/2021   LDLCALC 124 (H) 09/14/2022   ALT 21 09/14/2022   AST 17 09/14/2022   NA 134 (L) 09/14/2022   K 4.8 09/14/2022   CL 99 09/14/2022   CREATININE 1.03 09/14/2022   BUN 28 (H) 09/14/2022    CO2 27 09/14/2022   TSH 2.15 06/13/2022   HGBA1C 7.6 (H) 09/14/2022   MICROALBUR 3.3 (H) 07/29/2021    No results found.     Assessment & Plan:  Type 2 diabetes mellitus with hyperglycemia, without long-term current use of insulin (HCC) Assessment & Plan: a1c - 7.6 on last check.  Improved.   Sugars reviewed.  Increasing as outlined. Discussed low carb diet and exercise.  Off metformin. Intolerance. Has been off farxiga.  Discussed.  Agreeable to restart.  Will restart 5mg  q day.  Remain off tresiba since restarting farxiga.  Stay hydrated. Follow met b and A1c.   Orders: -     Hemoglobin A1c; Future -     Basic metabolic panel; Future -     Microalbumin / creatinine urine ratio; Future  Multiple thyroid nodules Assessment & Plan: Has seen endocrinology previously.  Follow tsh.  Last ultrasound stable.  D/w her if agreeable for f/u thyroid.   Orders: -     TSH; Future -     T4, free; Future  Knee pain, unspecified chronicity, unspecified laterality Assessment & Plan: Left knee pain as outlined.  Has appt with ortho.  Follow.    Primary hypertension Assessment & Plan: Benicar 40mg  q day. States blood pressures at home averaging 140/70s. Continue current medication.  Follow pressures.  Follow metabolic panel.  Restart farxiga.    Hypercholesteremia Assessment & Plan: [facility] Lipid diet.  Have discussed calculated cholesterol risk and need for cholesterol medication.  Declines.  Follow lipid panel.   Orders: -     Hepatic function panel; Future -     Lipid panel; Future  Stage 3a chronic kidney disease (HCC) Assessment & Plan: Avoid antiinflammatories.  Continue benicar 40mg  q day.  Restart farxiga 5mg  q day.  Follow pressures.  Follow metabolic panel. Stay hydrated.    B12 deficiency Assessment & Plan: Recheck B12 level with next labs.   Orders: -     Vitamin B12; Future  Anemia, unspecified type Assessment & Plan: Follow cbc.       Dale Orason, MD

## 2022-10-07 NOTE — Patient Instructions (Signed)
Restart farxiga

## 2022-10-08 ENCOUNTER — Encounter: Payer: Self-pay | Admitting: Internal Medicine

## 2022-10-08 NOTE — Assessment & Plan Note (Signed)
Recheck B12 level with next labs.  

## 2022-10-08 NOTE — Assessment & Plan Note (Signed)
Benicar 40mg  q day. States blood pressures at home averaging 140/70s. Continue current medication.  Follow pressures.  Follow metabolic panel.  Restart farxiga.

## 2022-10-08 NOTE — Assessment & Plan Note (Signed)
a1c - 7.6 on last check.  Improved.   Sugars reviewed.  Increasing as outlined. Discussed low carb diet and exercise.  Off metformin. Intolerance. Has been off farxiga.  Discussed.  Agreeable to restart.  Will restart 5mg  q day.  Remain off tresiba since restarting farxiga.  Stay hydrated. Follow met b and A1c.

## 2022-10-08 NOTE — Assessment & Plan Note (Signed)
D[facility] Lipid diet.  Have discussed calculated cholesterol risk and need for cholesterol medication.  Declines.  Follow lipid panel.  

## 2022-10-08 NOTE — Assessment & Plan Note (Addendum)
Avoid antiinflammatories.  Continue benicar 40mg  q day.  Restart farxiga 5mg  q day.  Follow pressures.  Follow metabolic panel. Stay hydrated.

## 2022-10-08 NOTE — Assessment & Plan Note (Signed)
Left knee pain as outlined.  Has appt with ortho.  Follow.

## 2022-10-08 NOTE — Assessment & Plan Note (Signed)
Has seen endocrinology previously.  Follow tsh.  Last ultrasound stable.  D/w her if agreeable for f/u thyroid.

## 2022-10-08 NOTE — Assessment & Plan Note (Signed)
Follow cbc.  

## 2022-10-10 ENCOUNTER — Encounter: Payer: Self-pay | Admitting: *Deleted

## 2022-10-18 ENCOUNTER — Telehealth: Payer: Self-pay | Admitting: Internal Medicine

## 2022-10-18 DIAGNOSIS — M25562 Pain in left knee: Secondary | ICD-10-CM

## 2022-10-18 NOTE — Telephone Encounter (Signed)
Patient called and asked Can Dr. Lorin Picket refer her to a orthopedics. Her number is [phone].

## 2022-10-19 NOTE — Telephone Encounter (Signed)
Ok to refer to Cathy Vazquez for left knee per patient request ? We discussed this at her last appt and I forgot to place referral for you. Apologized to patient for the delay.

## 2022-10-19 NOTE — Telephone Encounter (Signed)
Referral placed. Pt aware. 

## 2022-10-19 NOTE — Telephone Encounter (Signed)
Ok

## 2022-10-20 ENCOUNTER — Telehealth: Payer: Self-pay | Admitting: *Deleted

## 2022-10-20 NOTE — Telephone Encounter (Signed)
Spoke with representative Dynesha & discontinued Evaristo Bury & pen needles shipments from patient assistance.

## 2022-10-31 ENCOUNTER — Other Ambulatory Visit: Payer: Self-pay | Admitting: Internal Medicine

## 2022-11-03 DIAGNOSIS — M170 Bilateral primary osteoarthritis of knee: Secondary | ICD-10-CM | POA: Diagnosis not present

## 2022-11-03 DIAGNOSIS — E1165 Type 2 diabetes mellitus with hyperglycemia: Secondary | ICD-10-CM | POA: Diagnosis not present

## 2022-11-18 ENCOUNTER — Ambulatory Visit: Payer: PPO | Admitting: Internal Medicine

## 2022-12-14 ENCOUNTER — Ambulatory Visit (INDEPENDENT_AMBULATORY_CARE_PROVIDER_SITE_OTHER): Payer: PPO | Admitting: Internal Medicine

## 2022-12-14 VITALS — BP 134/74 | HR 90 | Temp 98.2°F | Resp 16 | Ht 63.0 in | Wt 173.2 lb

## 2022-12-14 DIAGNOSIS — I10 Essential (primary) hypertension: Secondary | ICD-10-CM

## 2022-12-14 DIAGNOSIS — E042 Nontoxic multinodular goiter: Secondary | ICD-10-CM

## 2022-12-14 DIAGNOSIS — N1831 Chronic kidney disease, stage 3a: Secondary | ICD-10-CM

## 2022-12-14 DIAGNOSIS — E7800 Pure hypercholesterolemia, unspecified: Secondary | ICD-10-CM

## 2022-12-14 DIAGNOSIS — Z7984 Long term (current) use of oral hypoglycemic drugs: Secondary | ICD-10-CM

## 2022-12-14 DIAGNOSIS — M25569 Pain in unspecified knee: Secondary | ICD-10-CM | POA: Diagnosis not present

## 2022-12-14 DIAGNOSIS — E1165 Type 2 diabetes mellitus with hyperglycemia: Secondary | ICD-10-CM

## 2022-12-14 NOTE — Progress Notes (Signed)
Subjective:    Patient ID: Cathy Vazquez, female    DOB: 1941/02/06, 82 y.o.   MRN: [id number]  Patient here for  Chief Complaint  Patient presents with   Medical Management of Chronic Issues    HPI Here to follow up regarding hypercholesterolemia, diabetes and hypertension. Reviewed sugar readings. Time active 82% - for the time 11/30/22 - 12/13/22. Target range 45%, high 48% and very high 7%. No low sugars. Restarted farxiga last visit.  Tolerating.  Tries to stay active.  No chest pain or sob reported.  No cough or congestion.  No abdominal pain or bowel change reported.  Saw ortho for her knee.  S/p injection. Is better.     Past Medical History:  Diagnosis Date   Anemia    Diverticulosis    GERD (gastroesophageal reflux disease)    Glaucoma    Hypercholesterolemia    Hypertension    Thyroid nodule    Past Surgical History:  Procedure Laterality Date   ABDOMINAL HYSTERECTOMY  1987   fibroids, ovaries not removed   TONSILLECTOMY     Family History  Problem Relation Age of Onset   Hypertension Mother    Hypercholesterolemia Mother    Rheumatic fever Mother    Diabetes Father    Hypertension Father    Hypercholesterolemia Father    Heart disease Father        CABG   Diabetes Sister    Hypertension Brother    Breast cancer Maternal Aunt    Breast cancer Cousin    Colon cancer Neg Hx    Social History   Socioeconomic History   Marital status: Divorced    Spouse name: Not on file   Number of children: 2   Years of education: Not on file   Highest education level: Not on file  Occupational History   Not on file  Tobacco Use   Smoking status: Never   Smokeless tobacco: Never  Vaping Use   Vaping status: Never Used  Substance and Sexual Activity   Alcohol use: No    Alcohol/week: 0.0 standard drinks of alcohol   Drug use: No   Sexual activity: Not Currently  Other Topics Concern   Not on file  Social History Narrative   Not on file   Social  Determinants of Health   Financial Resource Strain: Low Risk  (05/23/2022)   Overall Financial Resource Strain (CARDIA)    Difficulty of Paying Living Expenses: Not hard at all  Food Insecurity: No Food Insecurity (05/23/2022)   Hunger Vital Sign    Worried About Running Out of Food in the Last Year: Never true    Ran Out of Food in the Last Year: Never true  Transportation Needs: No Transportation Needs (05/23/2022)   PRAPARE - Administrator, Civil Service (Medical): No    Lack of Transportation (Non-Medical): No  Physical Activity: Unknown (05/23/2022)   Exercise Vital Sign    Days of Exercise per Week: 0 days    Minutes of Exercise per Session: Not on file  Stress: No Stress Concern Present (05/23/2022)   [employer] of Occupational Health - Occupational Stress Questionnaire    Feeling of Stress : Not at all  Social Connections: Unknown (05/23/2022)   Social Connection and Isolation Panel [NHANES]    Frequency of Communication with Friends and Family: More than three times a week    Frequency of Social Gatherings with Friends and Family: More than three times a  week    Attends Religious Services: Not on file    Active Member of Clubs or Organizations: Not on file    Attends Banker Meetings: Not on file    Marital Status: Not on file     Review of Systems  Constitutional:  Negative for appetite change and unexpected weight change.  HENT:  Negative for congestion and sinus pressure.   Respiratory:  Negative for cough, chest tightness and shortness of breath.   Cardiovascular:  Negative for chest pain and palpitations.  Gastrointestinal:  Negative for abdominal pain, diarrhea, nausea and vomiting.  Genitourinary:  Negative for difficulty urinating and dysuria.  Musculoskeletal:  Negative for joint swelling and myalgias.  Skin:  Negative for color change and rash.  Neurological:  Negative for dizziness and headaches.  Psychiatric/Behavioral:  Negative for  agitation and dysphoric mood.        Objective:     BP 134/74   Pulse 90   Temp 98.2 F (36.8 C)   Resp 16   Ht 5\' 3"  (1.6 m)   Wt 173 lb 3.2 oz (78.6 kg)   LMP 02/21/1984   SpO2 98%   BMI 30.68 kg/m  Wt Readings from Last 3 Encounters:  12/14/22 173 lb 3.2 oz (78.6 kg)  10/07/22 176 lb 3.2 oz (79.9 kg)  06/16/22 176 lb 3.2 oz (79.9 kg)    Physical Exam Vitals reviewed.  Constitutional:      General: She is not in acute distress.    Appearance: Normal appearance.  HENT:     Head: Normocephalic and atraumatic.     Right Ear: External ear normal.     Left Ear: External ear normal.  Eyes:     General: No scleral icterus.       Right eye: No discharge.        Left eye: No discharge.     Conjunctiva/sclera: Conjunctivae normal.  Neck:     Thyroid: No thyromegaly.  Cardiovascular:     Rate and Rhythm: Normal rate and regular rhythm.  Pulmonary:     Effort: No respiratory distress.     Breath sounds: Normal breath sounds. No wheezing.  Abdominal:     General: Bowel sounds are normal.     Palpations: Abdomen is soft.     Tenderness: There is no abdominal tenderness.  Musculoskeletal:        General: No swelling or tenderness.     Cervical back: Neck supple. No tenderness.  Lymphadenopathy:     Cervical: No cervical adenopathy.  Skin:    Findings: No erythema or rash.  Neurological:     Mental Status: She is alert.  Psychiatric:        Mood and Affect: Mood normal.        Behavior: Behavior normal.      Outpatient Encounter Medications as of 12/14/2022  Medication Sig   acetaminophen (TYLENOL) 325 MG tablet Take 650 mg by mouth every 4 (four) hours as needed.   Alpha-Lipoic Acid 200 MG CAPS Take 200 mg by mouth daily.   Bioflavonoid Products (BIOFLEX PO) Take 1 tablet by mouth daily.    Continuous Blood Gluc Receiver (FREESTYLE LIBRE 2 READER) DEVI 1 application. by Does not apply route every 14 (fourteen) days.   Continuous Glucose Sensor (FREESTYLE  LIBRE 2 SENSOR) MISC APPLY 1 EVERY 14 (FOURTEEN) DAYS.   fish oil-omega-3 fatty acids 1000 MG capsule Take 1 capsule (1 g total) by mouth daily.   Multiple Vitamin (  MULTIVITAMIN) tablet Take 1 tablet by mouth daily.   olmesartan (BENICAR) 40 MG tablet TAKE 1 TABLET BY MOUTH EVERY DAY   pantoprazole (PROTONIX) 40 MG tablet Take 1 tablet (40 mg total) by mouth daily.   [DISCONTINUED] dapagliflozin propanediol (FARXIGA) 5 MG TABS tablet Take 1 tablet (5 mg total) by mouth daily before breakfast.   Facility-Administered Encounter Medications as of 12/14/2022  Medication   cyanocobalamin ((VITAMIN B-12)) injection 1,000 mcg     Lab Results  Component Value Date   WBC 10.0 03/08/2022   HGB 12.7 03/08/2022   HCT 38.7 03/08/2022   PLT 282.0 03/08/2022   GLUCOSE 174 (H) 09/14/2022   CHOL 197 09/14/2022   TRIG 184.0 (H) 09/14/2022   HDL 36.30 (L) 09/14/2022   LDLDIRECT 130.0 07/15/2021   LDLCALC 124 (H) 09/14/2022   ALT 21 09/14/2022   AST 17 09/14/2022   NA 134 (L) 09/14/2022   K 4.8 09/14/2022   CL 99 09/14/2022   CREATININE 1.03 09/14/2022   BUN 28 (H) 09/14/2022   CO2 27 09/14/2022   TSH 2.15 06/13/2022   HGBA1C 7.6 (H) 09/14/2022   MICROALBUR 3.3 (H) 07/29/2021       Assessment & Plan:  Stage 3a chronic kidney disease (HCC) Assessment & Plan: Avoid antiinflammatories.  Continue benicar 40mg  q day.  On farxiga.   Follow pressures.  Follow metabolic panel. Stay hydrated.    Hypercholesteremia Assessment & Plan: [facility] Lipid diet.  Have discussed calculated cholesterol risk and need for cholesterol medication.  Declines.  Follow lipid panel.    Primary hypertension Assessment & Plan: Benicar 40mg  q day and on farxiga.  Continue current medication.  Follow pressures.  Follow metabolic panel.     Multiple thyroid nodules Assessment & Plan: Has seen endocrinology previously.  Follow tsh.  Last ultrasound stable.  D/w her if agreeable for f/u thyroid.    Type 2 diabetes  mellitus with hyperglycemia, without long-term current use of insulin (HCC) Assessment & Plan: Sugars reviewed.  Discussed low carb diet and exercise.  Off metformin. Intolerance. Continues on farxiga. Tolerating. . Stay hydrated. Follow met b and A1c.    Knee pain, unspecified chronicity, unspecified laterality Assessment & Plan: Saw ortho.  S/p cortisone injection.       Dale , MD

## 2022-12-15 NOTE — Telephone Encounter (Signed)
Received re-order refill request from [employer] FOR FARXIGA I HAVE UPDATED PT PREFERRED PHARMACY LIST.   PLEASE BE ADVISED I HAVE SCANNED A COPY IN PT MEDIA OF CHART.

## 2022-12-18 ENCOUNTER — Encounter: Payer: Self-pay | Admitting: Internal Medicine

## 2022-12-18 MED ORDER — DAPAGLIFLOZIN PROPANEDIOL 5 MG PO TABS: 5.0000 mg | ORAL_TABLET | Freq: Every day | ORAL | 3 refills | Status: DC

## 2022-12-18 NOTE — Assessment & Plan Note (Signed)
Saw ortho.  S/p cortisone injection.

## 2022-12-18 NOTE — Assessment & Plan Note (Signed)
Has seen endocrinology previously.  Follow tsh.  Last ultrasound stable.  D/w her if agreeable for f/u thyroid.

## 2022-12-18 NOTE — Addendum Note (Signed)
Addended by: Nilda Simmer T on: 12/18/2022 02:42 PM   Modules accepted: Orders

## 2022-12-18 NOTE — Telephone Encounter (Signed)
Order sent to [facility].

## 2022-12-18 NOTE — Assessment & Plan Note (Signed)
Sugars reviewed.  Discussed low carb diet and exercise.  Off metformin. Intolerance. Continues on farxiga. Tolerating. . Stay hydrated. Follow met b and A1c.

## 2022-12-18 NOTE — Assessment & Plan Note (Addendum)
Benicar 40mg  q day and on farxiga.  Continue current medication.  Follow pressures.  Follow metabolic panel.

## 2022-12-18 NOTE — Assessment & Plan Note (Signed)
D[facility] Lipid diet.  Have discussed calculated cholesterol risk and need for cholesterol medication.  Declines.  Follow lipid panel.  

## 2022-12-18 NOTE — Assessment & Plan Note (Signed)
Avoid antiinflammatories.  Continue benicar 40mg  q day.  On farxiga.   Follow pressures.  Follow metabolic panel. Stay hydrated.

## 2023-01-03 NOTE — Progress Notes (Signed)
Received notification that patient has been conditionally approved for 2025. Full letter in Media:

## 2023-01-11 ENCOUNTER — Other Ambulatory Visit (INDEPENDENT_AMBULATORY_CARE_PROVIDER_SITE_OTHER): Payer: PPO

## 2023-01-11 DIAGNOSIS — E7800 Pure hypercholesterolemia, unspecified: Secondary | ICD-10-CM | POA: Diagnosis not present

## 2023-01-11 DIAGNOSIS — E538 Deficiency of other specified B group vitamins: Secondary | ICD-10-CM | POA: Diagnosis not present

## 2023-01-11 DIAGNOSIS — E042 Nontoxic multinodular goiter: Secondary | ICD-10-CM | POA: Diagnosis not present

## 2023-01-11 DIAGNOSIS — E1165 Type 2 diabetes mellitus with hyperglycemia: Secondary | ICD-10-CM

## 2023-01-11 LAB — BASIC METABOLIC PANEL
BUN: 26 mg/dL — ABNORMAL HIGH (ref 6–23)
CO2: 29 meq/L (ref 19–32)
Calcium: 9.8 mg/dL (ref 8.4–10.5)
Chloride: 98 meq/L (ref 96–112)
Creatinine, Ser: 1.07 mg/dL (ref 0.40–1.20)
GFR: 47.73 mL/min — ABNORMAL LOW (ref >60.00)
Glucose, Bld: 230 mg/dL — ABNORMAL HIGH (ref 70–99)
Potassium: 5.1 meq/L (ref 3.5–5.1)
Sodium: 135 meq/L (ref 135–145)

## 2023-01-11 LAB — LIPID PANEL
Cholesterol: 219 mg/dL — ABNORMAL HIGH (ref 0–200)
HDL: 38.8 mg/dL — ABNORMAL LOW (ref >39.00)
LDL Cholesterol: 135 mg/dL — ABNORMAL HIGH (ref 0–99)
NonHDL: 180.2
Total CHOL/HDL Ratio: 6
Triglycerides: 224 mg/dL — ABNORMAL HIGH (ref 0.0–149.0)
VLDL: 44.8 mg/dL — ABNORMAL HIGH (ref 0.0–40.0)

## 2023-01-11 LAB — HEMOGLOBIN A1C: Hgb A1c MFr Bld: 8.7 % — ABNORMAL HIGH (ref 4.6–6.5)

## 2023-01-11 LAB — TSH: TSH: 2.54 u[IU]/mL (ref 0.35–5.50)

## 2023-01-11 LAB — VITAMIN B12: Vitamin B-12: 314 pg/mL (ref 211–911)

## 2023-01-11 LAB — T4, FREE: Free T4: 0.97 ng/dL (ref 0.60–1.60)

## 2023-01-11 LAB — HEPATIC FUNCTION PANEL
ALT: 19 U/L (ref 0–35)
AST: 13 U/L (ref 0–37)
Albumin: 4.6 g/dL (ref 3.5–5.2)
Alkaline Phosphatase: 81 U/L (ref 39–117)
Bilirubin, Direct: 0.1 mg/dL (ref 0.0–0.3)
Total Bilirubin: 0.6 mg/dL (ref 0.2–1.2)
Total Protein: 7.1 g/dL (ref 6.0–8.3)

## 2023-01-13 LAB — MICROALBUMIN / CREATININE URINE RATIO
Creatinine,U: 54.8 mg/dL
Microalb Creat Ratio: 5.2 mg/g (ref 0.0–30.0)
Microalb, Ur: 2.9 mg/dL — ABNORMAL HIGH (ref 0.0–1.9)

## 2023-01-22 NOTE — Progress Notes (Unsigned)
Subjective:    Patient ID: Cathy Vazquez, female    DOB: Feb 05, 1941, 82 y.o.   MRN: [id number]  Patient here for No chief complaint on file.   HPI Here as a work in to discussed recent labs - A1c 8.7.  currently only on farxiga 5mg  q day. Had intolerance to metformin. Recent GFR 47.    Past Medical History:  Diagnosis Date   Anemia    Diverticulosis    GERD (gastroesophageal reflux disease)    Glaucoma    Hypercholesterolemia    Hypertension    Thyroid nodule    Past Surgical History:  Procedure Laterality Date   ABDOMINAL HYSTERECTOMY  1987   fibroids, ovaries not removed   TONSILLECTOMY     Family History  Problem Relation Age of Onset   Hypertension Mother    Hypercholesterolemia Mother    Rheumatic fever Mother    Diabetes Father    Hypertension Father    Hypercholesterolemia Father    Heart disease Father        CABG   Diabetes Sister    Hypertension Brother    Breast cancer Maternal Aunt    Breast cancer Cousin    Colon cancer Neg Hx    Social History   Socioeconomic History   Marital status: Divorced    Spouse name: Not on file   Number of children: 2   Years of education: Not on file   Highest education level: Not on file  Occupational History   Not on file  Tobacco Use   Smoking status: Never   Smokeless tobacco: Never  Vaping Use   Vaping status: Never Used  Substance and Sexual Activity   Alcohol use: No    Alcohol/week: 0.0 standard drinks of alcohol   Drug use: No   Sexual activity: Not Currently  Other Topics Concern   Not on file  Social History Narrative   Not on file   Social Determinants of Health   Financial Resource Strain: Low Risk  (05/23/2022)   Overall Financial Resource Strain (CARDIA)    Difficulty of Paying Living Expenses: Not hard at all  Food Insecurity: No Food Insecurity (05/23/2022)   Hunger Vital Sign    Worried About Running Out of Food in the Last Year: Never true    Ran Out of Food in the Last Year:  Never true  Transportation Needs: No Transportation Needs (05/23/2022)   PRAPARE - Administrator, Civil Service (Medical): No    Lack of Transportation (Non-Medical): No  Physical Activity: Unknown (05/23/2022)   Exercise Vital Sign    Days of Exercise per Week: 0 days    Minutes of Exercise per Session: Not on file  Stress: No Stress Concern Present (05/23/2022)   [employer] of Occupational Health - Occupational Stress Questionnaire    Feeling of Stress : Not at all  Social Connections: Unknown (05/23/2022)   Social Connection and Isolation Panel [NHANES]    Frequency of Communication with Friends and Family: More than three times a week    Frequency of Social Gatherings with Friends and Family: More than three times a week    Attends Religious Services: Not on file    Active Member of Clubs or Organizations: Not on file    Attends Banker Meetings: Not on file    Marital Status: Not on file     Review of Systems     Objective:     LMP 02/21/1984  Wt Readings from Last 3 Encounters:  12/14/22 173 lb 3.2 oz (78.6 kg)  10/07/22 176 lb 3.2 oz (79.9 kg)  06/16/22 176 lb 3.2 oz (79.9 kg)    Physical Exam   Outpatient Encounter Medications as of 01/23/2023  Medication Sig   acetaminophen (TYLENOL) 325 MG tablet Take 650 mg by mouth every 4 (four) hours as needed.   Alpha-Lipoic Acid 200 MG CAPS Take 200 mg by mouth daily.   Bioflavonoid Products (BIOFLEX PO) Take 1 tablet by mouth daily.    Continuous Blood Gluc Receiver (FREESTYLE LIBRE 2 READER) DEVI 1 application. by Does not apply route every 14 (fourteen) days.   Continuous Glucose Sensor (FREESTYLE LIBRE 2 SENSOR) MISC APPLY 1 EVERY 14 (FOURTEEN) DAYS.   dapagliflozin propanediol (FARXIGA) 5 MG TABS tablet Take 1 tablet (5 mg total) by mouth daily before breakfast.   fish oil-omega-3 fatty acids 1000 MG capsule Take 1 capsule (1 g total) by mouth daily.   Multiple Vitamin (MULTIVITAMIN) tablet  Take 1 tablet by mouth daily.   olmesartan (BENICAR) 40 MG tablet TAKE 1 TABLET BY MOUTH EVERY DAY   pantoprazole (PROTONIX) 40 MG tablet Take 1 tablet (40 mg total) by mouth daily.   Facility-Administered Encounter Medications as of 01/23/2023  Medication   cyanocobalamin ((VITAMIN B-12)) injection 1,000 mcg     Lab Results  Component Value Date   WBC 10.0 03/08/2022   HGB 12.7 03/08/2022   HCT 38.7 03/08/2022   PLT 282.0 03/08/2022   GLUCOSE 230 (H) 01/11/2023   CHOL 219 (H) 01/11/2023   TRIG 224.0 (H) 01/11/2023   HDL 38.80 (L) 01/11/2023   LDLDIRECT 130.0 07/15/2021   LDLCALC 135 (H) 01/11/2023   ALT 19 01/11/2023   AST 13 01/11/2023   NA 135 01/11/2023   K 5.1 01/11/2023   CL 98 01/11/2023   CREATININE 1.07 01/11/2023   BUN 26 (H) 01/11/2023   CO2 29 01/11/2023   TSH 2.54 01/11/2023   HGBA1C 8.7 (H) 01/11/2023   MICROALBUR 2.9 (H) 01/13/2023    No results found.     Assessment & Plan:  There are no diagnoses linked to this encounter.   Dale [place], MD

## 2023-01-23 ENCOUNTER — Ambulatory Visit (INDEPENDENT_AMBULATORY_CARE_PROVIDER_SITE_OTHER): Payer: PPO | Admitting: Internal Medicine

## 2023-01-23 ENCOUNTER — Telehealth: Payer: Self-pay

## 2023-01-23 ENCOUNTER — Encounter: Payer: Self-pay | Admitting: Internal Medicine

## 2023-01-23 VITALS — BP 136/76 | HR 86 | Temp 98.2°F | Resp 16 | Ht 63.0 in | Wt 179.0 lb

## 2023-01-23 DIAGNOSIS — E1165 Type 2 diabetes mellitus with hyperglycemia: Secondary | ICD-10-CM

## 2023-01-23 DIAGNOSIS — N1831 Chronic kidney disease, stage 3a: Secondary | ICD-10-CM | POA: Diagnosis not present

## 2023-01-23 DIAGNOSIS — E7800 Pure hypercholesterolemia, unspecified: Secondary | ICD-10-CM | POA: Diagnosis not present

## 2023-01-23 DIAGNOSIS — Z7984 Long term (current) use of oral hypoglycemic drugs: Secondary | ICD-10-CM

## 2023-01-23 DIAGNOSIS — I10 Essential (primary) hypertension: Secondary | ICD-10-CM

## 2023-01-23 MED ORDER — DAPAGLIFLOZIN PROPANEDIOL 10 MG PO TABS: 10.0000 mg | ORAL_TABLET | Freq: Every day | ORAL | Status: DC

## 2023-01-23 NOTE — Assessment & Plan Note (Signed)
Sugars reviewed as outlined. Discussed low carb diet and exercise.  Off metformin. Intolerance. Continues on farxiga. Increase farxiga to 10mg  q day. Stay hydrated. Follow met b and A1c.

## 2023-01-23 NOTE — Assessment & Plan Note (Signed)
Benicar 40mg  q day and on farxiga.  Increase farxiga to 10mg  q day. Follow pressures.  Follow metabolic panel.

## 2023-01-23 NOTE — Telephone Encounter (Signed)
PAP: Application for Cathy Vazquez has been submitted to PAP Companies: [employer], via fax  PLEASE BE ADVISED THIS FOR [place] WAS CONDITIONALL APPROVAL THAT I SUBMITTED TO COMPANY ON PT BEHALF JUST THE PT APPLICATION WAS NEEDED.

## 2023-01-23 NOTE — Assessment & Plan Note (Signed)
Avoid antiinflammatories.  Continue benicar 40mg  q day.  On farxiga. Increase to 10mg  q day.  Follow pressures.  Follow metabolic panel. Stay hydrated.

## 2023-01-23 NOTE — Assessment & Plan Note (Signed)
[facility] Lipid diet.  Have discussed calculated cholesterol risk and need for cholesterol medication.  Has declined.  Follow lipid panel.

## 2023-02-01 DIAGNOSIS — M1711 Unilateral primary osteoarthritis, right knee: Secondary | ICD-10-CM | POA: Diagnosis not present

## 2023-02-01 DIAGNOSIS — M1712 Unilateral primary osteoarthritis, left knee: Secondary | ICD-10-CM | POA: Diagnosis not present

## 2023-02-01 DIAGNOSIS — E1165 Type 2 diabetes mellitus with hyperglycemia: Secondary | ICD-10-CM | POA: Diagnosis not present

## 2023-02-09 DIAGNOSIS — Z961 Presence of intraocular lens: Secondary | ICD-10-CM | POA: Diagnosis not present

## 2023-02-09 DIAGNOSIS — E119 Type 2 diabetes mellitus without complications: Secondary | ICD-10-CM | POA: Diagnosis not present

## 2023-02-09 DIAGNOSIS — H40003 Preglaucoma, unspecified, bilateral: Secondary | ICD-10-CM | POA: Diagnosis not present

## 2023-02-09 LAB — HM DIABETES EYE EXAM

## 2023-02-25 ENCOUNTER — Other Ambulatory Visit: Payer: Self-pay | Admitting: Internal Medicine

## 2023-02-28 MED ORDER — DAPAGLIFLOZIN PROPANEDIOL 10 MG PO TABS: 10.0000 mg | ORAL_TABLET | Freq: Every day | ORAL | 3 refills | Status: DC

## 2023-02-28 NOTE — Telephone Encounter (Signed)
 Rx sent in for farxiga #90 with three refills to [facility]

## 2023-02-28 NOTE — Addendum Note (Signed)
 Addended by: Charm Barges on: 02/28/2023 10:04 PM   Modules accepted: Orders

## 2023-02-28 NOTE — Telephone Encounter (Signed)
 PLEASE BE ADVISED THAT I HAVE SUBMITTED APPLICATION FOR FARXIGA  TO [employer] AND PT NEED A SCRIPT SENT TO COMPANY [facility] TO COMPLETE APPLICATION PROCESS I HAVE ADDED [facility] TO PT PREFERRED PHARMACY LIST IN CHART.    Nat FREDRIK [facility] [phone]

## 2023-03-01 NOTE — Telephone Encounter (Signed)
 Rx has been sent by Dr Lorin Picket

## 2023-03-16 NOTE — Telephone Encounter (Signed)
  SHIPMENT TO PT HOME 03/15/2023

## 2023-03-20 ENCOUNTER — Other Ambulatory Visit: Payer: Self-pay | Admitting: Internal Medicine

## 2023-03-20 DIAGNOSIS — E1165 Type 2 diabetes mellitus with hyperglycemia: Secondary | ICD-10-CM

## 2023-03-21 ENCOUNTER — Other Ambulatory Visit: Payer: Self-pay | Admitting: Internal Medicine

## 2023-03-23 ENCOUNTER — Ambulatory Visit: Payer: PPO | Admitting: Internal Medicine

## 2023-03-23 ENCOUNTER — Encounter: Payer: Self-pay | Admitting: Internal Medicine

## 2023-03-23 VITALS — BP 178/90 | HR 88 | Temp 97.8°F | Resp 16 | Ht 63.0 in | Wt 176.0 lb

## 2023-03-23 DIAGNOSIS — Z794 Long term (current) use of insulin: Secondary | ICD-10-CM | POA: Diagnosis not present

## 2023-03-23 DIAGNOSIS — I10 Essential (primary) hypertension: Secondary | ICD-10-CM

## 2023-03-23 DIAGNOSIS — D649 Anemia, unspecified: Secondary | ICD-10-CM

## 2023-03-23 DIAGNOSIS — E1165 Type 2 diabetes mellitus with hyperglycemia: Secondary | ICD-10-CM

## 2023-03-23 DIAGNOSIS — N1831 Chronic kidney disease, stage 3a: Secondary | ICD-10-CM | POA: Diagnosis not present

## 2023-03-23 DIAGNOSIS — E7800 Pure hypercholesterolemia, unspecified: Secondary | ICD-10-CM

## 2023-03-23 NOTE — Progress Notes (Signed)
Subjective:    Patient ID: Cathy Vazquez, female    DOB: 08/25/1940, 83 y.o.   MRN: [id number]  Patient here for  Chief Complaint  Patient presents with   Medical Management of Chronic Issues    HPI Here for a scheduled follow up - follow up regarding diabetes and hypertension. Farxiga increased to 10mg  q day last visit.  She is accompanied by her daughter.  History obtained from both of them. Not watching her diet. Sugars elevated. Reviewed CGM recordings from 03/10/23 - 03/23/23 - target range 4%, high 60% and very high 36%. Discussed the need to get better control of sugars. She plans to get more serious about her diet. Discussed given level of sugars, restarting tresiba. She is agreeable to restart. No chest pain or sob reported. Request referral to nutritionist. No abdominal pain or bowel change reported. Reports blood pressures at home 130s/80s.    Past Medical History:  Diagnosis Date   Anemia    Diverticulosis    GERD (gastroesophageal reflux disease)    Glaucoma    Hypercholesterolemia    Hypertension    Thyroid nodule    Past Surgical History:  Procedure Laterality Date   ABDOMINAL HYSTERECTOMY  1987   fibroids, ovaries not removed   TONSILLECTOMY     Family History  Problem Relation Age of Onset   Hypertension Mother    Hypercholesterolemia Mother    Rheumatic fever Mother    Diabetes Father    Hypertension Father    Hypercholesterolemia Father    Heart disease Father        CABG   Diabetes Sister    Hypertension Brother    Breast cancer Maternal Aunt    Breast cancer Cousin    Colon cancer Neg Hx    Social History   Socioeconomic History   Marital status: Divorced    Spouse name: Not on file   Number of children: 2   Years of education: Not on file   Highest education level: Not on file  Occupational History   Not on file  Tobacco Use   Smoking status: Never   Smokeless tobacco: Never  Vaping Use   Vaping status: Never Used  Substance and  Sexual Activity   Alcohol use: No    Alcohol/week: 0.0 standard drinks of alcohol   Drug use: No   Sexual activity: Not Currently  Other Topics Concern   Not on file  Social History Narrative   Not on file   Social Drivers of Health   Financial Resource Strain: Low Risk  (05/23/2022)   Overall Financial Resource Strain (CARDIA)    Difficulty of Paying Living Expenses: Not hard at all  Food Insecurity: No Food Insecurity (05/23/2022)   Hunger Vital Sign    Worried About Running Out of Food in the Last Year: Never true    Ran Out of Food in the Last Year: Never true  Transportation Needs: No Transportation Needs (05/23/2022)   PRAPARE - Administrator, Civil Service (Medical): No    Lack of Transportation (Non-Medical): No  Physical Activity: Unknown (05/23/2022)   Exercise Vital Sign    Days of Exercise per Week: 0 days    Minutes of Exercise per Session: Not on file  Stress: No Stress Concern Present (05/23/2022)   [employer] of Occupational Health - Occupational Stress Questionnaire    Feeling of Stress : Not at all  Social Connections: Unknown (05/23/2022)   Social Connection and Isolation  Panel [NHANES]    Frequency of Communication with Friends and Family: More than three times a week    Frequency of Social Gatherings with Friends and Family: More than three times a week    Attends Religious Services: Not on Marketing executive or Organizations: Not on file    Attends Banker Meetings: Not on file    Marital Status: Not on file     Review of Systems  Constitutional:  Negative for appetite change and unexpected weight change.  HENT:  Negative for congestion and sinus pressure.   Respiratory:  Negative for cough, chest tightness and shortness of breath.   Cardiovascular:  Negative for chest pain and palpitations.  Gastrointestinal:  Negative for abdominal pain, diarrhea, nausea and vomiting.  Genitourinary:  Negative for difficulty  urinating and dysuria.  Musculoskeletal:  Negative for joint swelling and myalgias.  Skin:  Negative for color change and rash.  Neurological:  Negative for dizziness and headaches.  Psychiatric/Behavioral:  Negative for agitation and dysphoric mood.        Objective:     BP (!) 178/90   Pulse 88   Temp 97.8 F (36.6 C)   Resp 16   Ht 5\' 3"  (1.6 m)   Wt 176 lb (79.8 kg)   LMP 02/21/1984   SpO2 98%   BMI 31.18 kg/m  Wt Readings from Last 3 Encounters:  03/23/23 176 lb (79.8 kg)  01/23/23 179 lb (81.2 kg)  12/14/22 173 lb 3.2 oz (78.6 kg)    Physical Exam Vitals reviewed.  Constitutional:      General: She is not in acute distress.    Appearance: Normal appearance.  HENT:     Head: Normocephalic and atraumatic.     Right Ear: External ear normal.     Left Ear: External ear normal.     Mouth/Throat:     Pharynx: No oropharyngeal exudate or posterior oropharyngeal erythema.  Eyes:     General: No scleral icterus.       Right eye: No discharge.        Left eye: No discharge.     Conjunctiva/sclera: Conjunctivae normal.  Neck:     Thyroid: No thyromegaly.  Cardiovascular:     Rate and Rhythm: Normal rate and regular rhythm.  Pulmonary:     Effort: No respiratory distress.     Breath sounds: Normal breath sounds. No wheezing.  Abdominal:     General: Bowel sounds are normal.     Palpations: Abdomen is soft.     Tenderness: There is no abdominal tenderness.  Musculoskeletal:        General: No swelling or tenderness.     Cervical back: Neck supple. No tenderness.  Lymphadenopathy:     Cervical: No cervical adenopathy.  Skin:    Findings: No erythema or rash.  Neurological:     Mental Status: She is alert.  Psychiatric:        Mood and Affect: Mood normal.        Behavior: Behavior normal.         Outpatient Encounter Medications as of 03/23/2023  Medication Sig   Insulin Degludec (TRESIBA) 100 UNIT/ML SOLN Inject 10 Units into the skin daily.    acetaminophen (TYLENOL) 325 MG tablet Take 650 mg by mouth every 4 (four) hours as needed.   Alpha-Lipoic Acid 200 MG CAPS Take 200 mg by mouth daily.   Bioflavonoid Products (BIOFLEX PO) Take 1 tablet  by mouth daily.    Continuous Blood Gluc Receiver (FREESTYLE LIBRE 2 READER) DEVI 1 application. by Does not apply route every 14 (fourteen) days.   Continuous Glucose Sensor (FREESTYLE LIBRE 2 SENSOR) MISC APPLY 1 EVERY 14 (FOURTEEN) DAYS.   dapagliflozin propanediol (FARXIGA) 10 MG TABS tablet Take 1 tablet (10 mg total) by mouth daily before breakfast.   fish oil-omega-3 fatty acids 1000 MG capsule Take 1 capsule (1 g total) by mouth daily.   Multiple Vitamin (MULTIVITAMIN) tablet Take 1 tablet by mouth daily.   olmesartan (BENICAR) 40 MG tablet TAKE 1 TABLET BY MOUTH EVERY DAY   pantoprazole (PROTONIX) 40 MG tablet TAKE 1 TABLET BY MOUTH EVERY DAY   Facility-Administered Encounter Medications as of 03/23/2023  Medication   cyanocobalamin ((VITAMIN B-12)) injection 1,000 mcg     Lab Results  Component Value Date   WBC 10.0 03/08/2022   HGB 12.7 03/08/2022   HCT 38.7 03/08/2022   PLT 282.0 03/08/2022   GLUCOSE 230 (H) 01/11/2023   CHOL 219 (H) 01/11/2023   TRIG 224.0 (H) 01/11/2023   HDL 38.80 (L) 01/11/2023   LDLDIRECT 130.0 07/15/2021   LDLCALC 135 (H) 01/11/2023   ALT 19 01/11/2023   AST 13 01/11/2023   NA 135 01/11/2023   K 5.1 01/11/2023   CL 98 01/11/2023   CREATININE 1.07 01/11/2023   BUN 26 (H) 01/11/2023   CO2 29 01/11/2023   TSH 2.54 01/11/2023   HGBA1C 8.7 (H) 01/11/2023   MICROALBUR 2.9 (H) 01/13/2023       Assessment & Plan:  Type 2 diabetes mellitus with hyperglycemia, without long-term current use of insulin (HCC) Assessment & Plan: Sugars reviewed as outlined. Discussed low carb diet and exercise.  Off metformin. Intolerance. Continues on farxiga -10mg  q day. Stay hydrated. Follow met b and A1c. Plans to get more serious about her diet. Start tresiba 10  units q day. Follow sugars. Schedule f/u soon to reassess.   Orders: -     Hemoglobin A1c; Future -     AMB Referral [facility] Care Management  Hypercholesteremia Assessment & Plan: [facility] Lipid diet.  Have discussed calculated cholesterol risk and need for cholesterol medication.  Has declined.  Follow lipid panel.   Orders: -     Lipid panel; Future -     Hepatic function panel; Future  Primary hypertension Assessment & Plan: Benicar 40mg  q day and on farxiga.  On farxiga 10mg  q day. Follow pressures.  Follow metabolic panel.    Orders: -     Basic metabolic panel; Future  Anemia, unspecified type Assessment & Plan: Follow cbc.   Orders: -     CBC with Differential/Platelet; Future  Stage 3a chronic kidney disease (HCC) Assessment & Plan: Avoid antiinflammatories.  Continue benicar 40mg  q day.  On farxiga. Increase to 10mg  q day.  Follow pressures.  Follow metabolic panel. Stay hydrated.       Dale [place], MD

## 2023-03-24 ENCOUNTER — Telehealth: Payer: Self-pay

## 2023-03-24 NOTE — Progress Notes (Signed)
Care Guide Pharmacy Note  03/24/2023 Name: Cathy Vazquez MRN: [id number] DOB: [birth year]  Referred By: Dale Cary, MD Reason for referral: Care Coordination (Outreach to schedule with Pharm d )   Cathy Vazquez is a 83 y.o. year old female who is a primary care patient of Dale [place], MD.  Cathy Vazquez was referred to the pharmacist for assistance related to: DMII  Successful contact was made with the patient to discuss pharmacy services including being ready for the pharmacist to call at least 5 minutes before the scheduled appointment time and to have medication bottles and any blood pressure readings ready for review. The patient agreed to meet with the pharmacist via telephone visit on (date/time).03/28/2023  Penne Lash , RMA     [place]  [facility], [facility] Guide  Direct Dial: [phone]  Website: [email or web address]

## 2023-03-26 ENCOUNTER — Encounter: Payer: Self-pay | Admitting: Internal Medicine

## 2023-03-26 NOTE — Assessment & Plan Note (Signed)
Benicar 40mg  q day and on farxiga.  On farxiga 10mg  q day. Follow pressures.  Follow metabolic panel.

## 2023-03-26 NOTE — Assessment & Plan Note (Signed)
 Follow cbc.

## 2023-03-26 NOTE — Assessment & Plan Note (Signed)
 Avoid antiinflammatories.  Continue benicar 40mg  q day.  On farxiga. Increase to 10mg  q day.  Follow pressures.  Follow metabolic panel. Stay hydrated.

## 2023-03-26 NOTE — Assessment & Plan Note (Signed)
Sugars reviewed as outlined. Discussed low carb diet and exercise.  Off metformin. Intolerance. Continues on farxiga -10mg  q day. Stay hydrated. Follow met b and A1c. Plans to get more serious about her diet. Start tresiba 10 units q day. Follow sugars. Schedule f/u soon to reassess.

## 2023-03-26 NOTE — Assessment & Plan Note (Signed)
 [facility] Lipid diet.  Have discussed calculated cholesterol risk and need for cholesterol medication.  Has declined.  Follow lipid panel.

## 2023-03-28 ENCOUNTER — Other Ambulatory Visit: Payer: PPO | Admitting: Pharmacist

## 2023-03-28 DIAGNOSIS — E1165 Type 2 diabetes mellitus with hyperglycemia: Secondary | ICD-10-CM

## 2023-03-28 NOTE — Progress Notes (Signed)
 Manufacturer Assistance Program (MAP) Application  Manufacturer: Novo Nordisk    (Re-enrollment) Medication(s): Tresiba  U100, NovoFine needle  Patient Portion of Application:  03/28/23: Completed with patient via online enrollment tool. Submitted.  Income Documentation: N/A - Electronic verification elected.  Provider Portion of Application:  03/28/23: Completed with provider consent via online re-enrollment portal. Forwarded to PCP.  Prescription(s): Included in MAP application. Tresiba  U100 Pens: 1 box (15mL) - 10 units daily  Novofine needle: 1 box (100 ea)  Application Status: Submitted via online enrollment (pending approval)  Next Steps: []    Program approval/denial received via fax and documented in chart   Forwarded to [facility] CPhT Patient Advocate Team for future correspondences/re-enrollment.   *Patient has been using an old (opened) Tresiba  pen. Has kept stored in fridge, though it was opened months ago. Advised her to discard. Confirms has several unopened pens in the fridge. Expiration date on box in date. Has several month supply.

## 2023-04-20 ENCOUNTER — Other Ambulatory Visit: Payer: PPO

## 2023-05-18 ENCOUNTER — Other Ambulatory Visit: Payer: PPO

## 2023-05-24 ENCOUNTER — Other Ambulatory Visit: Payer: Self-pay | Admitting: Internal Medicine

## 2023-05-24 ENCOUNTER — Ambulatory Visit (INDEPENDENT_AMBULATORY_CARE_PROVIDER_SITE_OTHER): Payer: PPO | Admitting: *Deleted

## 2023-05-24 VITALS — Ht 63.0 in | Wt 170.0 lb

## 2023-05-24 DIAGNOSIS — Z780 Asymptomatic menopausal state: Secondary | ICD-10-CM

## 2023-05-24 DIAGNOSIS — Z0000 Encounter for general adult medical examination without abnormal findings: Secondary | ICD-10-CM | POA: Diagnosis not present

## 2023-05-24 DIAGNOSIS — Z1231 Encounter for screening mammogram for malignant neoplasm of breast: Secondary | ICD-10-CM

## 2023-05-24 NOTE — Progress Notes (Addendum)
 Subjective:   Cathy Vazquez is a 83 y.o. who presents for a Medicare Wellness preventive visit.  Visit Complete: Virtual I connected with  [name] on 05/24/23 by a audio enabled telemedicine application and verified that I am speaking with the correct person using two identifiers.  Patient Location: Home  Provider Location: Office/Clinic  I discussed the limitations of evaluation and management by telemedicine. The patient expressed understanding and agreed to proceed.  Vital Signs: Because this visit was a virtual/telehealth visit, some criteria may be missing or patient reported. Any vitals not documented were not able to be obtained and vitals that have been documented are patient reported.  VideoDeclined- This patient declined Librarian, academic. Therefore the visit was completed with audio only.  Persons Participating in Visit: Patient.  AWV Questionnaire: No: Patient Medicare AWV questionnaire was not completed prior to this visit.  Cardiac Risk Factors include: advanced age (>45men, >8 women);obesity (BMI >30kg/m2);diabetes mellitus;dyslipidemia;hypertension     Objective:    Today's Vitals   05/24/23 1014  Weight: 170 lb (77.1 kg)  Height: 5\' 3"  (1.6 m)   Body mass index is 30.11 kg/m.     05/24/2023   10:25 AM 05/23/2022   10:08 AM 09/06/2021    1:33 PM 05/18/2021   12:47 PM 05/11/2020   12:37 PM 05/08/2019    1:13 PM 05/19/2018   10:17 AM  Advanced Directives  Does Patient Have a Medical Advance Directive? Yes Yes Yes Yes Yes Yes   Type of Estate agent of [place];Living will Healthcare Power of [place];Living will Healthcare Power of [place];Living will Healthcare Power of [place];Living will Healthcare Power of [place];Living will Healthcare Power of [place];Living will   Does patient want to make changes to medical advance directive?  No - Patient declined  No - Patient declined No - Patient  declined No - Patient declined No - Patient declined  Copy of Healthcare Power of Attorney in Chart? No - copy requested Yes - validated most recent copy scanned in chart (See row information)  Yes - validated most recent copy scanned in chart (See row information) Yes - validated most recent copy scanned in chart (See row information) Yes - validated most recent copy scanned in chart (See row information) Yes - validated most recent copy scanned in chart (See row information)    Current Medications (verified) Outpatient Encounter Medications as of 05/24/2023  Medication Sig   acetaminophen (TYLENOL) 325 MG tablet Take 650 mg by mouth every 4 (four) hours as needed.   Alpha-Lipoic Acid 200 MG CAPS Take 200 mg by mouth daily.   Bioflavonoid Products (BIOFLEX PO) Take 1 tablet by mouth daily.    Continuous Blood Gluc Receiver (FREESTYLE LIBRE 2 READER) DEVI 1 application. by Does not apply route every 14 (fourteen) days.   Continuous Glucose Sensor (FREESTYLE LIBRE 2 SENSOR) MISC APPLY 1 EVERY 14 (FOURTEEN) DAYS.   dapagliflozin propanediol (FARXIGA) 10 MG TABS tablet Take 1 tablet (10 mg total) by mouth daily before breakfast.   fish oil-omega-3 fatty acids 1000 MG capsule Take 1 capsule (1 g total) by mouth daily.   Insulin Degludec (TRESIBA) 100 UNIT/ML SOLN Inject 10 Units into the skin daily.   Multiple Vitamin (MULTIVITAMIN) tablet Take 1 tablet by mouth daily.   olmesartan (BENICAR) 40 MG tablet TAKE 1 TABLET BY MOUTH EVERY DAY   pantoprazole (PROTONIX) 40 MG tablet TAKE 1 TABLET BY MOUTH EVERY DAY   Facility-Administered Encounter Medications as  of 05/24/2023  Medication   cyanocobalamin ((VITAMIN B-12)) injection 1,000 mcg    Allergies (verified) Norvasc [amlodipine besylate], Contrast media [iodinated contrast media], Penicillins, Codeine, Diclofenac, Felodipine, Lipitor [atorvastatin], Stay awake [caffeine], and Zocor [simvastatin]   History: Past Medical History:  Diagnosis Date    Anemia    Diverticulosis    GERD (gastroesophageal reflux disease)    Glaucoma    Hypercholesterolemia    Hypertension    Thyroid nodule    Past Surgical History:  Procedure Laterality Date   ABDOMINAL HYSTERECTOMY  1987   fibroids, ovaries not removed   TONSILLECTOMY     Family History  Problem Relation Age of Onset   Hypertension Mother    Hypercholesterolemia Mother    Rheumatic fever Mother    Diabetes Father    Hypertension Father    Hypercholesterolemia Father    Heart disease Father        CABG   Diabetes Sister    Hypertension Brother    Breast cancer Maternal Aunt    Breast cancer Cousin    Colon cancer Neg Hx    Social History   Socioeconomic History   Marital status: Divorced    Spouse name: Not on file   Number of children: 2   Years of education: Not on file   Highest education level: Not on file  Occupational History   Not on file  Tobacco Use   Smoking status: Never   Smokeless tobacco: Never  Vaping Use   Vaping status: Never Used  Substance and Sexual Activity   Alcohol use: No    Alcohol/week: 0.0 standard drinks of alcohol   Drug use: No   Sexual activity: Not Currently  Other Topics Concern   Not on file  Social History Narrative   Not on file   Social Drivers of Health   Financial Resource Strain: Low Risk  (05/24/2023)   Overall Financial Resource Strain (CARDIA)    Difficulty of Paying Living Expenses: Not hard at all  Food Insecurity: No Food Insecurity (05/24/2023)   Hunger Vital Sign    Worried About Running Out of Food in the Last Year: Never true    Ran Out of Food in the Last Year: Never true  Transportation Needs: No Transportation Needs (05/24/2023)   PRAPARE - Administrator, Civil Service (Medical): No    Lack of Transportation (Non-Medical): No  Physical Activity: Inactive (05/24/2023)   Exercise Vital Sign    Days of Exercise per Week: 0 days    Minutes of Exercise per Session: 0 min  Stress: No Stress  Concern Present (05/24/2023)   [employer] of Occupational Health - Occupational Stress Questionnaire    Feeling of Stress : Not at all  Social Connections: Socially Isolated (05/24/2023)   Social Connection and Isolation Panel [NHANES]    Frequency of Communication with Friends and Family: More than three times a week    Frequency of Social Gatherings with Friends and Family: More than three times a week    Attends Religious Services: Never    Database administrator or Organizations: No    Attends Engineer, structural: Never    Marital Status: Divorced    Tobacco Counseling Counseling given: Not Answered    Clinical Intake:  Pre-visit preparation completed: Yes  Pain : No/denies pain     BMI - recorded: 30.11 Nutritional Status: BMI > 30  Obese Nutritional Risks: None Diabetes: Yes CBG done?: No Did pt. bring  in CBG monitor from home?: No  Lab Results  Component Value Date   HGBA1C 8.7 (H) 01/11/2023   HGBA1C 7.6 (H) 09/14/2022   HGBA1C 7.6 (H) 06/13/2022     How often do you need to have someone help you when you read instructions, pamphlets, or other written materials from your doctor or pharmacy?: 1 - Never  Interpreter Needed?: No  Information entered by :: R. Pierrette Scheu LPN   Activities of Daily Living     05/24/2023   10:16 AM  In your present state of health, do you have any difficulty performing the following activities:  Hearing? 0  Vision? 0  Difficulty concentrating or making decisions? 0  Walking or climbing stairs? 1  Dressing or bathing? 0  Doing errands, shopping? 1  Preparing Food and eating ? N  Using the Toilet? N  In the past six months, have you accidently leaked urine? N  Do you have problems with loss of bowel control? N  Managing your Medications? N  Managing your Finances? N  Housekeeping or managing your Housekeeping? N    Patient Care Team: Dale [place], MD as PCP - General (Internal Medicine) Loree Fee, [facility]  (Pharmacist)  Indicate any recent Medical Services you may have received from other than [facility] providers in the past year (date may be approximate).     Assessment:   This is a routine wellness examination for Cathy Vazquez.  Hearing/Vision screen Hearing Screening - Comments:: No issues Vision Screening - Comments:: glasses   Goals Addressed             This Visit's Progress    Patient Stated       Wants to start exercising       Depression Screen     05/24/2023   10:20 AM 05/23/2022   10:08 AM 01/18/2022   11:42 AM 01/18/2022   11:38 AM 11/05/2021    2:43 PM 09/17/2021    4:12 PM 06/03/2021   11:24 AM  PHQ 2/9 Scores  PHQ - 2 Score 0 0 0 0 0 0 1  PHQ- 9 Score 3          Fall Risk     05/24/2023   10:18 AM 05/23/2022   10:09 AM 01/18/2022   11:42 AM 01/18/2022   11:39 AM 11/05/2021    2:43 PM  Fall Risk   Falls in the past year? 0 0 0 0 0  Number falls in past yr: 0 0 0 0 0  Injury with Fall? 0 0 0 0 0  Risk for fall due to : No Fall Risks Impaired balance/gait No Fall Risks  No Fall Risks  Risk for fall due to: Comment  Cane in use when ambulating     Follow up Falls prevention discussed;Falls evaluation completed Falls evaluation completed;Falls prevention discussed Falls evaluation completed  Falls evaluation completed    MEDICARE RISK AT HOME:  Medicare Risk at Home Any stairs in or around the home?: Yes If so, are there any without handrails?: No Home free of loose throw rugs in walkways, pet beds, electrical cords, etc?: Yes Adequate lighting in your home to reduce risk of falls?: Yes Life alert?: Yes Use of a cane, walker or w/c?: Yes Grab bars in the bathroom?: Yes Shower chair or bench in shower?: Yes Elevated toilet seat or a handicapped toilet?: Yes  TIMED UP AND GO:  Was the test performed?  No  Cognitive Function: 6CIT completed    04/26/2017  3:46 PM 04/25/2016    3:36 PM 02/26/2015    1:36 PM  MMSE - Mini Mental State Exam  Orientation to time 5 5  5   Orientation to Place 5 5 5   Registration 3 3 3   Attention/ Calculation 5 5 5   Recall 3 3 3   Language- name 2 objects 2 2 2   Language- repeat 1 1 1   Language- follow 3 step command 3 3 3   Language- read & follow direction 1 1 1   Write a sentence 1 1 1   Copy design 1 1 1   Total score 30 30 30         05/24/2023   10:26 AM 05/23/2022   10:13 AM 05/11/2020   12:49 PM 05/08/2019    1:28 PM 04/30/2018    3:07 PM  6CIT Screen  What Year? 0 points 0 points 0 points 0 points 0 points  What month? 0 points 0 points 0 points 0 points 0 points  What time? 0 points 0 points 0 points 0 points 0 points  Count back from 20 0 points 0 points 0 points 0 points 0 points  Months in reverse 0 points 0 points 0 points 0 points 0 points  Repeat phrase 0 points 0 points  0 points 0 points  Total Score 0 points 0 points  0 points 0 points    Immunizations Immunization History  Administered Date(s) Administered   Influenza Inj Mdck Quad Pf 05/01/2018   Influenza Split 10/29/2012   Influenza, High Dose Seasonal PF 01/05/2017, 12/13/2018   Pneumococcal Conjugate-13 10/29/2012   Tdap 05/19/2018   Zoster, Live 02/21/2009    Screening Tests Health Maintenance  Topic Date Due   COVID-19 Vaccine (1) Never done   Medicare Annual Wellness (AWV)  05/23/2023   Zoster Vaccines- Shingrix (1 of 2) 06/21/2023 (Originally 08/15/1959)   Pneumonia Vaccine 33+ Years old (2 of 2 - PPSV23 or PCV20) 10/07/2023 (Originally 12/24/2012)   MAMMOGRAM  03/22/2024 (Originally 05/09/2020)   FOOT EXAM  06/16/2023   HEMOGLOBIN A1C  07/11/2023   INFLUENZA VACCINE  09/22/2023   Diabetic kidney evaluation - eGFR measurement  01/11/2024   Diabetic kidney evaluation - Urine ACR  01/13/2024   OPHTHALMOLOGY EXAM  02/09/2024   DTaP/Tdap/Td (2 - Td or Tdap) 05/18/2028   DEXA SCAN  Completed   HPV VACCINES  Aged Out    Health Maintenance  Health Maintenance Due  Topic Date Due   COVID-19 Vaccine (1) Never done   Medicare  Annual Wellness (AWV)  05/23/2023   Health Maintenance Items Addressed: DEXA ordered, Mammogram was ordered 06/16/22. Patient reminded to call and schedule both. Patient declines flu and covid vaccines. Discuss the need to update shingles vaccines.  Additional Screening:  Vision Screening: Recommended annual ophthalmology exams for early detection of glaucoma and other disorders of the eye. Up to date [facility]  Dental Screening: Recommended annual dental exams for proper oral hygiene  Community Resource Referral / Chronic Care Management: CRR required this visit?  No   CCM required this visit?  No     Plan:     I have personally reviewed and noted the following in the patient's chart:   Medical and social history Use of alcohol, tobacco or illicit drugs  Current medications and supplements including opioid prescriptions. Patient is not currently taking opioid prescriptions. Functional ability and status Nutritional status Physical activity Advanced directives List of other physicians Hospitalizations, surgeries, and ER visits in previous 12 months Vitals Screenings to  include cognitive, depression, and falls Referrals and appointments  In addition, I have reviewed and discussed with patient certain preventive protocols, quality metrics, and best practice recommendations. A written personalized care plan for preventive services as well as general preventive health recommendations were provided to patient.     Sydell Axon, LPN   4/0/9811   After Visit Summary: (MyChart) Due to this being a telephonic visit, the after visit summary with patients personalized plan was offered to patient via MyChart   Notes: Nothing significant to report at this time.

## 2023-05-24 NOTE — Patient Instructions (Signed)
 Cathy Vazquez , Thank you for taking time to come for your Medicare Wellness Visit. I appreciate your ongoing commitment to your health goals. Please review the following plan we discussed and let me know if I can assist you in the future.   Referrals/Orders/Follow-Ups/Clinician Recommendations: Order has been placed for your bone density and mammogram. Remember to update your shingles vaccines at the pharmacy. You have an order for:  []   2D Mammogram  [x]   3D Mammogram  [x]   Bone Density     Please call for appointment:  [facility]  [address] [place] [address] [phone]   Make sure to wear two-piece clothing.  No lotions, powders, or deodorants the day of the appointment. Make sure to bring picture ID and insurance card.  Bring list of medications you are currently taking including any supplements.    This is a list of the screening recommended for you and due dates:  Health Maintenance  Topic Date Due   COVID-19 Vaccine (1) Never done   Zoster (Shingles) Vaccine (1 of 2) 06/21/2023*   Pneumonia Vaccine (2 of 2 - PPSV23 or PCV20) 10/07/2023*   Mammogram  03/22/2024*   Complete foot exam   06/16/2023   Hemoglobin A1C  07/11/2023   Flu Shot  09/22/2023   Yearly kidney function blood test for diabetes  01/11/2024   Yearly kidney health urinalysis for diabetes  01/13/2024   Eye exam for diabetics  02/09/2024   Medicare Annual Wellness Visit  05/23/2024   DTaP/Tdap/Td vaccine (2 - Td or Tdap) 05/18/2028   DEXA scan (bone density measurement)  Completed   HPV Vaccine  Aged Out  *Topic was postponed. The date shown is not the original due date.    Advanced directives: (Copy Requested) Please bring a copy of your health care power of attorney and living will to the office to be added to your chart at your convenience. You can mail to [facility] [address] [place], [place] [address] or email to  [email or web address]  Next Medicare Annual Wellness Visit scheduled for next year: Yes 05/29/24 @ 10:50

## 2023-05-25 ENCOUNTER — Other Ambulatory Visit (INDEPENDENT_AMBULATORY_CARE_PROVIDER_SITE_OTHER)

## 2023-05-25 DIAGNOSIS — E1165 Type 2 diabetes mellitus with hyperglycemia: Secondary | ICD-10-CM | POA: Diagnosis not present

## 2023-05-25 DIAGNOSIS — E7800 Pure hypercholesterolemia, unspecified: Secondary | ICD-10-CM

## 2023-05-25 DIAGNOSIS — D649 Anemia, unspecified: Secondary | ICD-10-CM

## 2023-05-25 DIAGNOSIS — I10 Essential (primary) hypertension: Secondary | ICD-10-CM

## 2023-05-25 LAB — CBC WITH DIFFERENTIAL/PLATELET
Basophils Absolute: 0.1 10*3/uL (ref 0.0–0.1)
Basophils Relative: 0.9 % (ref 0.0–3.0)
Eosinophils Absolute: 0.2 10*3/uL (ref 0.0–0.7)
Eosinophils Relative: 2.1 % (ref 0.0–5.0)
HCT: 43.1 % (ref 36.0–46.0)
Hemoglobin: 14.3 g/dL (ref 12.0–15.0)
Lymphocytes Relative: 22.7 % (ref 12.0–46.0)
Lymphs Abs: 2.5 10*3/uL (ref 0.7–4.0)
MCHC: 33.2 g/dL (ref 30.0–36.0)
MCV: 87.7 fl (ref 78.0–100.0)
Monocytes Absolute: 0.7 10*3/uL (ref 0.1–1.0)
Monocytes Relative: 6.2 % (ref 3.0–12.0)
Neutro Abs: 7.5 10*3/uL (ref 1.4–7.7)
Neutrophils Relative %: 68.1 % (ref 43.0–77.0)
Platelets: 260 10*3/uL (ref 150.0–400.0)
RBC: 4.91 Mil/uL (ref 3.87–5.11)
RDW: 16 % — ABNORMAL HIGH (ref 11.5–15.5)
WBC: 11.1 10*3/uL — ABNORMAL HIGH (ref 4.0–10.5)

## 2023-05-25 LAB — LIPID PANEL
Cholesterol: 213 mg/dL — ABNORMAL HIGH (ref 0–200)
HDL: 37 mg/dL — ABNORMAL LOW (ref >39.00)
LDL Cholesterol: 137 mg/dL — ABNORMAL HIGH (ref 0–99)
NonHDL: 175.55
Total CHOL/HDL Ratio: 6
Triglycerides: 194 mg/dL — ABNORMAL HIGH (ref 0.0–149.0)
VLDL: 38.8 mg/dL (ref 0.0–40.0)

## 2023-05-25 LAB — BASIC METABOLIC PANEL WITH GFR
BUN: 22 mg/dL (ref 6–23)
CO2: 29 meq/L (ref 19–32)
Calcium: 9.5 mg/dL (ref 8.4–10.5)
Chloride: 101 meq/L (ref 96–112)
Creatinine, Ser: 0.98 mg/dL (ref 0.40–1.20)
GFR: 52.9 mL/min — ABNORMAL LOW (ref >60.00)
Glucose, Bld: 144 mg/dL — ABNORMAL HIGH (ref 70–99)
Potassium: 4.6 meq/L (ref 3.5–5.1)
Sodium: 137 meq/L (ref 135–145)

## 2023-05-25 LAB — HEPATIC FUNCTION PANEL
ALT: 17 U/L (ref 0–35)
AST: 14 U/L (ref 0–37)
Albumin: 4.5 g/dL (ref 3.5–5.2)
Alkaline Phosphatase: 61 U/L (ref 39–117)
Bilirubin, Direct: 0.1 mg/dL (ref 0.0–0.3)
Total Bilirubin: 0.6 mg/dL (ref 0.2–1.2)
Total Protein: 7 g/dL (ref 6.0–8.3)

## 2023-05-25 LAB — HEMOGLOBIN A1C: Hgb A1c MFr Bld: 8 % — ABNORMAL HIGH (ref 4.6–6.5)

## 2023-06-01 ENCOUNTER — Ambulatory Visit (INDEPENDENT_AMBULATORY_CARE_PROVIDER_SITE_OTHER): Payer: PPO | Admitting: Internal Medicine

## 2023-06-01 VITALS — BP 138/72 | HR 89 | Temp 98.0°F | Resp 17 | Ht 63.0 in | Wt 177.0 lb

## 2023-06-01 DIAGNOSIS — E042 Nontoxic multinodular goiter: Secondary | ICD-10-CM | POA: Diagnosis not present

## 2023-06-01 DIAGNOSIS — Z794 Long term (current) use of insulin: Secondary | ICD-10-CM

## 2023-06-01 DIAGNOSIS — D649 Anemia, unspecified: Secondary | ICD-10-CM | POA: Diagnosis not present

## 2023-06-01 DIAGNOSIS — E1165 Type 2 diabetes mellitus with hyperglycemia: Secondary | ICD-10-CM | POA: Diagnosis not present

## 2023-06-01 DIAGNOSIS — E7800 Pure hypercholesterolemia, unspecified: Secondary | ICD-10-CM | POA: Diagnosis not present

## 2023-06-01 DIAGNOSIS — I10 Essential (primary) hypertension: Secondary | ICD-10-CM

## 2023-06-01 DIAGNOSIS — N1831 Chronic kidney disease, stage 3a: Secondary | ICD-10-CM | POA: Diagnosis not present

## 2023-06-01 DIAGNOSIS — D72829 Elevated white blood cell count, unspecified: Secondary | ICD-10-CM | POA: Diagnosis not present

## 2023-06-01 DIAGNOSIS — R2681 Unsteadiness on feet: Secondary | ICD-10-CM

## 2023-06-01 NOTE — Progress Notes (Signed)
 Subjective:    Patient ID: Cathy Vazquez, female    DOB: 1940-11-23, 83 y.o.   MRN: [id number]  Patient here for  Chief Complaint  Patient presents with   Medical Management of Chronic Issues    2 mth f/u    HPI Here for a scheduled follow up - follow up regarding diabetes and hypertension. Sugars elevated last visit. Restarted tresiba. A1c improved some from recent check - now 8.0. she is accompanied by her daughter. History obtained from both of them. Have been trying to modify her diet and carb intake. Has noticed being a little more unsteady recently - when walking. Using her walker. Discussed strengthening her core muscles and PT working with her regarding her gait instability. No chest pain or sob reported. No abdominal pain reported. Reviewed her CGM readings from 05/19/23 - 06/01/23. Time active 52%. (States sensor not working right for part of the time). Target range - 51%, high 45% and very higy 4%. Improved. Glucose management indicator 7.7. given working on diet and trending improvement, discussed holding on medication changes.   Past Medical History:  Diagnosis Date   Anemia    Diverticulosis    GERD (gastroesophageal reflux disease)    Glaucoma    Hypercholesterolemia    Hypertension    Thyroid nodule    Past Surgical History:  Procedure Laterality Date   ABDOMINAL HYSTERECTOMY  1987   fibroids, ovaries not removed   TONSILLECTOMY     Family History  Problem Relation Age of Onset   Hypertension Mother    Hypercholesterolemia Mother    Rheumatic fever Mother    Diabetes Father    Hypertension Father    Hypercholesterolemia Father    Heart disease Father        CABG   Diabetes Sister    Hypertension Brother    Breast cancer Maternal Aunt    Breast cancer Cousin    Colon cancer Neg Hx    Social History   Socioeconomic History   Marital status: Divorced    Spouse name: Not on file   Number of children: 2   Years of education: Not on file   Highest  education level: Not on file  Occupational History   Not on file  Tobacco Use   Smoking status: Never   Smokeless tobacco: Never  Vaping Use   Vaping status: Never Used  Substance and Sexual Activity   Alcohol use: No    Alcohol/week: 0.0 standard drinks of alcohol   Drug use: No   Sexual activity: Not Currently  Other Topics Concern   Not on file  Social History Narrative   Not on file   Social Drivers of Health   Financial Resource Strain: Low Risk  (05/24/2023)   Overall Financial Resource Strain (CARDIA)    Difficulty of Paying Living Expenses: Not hard at all  Food Insecurity: No Food Insecurity (05/24/2023)   Hunger Vital Sign    Worried About Running Out of Food in the Last Year: Never true    Ran Out of Food in the Last Year: Never true  Transportation Needs: No Transportation Needs (05/24/2023)   PRAPARE - Administrator, Civil Service (Medical): No    Lack of Transportation (Non-Medical): No  Physical Activity: Inactive (05/24/2023)   Exercise Vital Sign    Days of Exercise per Week: 0 days    Minutes of Exercise per Session: 0 min  Stress: No Stress Concern Present (05/24/2023)   [place]  [employer] - Occupational Stress Questionnaire    Feeling of Stress : Not at all  Social Connections: Socially Isolated (05/24/2023)   Social Connection and Isolation Panel [NHANES]    Frequency of Communication with Friends and Family: More than three times a week    Frequency of Social Gatherings with Friends and Family: More than three times a week    Attends Religious Services: Never    Database administrator or Organizations: No    Attends Banker Meetings: Never    Marital Status: Divorced     Review of Systems  Constitutional:  Negative for appetite change and unexpected weight change.  HENT:  Negative for congestion and sinus pressure.   Respiratory:  Negative for cough, chest tightness and shortness of breath.   Cardiovascular:   Negative for chest pain, palpitations and leg swelling.  Gastrointestinal:  Negative for abdominal pain, diarrhea, nausea and vomiting.  Genitourinary:  Negative for difficulty urinating and dysuria.  Musculoskeletal:  Negative for joint swelling and myalgias.  Skin:  Negative for color change and rash.  Neurological:  Negative for dizziness.       Some unsteadiness - gait. Using walker.   Psychiatric/Behavioral:  Negative for agitation and dysphoric mood.        Objective:     BP 138/72   Pulse 89   Temp 98 F (36.7 C) (Oral)   Resp 17   Ht 5\' 3"  (1.6 m)   Wt 177 lb (80.3 kg)   LMP 02/21/1984   SpO2 97%   BMI 31.35 kg/m  Wt Readings from Last 3 Encounters:  06/01/23 177 lb (80.3 kg)  05/24/23 170 lb (77.1 kg)  03/23/23 176 lb (79.8 kg)    Physical Exam Vitals reviewed.  Constitutional:      General: She is not in acute distress.    Appearance: Normal appearance.  HENT:     Head: Normocephalic and atraumatic.     Right Ear: External ear normal.     Left Ear: External ear normal.     Mouth/Throat:     Pharynx: No oropharyngeal exudate or posterior oropharyngeal erythema.  Eyes:     General: No scleral icterus.       Right eye: No discharge.        Left eye: No discharge.     Conjunctiva/sclera: Conjunctivae normal.  Neck:     Thyroid: No thyromegaly.  Cardiovascular:     Rate and Rhythm: Normal rate and regular rhythm.  Pulmonary:     Effort: No respiratory distress.     Breath sounds: Normal breath sounds. No wheezing.  Abdominal:     General: Bowel sounds are normal.     Palpations: Abdomen is soft.     Tenderness: There is no abdominal tenderness.  Musculoskeletal:        General: No swelling or tenderness.     Cervical back: Neck supple. No tenderness.  Lymphadenopathy:     Cervical: No cervical adenopathy.  Skin:    Findings: No erythema or rash.  Neurological:     Mental Status: She is alert.  Psychiatric:        Mood and Affect: Mood normal.         Behavior: Behavior normal.         Outpatient Encounter Medications as of 06/01/2023  Medication Sig   acetaminophen (TYLENOL) 325 MG tablet Take 650 mg by mouth every 4 (four) hours as needed.   Alpha-Lipoic Acid  200 MG CAPS Take 200 mg by mouth daily.   Bioflavonoid Products (BIOFLEX PO) Take 1 tablet by mouth daily.    Continuous Blood Gluc Receiver (FREESTYLE LIBRE 2 READER) DEVI 1 application. by Does not apply route every 14 (fourteen) days.   Continuous Glucose Sensor (FREESTYLE LIBRE 2 SENSOR) MISC APPLY 1 EVERY 14 (FOURTEEN) DAYS.   dapagliflozin propanediol (FARXIGA) 10 MG TABS tablet Take 1 tablet (10 mg total) by mouth daily before breakfast.   fish oil-omega-3 fatty acids 1000 MG capsule Take 1 capsule (1 g total) by mouth daily.   Insulin Degludec (TRESIBA) 100 UNIT/ML SOLN Inject 10 Units into the skin daily.   Multiple Vitamin (MULTIVITAMIN) tablet Take 1 tablet by mouth daily.   olmesartan (BENICAR) 40 MG tablet TAKE 1 TABLET BY MOUTH EVERY DAY   pantoprazole (PROTONIX) 40 MG tablet TAKE 1 TABLET BY MOUTH EVERY DAY   Facility-Administered Encounter Medications as of 06/01/2023  Medication   cyanocobalamin ((VITAMIN B-12)) injection 1,000 mcg     Lab Results  Component Value Date   WBC 11.1 (H) 05/25/2023   HGB 14.3 05/25/2023   HCT 43.1 05/25/2023   PLT 260.0 05/25/2023   GLUCOSE 144 (H) 05/25/2023   CHOL 213 (H) 05/25/2023   TRIG 194.0 (H) 05/25/2023   HDL 37.00 (L) 05/25/2023   LDLDIRECT 130.0 07/15/2021   LDLCALC 137 (H) 05/25/2023   ALT 17 05/25/2023   AST 14 05/25/2023   NA 137 05/25/2023   K 4.6 05/25/2023   CL 101 05/25/2023   CREATININE 0.98 05/25/2023   BUN 22 05/25/2023   CO2 29 05/25/2023   TSH 2.54 01/11/2023   HGBA1C 8.0 (H) 05/25/2023   MICROALBUR 2.9 (H) 01/13/2023       Assessment & Plan:  Type 2 diabetes mellitus with hyperglycemia, without long-term current use of insulin (HCC) Assessment & Plan: Sugars reviewed as  outlined. CGM - data reviewed as outlined. Trending down. A1c 8.0. down from last check. Continue current medication as she is doing. Hold on increasing tresiba. Adjusting diet. Follow met b and A1c.   Orders: -     Hemoglobin A1c; Future  Primary hypertension Assessment & Plan: Benicar 40mg  q day and on farxiga.  On farxiga 10mg  q day. Follow pressures.  Follow metabolic panel. No changes today.   Orders: -     Basic metabolic panel with GFR; Future  Hypercholesteremia Assessment & Plan: [facility] Lipid diet.  Have discussed calculated cholesterol risk and need for cholesterol medication.  Has declined.  Follow lipid panel.   Orders: -     Lipid panel; Future -     Hepatic function panel; Future  Anemia, unspecified type -     CBC with Differential/Platelet; Future  Multiple thyroid nodules Assessment & Plan: Has seen endocrinology previously.  Follow tsh.  Last ultrasound stable.  Follow tsh    Stage 3a chronic kidney disease (HCC) Assessment & Plan: Avoid antiinflammatories.  Continue benicar 40mg  q day.  On farxiga. Increase to 10mg  q day.  Follow pressures.  Follow metabolic panel. Stay hydrated. Recent GFR 52 - improved. Follow.    Leukocytosis, unspecified type Assessment & Plan: White blood cell count slightly elevated. Follow cbc.    Unsteady gait Assessment & Plan: Has noticed being more unsteady lately. Using her walker - helping. Discussed HH PT to help strengthen her core and to help with gait instability.   Orders: -     Ambulatory referral to Home Health  Dellar Fenton, MD

## 2023-06-01 NOTE — Progress Notes (Signed)
 Cathy Vazquez

## 2023-06-04 ENCOUNTER — Encounter: Payer: Self-pay | Admitting: Internal Medicine

## 2023-06-04 DIAGNOSIS — R2681 Unsteadiness on feet: Secondary | ICD-10-CM | POA: Insufficient documentation

## 2023-06-04 NOTE — Assessment & Plan Note (Signed)
 Benicar 40mg  q day and on farxiga.  On farxiga 10mg  q day. Follow pressures.  Follow metabolic panel. No changes today.

## 2023-06-04 NOTE — Assessment & Plan Note (Signed)
 Has noticed being more unsteady lately. Using her walker - helping. Discussed HH PT to help strengthen her core and to help with gait instability.

## 2023-06-04 NOTE — Assessment & Plan Note (Signed)
 Has seen endocrinology previously.  Follow tsh.  Last ultrasound stable.  Follow tsh

## 2023-06-04 NOTE — Assessment & Plan Note (Signed)
 Avoid antiinflammatories.  Continue benicar 40mg  q day.  On farxiga. Increase to 10mg  q day.  Follow pressures.  Follow metabolic panel. Stay hydrated. Recent GFR 52 - improved. Follow.

## 2023-06-04 NOTE — Assessment & Plan Note (Signed)
 [facility] Lipid diet.  Have discussed calculated cholesterol risk and need for cholesterol medication.  Has declined.  Follow lipid panel.

## 2023-06-04 NOTE — Assessment & Plan Note (Signed)
 White blood cell count slightly elevated. Follow cbc.

## 2023-06-04 NOTE — Assessment & Plan Note (Signed)
 Sugars reviewed as outlined. CGM - data reviewed as outlined. Trending down. A1c 8.0. down from last check. Continue current medication as she is doing. Hold on increasing tresiba. Adjusting diet. Follow met b and A1c.

## 2023-06-08 DIAGNOSIS — Z7984 Long term (current) use of oral hypoglycemic drugs: Secondary | ICD-10-CM | POA: Diagnosis not present

## 2023-06-08 DIAGNOSIS — Z794 Long term (current) use of insulin: Secondary | ICD-10-CM | POA: Diagnosis not present

## 2023-06-08 DIAGNOSIS — H409 Unspecified glaucoma: Secondary | ICD-10-CM | POA: Diagnosis not present

## 2023-06-08 DIAGNOSIS — K5790 Diverticulosis of intestine, part unspecified, without perforation or abscess without bleeding: Secondary | ICD-10-CM | POA: Diagnosis not present

## 2023-06-08 DIAGNOSIS — E7800 Pure hypercholesterolemia, unspecified: Secondary | ICD-10-CM | POA: Diagnosis not present

## 2023-06-08 DIAGNOSIS — E042 Nontoxic multinodular goiter: Secondary | ICD-10-CM | POA: Diagnosis not present

## 2023-06-08 DIAGNOSIS — E1122 Type 2 diabetes mellitus with diabetic chronic kidney disease: Secondary | ICD-10-CM | POA: Diagnosis not present

## 2023-06-08 DIAGNOSIS — K219 Gastro-esophageal reflux disease without esophagitis: Secondary | ICD-10-CM | POA: Diagnosis not present

## 2023-06-08 DIAGNOSIS — Z9181 History of falling: Secondary | ICD-10-CM | POA: Diagnosis not present

## 2023-06-08 DIAGNOSIS — D631 Anemia in chronic kidney disease: Secondary | ICD-10-CM | POA: Diagnosis not present

## 2023-06-08 DIAGNOSIS — I129 Hypertensive chronic kidney disease with stage 1 through stage 4 chronic kidney disease, or unspecified chronic kidney disease: Secondary | ICD-10-CM | POA: Diagnosis not present

## 2023-06-08 DIAGNOSIS — N1831 Chronic kidney disease, stage 3a: Secondary | ICD-10-CM | POA: Diagnosis not present

## 2023-06-13 ENCOUNTER — Telehealth: Payer: Self-pay

## 2023-06-13 NOTE — Telephone Encounter (Signed)
 Verbals given to physical therapist.

## 2023-06-13 NOTE — Telephone Encounter (Signed)
 Ok for PT?

## 2023-06-13 NOTE — Telephone Encounter (Signed)
 Copied from CRM [id number]. Topic: Clinical - Home Health Verbal Orders >> Jun 13, 2023  9:13 AM Everlean Hoar wrote: Caller/Agency: Orvan Blanch Number: [phone] Service Requested: Physical Therapy Frequency: 2 times a week for 3 weeks and then once a week for 3 weeks  Any new concerns about the patient? No

## 2023-06-29 ENCOUNTER — Encounter

## 2023-06-29 ENCOUNTER — Other Ambulatory Visit

## 2023-08-02 DIAGNOSIS — E1122 Type 2 diabetes mellitus with diabetic chronic kidney disease: Secondary | ICD-10-CM | POA: Diagnosis not present

## 2023-08-02 DIAGNOSIS — I129 Hypertensive chronic kidney disease with stage 1 through stage 4 chronic kidney disease, or unspecified chronic kidney disease: Secondary | ICD-10-CM | POA: Diagnosis not present

## 2023-08-02 DIAGNOSIS — E042 Nontoxic multinodular goiter: Secondary | ICD-10-CM | POA: Diagnosis not present

## 2023-08-02 DIAGNOSIS — N1831 Chronic kidney disease, stage 3a: Secondary | ICD-10-CM | POA: Diagnosis not present

## 2023-08-02 DIAGNOSIS — K5780 Diverticulitis of intestine, part unspecified, with perforation and abscess without bleeding: Secondary | ICD-10-CM | POA: Diagnosis not present

## 2023-08-02 DIAGNOSIS — Z794 Long term (current) use of insulin: Secondary | ICD-10-CM | POA: Diagnosis not present

## 2023-08-02 DIAGNOSIS — Z7984 Long term (current) use of oral hypoglycemic drugs: Secondary | ICD-10-CM | POA: Diagnosis not present

## 2023-08-02 DIAGNOSIS — Z9181 History of falling: Secondary | ICD-10-CM | POA: Diagnosis not present

## 2023-08-02 DIAGNOSIS — E7800 Pure hypercholesterolemia, unspecified: Secondary | ICD-10-CM | POA: Diagnosis not present

## 2023-08-02 DIAGNOSIS — D631 Anemia in chronic kidney disease: Secondary | ICD-10-CM | POA: Diagnosis not present

## 2023-08-02 DIAGNOSIS — K219 Gastro-esophageal reflux disease without esophagitis: Secondary | ICD-10-CM | POA: Diagnosis not present

## 2023-08-02 DIAGNOSIS — H409 Unspecified glaucoma: Secondary | ICD-10-CM | POA: Diagnosis not present

## 2023-08-11 ENCOUNTER — Telehealth: Payer: Self-pay | Admitting: Internal Medicine

## 2023-08-11 NOTE — Telephone Encounter (Signed)
 LMOM for pt to call back and reschedule appt from 8/21.   E2C2, please reschedule this visit when the pt calls back -[facility]

## 2023-08-17 ENCOUNTER — Ambulatory Visit: Payer: Self-pay | Admitting: Internal Medicine

## 2023-08-17 ENCOUNTER — Ambulatory Visit
Admission: RE | Admit: 2023-08-17 | Discharge: 2023-08-17 | Disposition: A | Discharge status: Home / Self Care | Source: Ambulatory Visit | Attending: Internal Medicine | Admitting: Internal Medicine

## 2023-08-17 DIAGNOSIS — Z1231 Encounter for screening mammogram for malignant neoplasm of breast: Secondary | ICD-10-CM | POA: Diagnosis not present

## 2023-08-17 DIAGNOSIS — Z780 Asymptomatic menopausal state: Secondary | ICD-10-CM | POA: Diagnosis not present

## 2023-08-28 ENCOUNTER — Other Ambulatory Visit: Payer: Self-pay | Admitting: Internal Medicine

## 2023-08-28 DIAGNOSIS — E1165 Type 2 diabetes mellitus with hyperglycemia: Secondary | ICD-10-CM

## 2023-09-06 ENCOUNTER — Telehealth: Payer: Self-pay

## 2023-09-06 NOTE — Telephone Encounter (Signed)
 Called Patient to let her know that we received 1 box of Tresiba  and 2 boxes of pen needles from Patient Assistance.

## 2023-09-07 ENCOUNTER — Other Ambulatory Visit: Payer: Self-pay

## 2023-09-07 ENCOUNTER — Telehealth: Payer: Self-pay

## 2023-09-07 MED ORDER — DAPAGLIFLOZIN PROPANEDIOL 10 MG PO TABS: 10.0000 mg | ORAL_TABLET | Freq: Every day | ORAL | 3 refills | Status: AC

## 2023-09-07 NOTE — Telephone Encounter (Signed)
 Received a request from [employer] Farxiga  to be fax to [facility] [phone].

## 2023-09-15 ENCOUNTER — Telehealth: Payer: Self-pay | Admitting: Internal Medicine

## 2023-09-15 NOTE — Telephone Encounter (Signed)
 Copied from CRM [id number]. Topic: General - Other >> Sep 15, 2023  2:47 PM Lavanda D wrote: Reason for CRM: Apolinar with [facility] calling in regards to an outstanding order. They got the fax sent back to them but it was not signed and dated. It is the home health certification and plan of care 4/17-6/15. It was not signed or dated so it needs to be resent. It is now 3 months out of medicare compliance. It is being refaxed today  Direct fax to [place]: [phone] Callback# [phone]

## 2023-09-18 NOTE — Telephone Encounter (Signed)
 Signed and placed in box.

## 2023-09-18 NOTE — Telephone Encounter (Signed)
 Placed in sign folder signature.

## 2023-09-18 NOTE — Telephone Encounter (Signed)
 faxed

## 2023-10-06 ENCOUNTER — Other Ambulatory Visit (INDEPENDENT_AMBULATORY_CARE_PROVIDER_SITE_OTHER)

## 2023-10-06 ENCOUNTER — Telehealth: Payer: Self-pay

## 2023-10-06 DIAGNOSIS — E1165 Type 2 diabetes mellitus with hyperglycemia: Secondary | ICD-10-CM | POA: Diagnosis not present

## 2023-10-06 DIAGNOSIS — E7800 Pure hypercholesterolemia, unspecified: Secondary | ICD-10-CM

## 2023-10-06 DIAGNOSIS — I10 Essential (primary) hypertension: Secondary | ICD-10-CM

## 2023-10-06 DIAGNOSIS — D649 Anemia, unspecified: Secondary | ICD-10-CM | POA: Diagnosis not present

## 2023-10-06 LAB — HEPATIC FUNCTION PANEL
ALT: 17 U/L (ref 0–35)
AST: 14 U/L (ref 0–37)
Albumin: 4.4 g/dL (ref 3.5–5.2)
Alkaline Phosphatase: 58 U/L (ref 39–117)
Bilirubin, Direct: 0.1 mg/dL (ref 0.0–0.3)
Total Bilirubin: 0.7 mg/dL (ref 0.2–1.2)
Total Protein: 7.1 g/dL (ref 6.0–8.3)

## 2023-10-06 LAB — LIPID PANEL
Cholesterol: 196 mg/dL (ref 0–200)
HDL: 35.3 mg/dL — ABNORMAL LOW (ref >39.00)
LDL Cholesterol: 126 mg/dL — ABNORMAL HIGH (ref 0–99)
NonHDL: 161.19
Total CHOL/HDL Ratio: 6
Triglycerides: 174 mg/dL — ABNORMAL HIGH (ref 0.0–149.0)
VLDL: 34.8 mg/dL (ref 0.0–40.0)

## 2023-10-06 LAB — CBC WITH DIFFERENTIAL/PLATELET
Basophils Absolute: 0.2 K/uL — ABNORMAL HIGH (ref 0.0–0.1)
Basophils Relative: 1.3 % (ref 0.0–3.0)
Eosinophils Absolute: 0.2 K/uL (ref 0.0–0.7)
Eosinophils Relative: 1.8 % (ref 0.0–5.0)
HCT: 43.8 % (ref 36.0–46.0)
Hemoglobin: 14.1 g/dL (ref 12.0–15.0)
Lymphocytes Relative: 23.9 % (ref 12.0–46.0)
Lymphs Abs: 2.9 K/uL (ref 0.7–4.0)
MCHC: 32.3 g/dL (ref 30.0–36.0)
MCV: 86.4 fl (ref 78.0–100.0)
Monocytes Absolute: 0.7 K/uL (ref 0.1–1.0)
Monocytes Relative: 5.7 % (ref 3.0–12.0)
Neutro Abs: 8 K/uL — ABNORMAL HIGH (ref 1.4–7.7)
Neutrophils Relative %: 67.3 % (ref 43.0–77.0)
Platelets: 245 K/uL (ref 150.0–400.0)
RBC: 5.07 Mil/uL (ref 3.87–5.11)
RDW: 16.5 % — ABNORMAL HIGH (ref 11.5–15.5)
WBC: 11.9 K/uL — ABNORMAL HIGH (ref 4.0–10.5)

## 2023-10-06 LAB — HEMOGLOBIN A1C: Hgb A1c MFr Bld: 8.4 % — ABNORMAL HIGH (ref 4.6–6.5)

## 2023-10-06 LAB — BASIC METABOLIC PANEL WITH GFR
BUN: 30 mg/dL — ABNORMAL HIGH (ref 6–23)
CO2: 27 meq/L (ref 19–32)
Calcium: 9.4 mg/dL (ref 8.4–10.5)
Chloride: 102 meq/L (ref 96–112)
Creatinine, Ser: 1.04 mg/dL (ref 0.40–1.20)
GFR: 49.14 mL/min — ABNORMAL LOW (ref >60.00)
Glucose, Bld: 181 mg/dL — ABNORMAL HIGH (ref 70–99)
Potassium: 4.7 meq/L (ref 3.5–5.1)
Sodium: 138 meq/L (ref 135–145)

## 2023-10-06 NOTE — Telephone Encounter (Signed)
 Pt came in and picked up pt assistance medication Tresiba at 10 am on 10/06/23

## 2023-10-09 ENCOUNTER — Ambulatory Visit: Payer: Self-pay | Admitting: Internal Medicine

## 2023-10-12 ENCOUNTER — Encounter: Admitting: Internal Medicine

## 2023-11-01 ENCOUNTER — Other Ambulatory Visit: Payer: Self-pay | Admitting: Internal Medicine

## 2023-11-03 ENCOUNTER — Encounter: Payer: Self-pay | Admitting: Internal Medicine

## 2023-11-03 ENCOUNTER — Ambulatory Visit: Admitting: Internal Medicine

## 2023-11-03 VITALS — BP 128/70 | HR 89 | Resp 16 | Ht 63.0 in | Wt 178.6 lb

## 2023-11-03 DIAGNOSIS — N1831 Chronic kidney disease, stage 3a: Secondary | ICD-10-CM

## 2023-11-03 DIAGNOSIS — E042 Nontoxic multinodular goiter: Secondary | ICD-10-CM | POA: Diagnosis not present

## 2023-11-03 DIAGNOSIS — E7800 Pure hypercholesterolemia, unspecified: Secondary | ICD-10-CM | POA: Diagnosis not present

## 2023-11-03 DIAGNOSIS — Z23 Encounter for immunization: Secondary | ICD-10-CM | POA: Diagnosis not present

## 2023-11-03 DIAGNOSIS — D649 Anemia, unspecified: Secondary | ICD-10-CM

## 2023-11-03 DIAGNOSIS — E1165 Type 2 diabetes mellitus with hyperglycemia: Secondary | ICD-10-CM | POA: Diagnosis not present

## 2023-11-03 DIAGNOSIS — Z0000 Encounter for general adult medical examination without abnormal findings: Secondary | ICD-10-CM

## 2023-11-03 DIAGNOSIS — I10 Essential (primary) hypertension: Secondary | ICD-10-CM

## 2023-11-03 NOTE — Assessment & Plan Note (Signed)
 Physical today 11/03/23.  Colonoscopy 09/2006.  Barium ok.  Had previously discussed f/u colonoscopy/cologuard. She had declined.  Mammogram - 08/17/23 - Birads I.

## 2023-11-03 NOTE — Progress Notes (Unsigned)
 Subjective:    Patient ID: Cathy Vazquez, female    DOB: June 14, 1940, 83 y.o.   MRN: [id number]  Patient here for  Chief Complaint  Patient presents with  . Annual Exam    HPI Here for physical exam. On tresiba . Sugars have been elevated. On farxiga  and tresiba .    Past Medical History:  Diagnosis Date  . Anemia   . Diverticulosis   . GERD (gastroesophageal reflux disease)   . Glaucoma   . Hypercholesterolemia   . Hypertension   . Thyroid  nodule    Past Surgical History:  Procedure Laterality Date  . ABDOMINAL HYSTERECTOMY  1987   fibroids, ovaries not removed  . TONSILLECTOMY     Family History  Problem Relation Age of Onset  . Hypertension Mother   . Hypercholesterolemia Mother   . Rheumatic fever Mother   . Diabetes Father   . Hypertension Father   . Hypercholesterolemia Father   . Heart disease Father        CABG  . Diabetes Sister   . Hypertension Brother   . Breast cancer Maternal Aunt   . Breast cancer Cousin   . Colon cancer Neg Hx    Social History   Socioeconomic History  . Marital status: Divorced    Spouse name: Not on file  . Number of children: 2  . Years of education: Not on file  . Highest education level: Not on file  Occupational History  . Not on file  Tobacco Use  . Smoking status: Never  . Smokeless tobacco: Never  Vaping Use  . Vaping status: Never Used  Substance and Sexual Activity  . Alcohol use: No    Alcohol/week: 0.0 standard drinks of alcohol  . Drug use: No  . Sexual activity: Not Currently  Other Topics Concern  . Not on file  Social History Narrative  . Not on file   Social Drivers of Health   Financial Resource Strain: Low Risk  (05/24/2023)   Overall Financial Resource Strain (CARDIA)   . Difficulty of Paying Living Expenses: Not hard at all  Food Insecurity: No Food Insecurity (05/24/2023)   Hunger Vital Sign   . Worried About Programme researcher, broadcasting/film/video in the Last Year: Never true   . Ran Out of Food in the  Last Year: Never true  Transportation Needs: No Transportation Needs (05/24/2023)   PRAPARE - Transportation   . Lack of Transportation (Medical): No   . Lack of Transportation (Non-Medical): No  Physical Activity: Inactive (05/24/2023)   Exercise Vital Sign   . Days of Exercise per Week: 0 days   . Minutes of Exercise per Session: 0 min  Stress: No Stress Concern Present (05/24/2023)   [employer] of Occupational Health - Occupational Stress Questionnaire   . Feeling of Stress : Not at all  Social Connections: Socially Isolated (05/24/2023)   Social Connection and Isolation Panel   . Frequency of Communication with Friends and Family: More than three times a week   . Frequency of Social Gatherings with Friends and Family: More than three times a week   . Attends Religious Services: Never   . Active Member of Clubs or Organizations: No   . Attends Banker Meetings: Never   . Marital Status: Divorced     Review of Systems     Objective:     LMP 02/21/1984  Wt Readings from Last 3 Encounters:  06/01/23 177 lb (80.3 kg)  05/24/23 170  lb (77.1 kg)  03/23/23 176 lb (79.8 kg)    Physical Exam  {Perform Simple Foot Exam  Perform Detailed exam:1} {Insert foot Exam (Optional):30965}   Outpatient Encounter Medications as of 11/03/2023  Medication Sig  . acetaminophen  (TYLENOL ) 325 MG tablet Take 650 mg by mouth every 4 (four) hours as needed.  . Alpha-Lipoic Acid 200 MG CAPS Take 200 mg by mouth daily.  SABRA Bioflavonoid Products (BIOFLEX PO) Take 1 tablet by mouth daily.   . Continuous Blood Gluc Receiver (FREESTYLE LIBRE 2 READER) DEVI 1 application. by Does not apply route every 14 (fourteen) days.  . Continuous Glucose Sensor (FREESTYLE LIBRE 2 SENSOR) MISC APPLY 1 EVERY 14 (FOURTEEN) DAYS.  . dapagliflozin  propanediol (FARXIGA ) 10 MG TABS tablet Take 1 tablet (10 mg total) by mouth daily before breakfast.  . fish oil-omega-3 fatty acids  1000 MG capsule Take 1  capsule (1 g total) by mouth daily.  . Insulin  Degludec (TRESIBA ) 100 UNIT/ML SOLN Inject 10 Units into the skin daily.  . Multiple Vitamin (MULTIVITAMIN) tablet Take 1 tablet by mouth daily.  . olmesartan  (BENICAR ) 40 MG tablet TAKE 1 TABLET BY MOUTH EVERY DAY  . pantoprazole  (PROTONIX ) 40 MG tablet TAKE 1 TABLET BY MOUTH EVERY DAY   Facility-Administered Encounter Medications as of 11/03/2023  Medication  . cyanocobalamin  ((VITAMIN B-12)) injection 1,000 mcg     Lab Results  Component Value Date   WBC 11.9 (H) 10/06/2023   HGB 14.1 10/06/2023   HCT 43.8 10/06/2023   PLT 245.0 10/06/2023   GLUCOSE 181 (H) 10/06/2023   CHOL 196 10/06/2023   TRIG 174.0 (H) 10/06/2023   HDL 35.30 (L) 10/06/2023   LDLDIRECT 130.0 07/15/2021   LDLCALC 126 (H) 10/06/2023   ALT 17 10/06/2023   AST 14 10/06/2023   NA 138 10/06/2023   K 4.7 10/06/2023   CL 102 10/06/2023   CREATININE 1.04 10/06/2023   BUN 30 (H) 10/06/2023   CO2 27 10/06/2023   TSH 2.54 01/11/2023   HGBA1C 8.4 (H) 10/06/2023    MM 3D SCREENING MAMMOGRAM BILATERAL BREAST Result Date: 08/21/2023 CLINICAL DATA:  Screening. EXAM: DIGITAL SCREENING BILATERAL MAMMOGRAM WITH TOMOSYNTHESIS AND CAD TECHNIQUE: Bilateral screening digital craniocaudal and mediolateral oblique mammograms were obtained. Bilateral screening digital breast tomosynthesis was performed. The images were evaluated with computer-aided detection. COMPARISON:  Previous exam(s). ACR Breast Density Category c: The breasts are heterogeneously dense, which may obscure small masses. FINDINGS: There are no findings suspicious for malignancy. IMPRESSION: No mammographic evidence of malignancy. A result letter of this screening mammogram will be mailed directly to the patient. RECOMMENDATION: Screening mammogram in one year. (Code:SM-B-01Y) BI-RADS CATEGORY  1: Negative. Electronically Signed   By: Dina  Arceo M.D.   On: 08/21/2023 12:30   DG Bone Density Result Date:  08/17/2023 EXAM: DUAL X-RAY ABSORPTIOMETRY (DXA) FOR BONE MINERAL DENSITY 08/17/2023 1:24 pm CLINICAL DATA:  83 year old Female Postmenopausal. Estrogen deficiency TECHNIQUE: An axial (e.g., hips, spine) and/or appendicular (e.g., radius) exam was performed, as appropriate, using GE Secretary/administrator at [facility]. Images are obtained for bone mineral density measurement and are not obtained for diagnostic purposes. [id number] Exclusions: Lumbar spine.  Right hip due to surgery COMPARISON:  09/09/2014 FINDINGS: Scan quality: Good. LEFT FEMORAL NECK: BMD (in g/cm2): 1.095 T-score: 0.4 Z-score: 2.8 LEFT TOTAL HIP: BMD (in g/cm2): 1.111 T-score: 0.8 Z-score: 3.1 Rate of change from previous exam: No significant rate of change from previous exam. LEFT FOREARM (RADIUS 33%):  BMD (in g/cm2): 0.795 T-score: -0.9 Z-score: 2.2 Rate of change from previous exam: -6.5 % FRAX 10-YEAR PROBABILITY OF FRACTURE: FRAX not reported as the lowest BMD is not in the osteopenia range. IMPRESSION: Normal based on BMD. Fracture risk is not increased. RECOMMENDATIONS: 1. All patients should optimize calcium and vitamin D intake. 2. Consider FDA-approved medical therapies in postmenopausal women and men aged 7 years and older, based on the following: - A hip or vertebral (clinical or morphometric) fracture - T-score less than or equal to -2.5 and secondary causes have been excluded. - Low bone mass (T-score between -1.0 and -2.5) and a 10-year probability of a hip fracture greater than or equal to 3% or a 10-year probability of a major osteoporosis-related fracture greater than or equal to 20% based on the [place] -adapted WHO algorithm. - Clinician judgment and/or patient preferences may indicate treatment for people with 10-year fracture probabilities above or below these levels 3. Patients with diagnosis of osteoporosis or at high risk for fracture should have regular bone mineral density tests. For patients eligible  for Medicare, routine testing is allowed once every 2 years. The testing frequency can be increased to one year for patients who have rapidly progressing disease, those who are receiving or discontinuing medical therapy to restore bone mass, or have additional risk factors. Electronically Signed   By: Harrietta Sherry M.D.   On: 08/17/2023 16:10       Assessment & Plan:  Health care maintenance Assessment & Plan: Physical today 11/03/23.  Colonoscopy 09/2006.  Barium ok.  Discussed f/u colonoscopy/cologuard again today.  She wants to hold at this time.  Follow.  Mammogram - 08/17/23 - Birads I.    Type 2 diabetes mellitus with hyperglycemia, without long-term current use of insulin  (HCC)  Anemia, unspecified type  Hypercholesteremia  Primary hypertension     Allena Hamilton, MD

## 2023-11-05 NOTE — Assessment & Plan Note (Signed)
 Sugars reviewed as outlined. CGM - data reviewed as outlined. Increase tresiba  to 14 units daily follow sugars. Titreate as needed. If persistent elevation, consider low dose short acting around lunch. Adjust diet. Exercise as tolerated.  Follow met b and A1c.

## 2023-11-05 NOTE — Assessment & Plan Note (Signed)
 Has seen endocrinology previously.  Follow tsh.  Last ultrasound stable.  Follow tsh

## 2023-11-05 NOTE — Assessment & Plan Note (Signed)
 Low cholesterol diet and exercise. Has continued to decline cholesterol medication. Follow.

## 2023-11-05 NOTE — Assessment & Plan Note (Signed)
 Benicar  40mg  q day and on farxiga .  On farxiga  10mg  q day. Follow pressures.  Follow metabolic panel. No change in medication today.

## 2023-11-05 NOTE — Assessment & Plan Note (Signed)
 Follow cbc.

## 2023-11-05 NOTE — Assessment & Plan Note (Signed)
 Avoid antiinflammatories.  Continue benicar  40mg  q day.  On farxiga  - 10mg  q day.  Follow pressures.  Follow metabolic panel. Stay hydrated.

## 2023-12-12 ENCOUNTER — Telehealth: Payer: Self-pay

## 2023-12-12 NOTE — Telephone Encounter (Signed)
 Called Patient to let her know that we received 1 box of Patient assistance Tresiba  medication and it is ready for pick up.

## 2023-12-25 ENCOUNTER — Telehealth: Payer: Self-pay

## 2023-12-25 NOTE — Telephone Encounter (Signed)
 PAP: Patient assistance application for Farxiga through [employer] ([employer]) has been mailed to pt's home address on file. Provider portion of application will be faxed to provider's office.

## 2023-12-26 NOTE — Telephone Encounter (Signed)
 Form faxed back per request

## 2023-12-26 NOTE — Telephone Encounter (Signed)
 Received provider portion PAP application Farxiga  ([employer]ME).

## 2023-12-26 NOTE — Telephone Encounter (Signed)
 Form placed on folder for review and signature

## 2023-12-26 NOTE — Telephone Encounter (Signed)
 Signed and placed in box.

## 2024-01-09 NOTE — Telephone Encounter (Signed)
 PAP: Application for Marcelline Deist has been submitted to [employer] (AZ[employer]Me), via fax

## 2024-01-12 NOTE — Telephone Encounter (Signed)
 PAP: Patient assistance application for Farxiga  has been approved by PAP Companies: [employer] from 02/22/2024 to 02/20/2025. Medication should be delivered to PAP Delivery: Home. For further shipping updates, please contact [employer] ([employer]) at [phone]. Patient ID is: [id number]

## 2024-02-02 ENCOUNTER — Other Ambulatory Visit

## 2024-02-08 ENCOUNTER — Other Ambulatory Visit

## 2024-02-08 ENCOUNTER — Ambulatory Visit: Admitting: Internal Medicine

## 2024-02-11 ENCOUNTER — Other Ambulatory Visit: Payer: Self-pay | Admitting: Internal Medicine

## 2024-02-11 DIAGNOSIS — E1165 Type 2 diabetes mellitus with hyperglycemia: Secondary | ICD-10-CM

## 2024-02-12 ENCOUNTER — Other Ambulatory Visit: Payer: Self-pay | Admitting: Internal Medicine

## 2024-02-12 DIAGNOSIS — E1165 Type 2 diabetes mellitus with hyperglycemia: Secondary | ICD-10-CM

## 2024-02-12 NOTE — Telephone Encounter (Signed)
 Ok to send in the [place] 3 plus sesor if pt agreeable.

## 2024-02-13 MED ORDER — FREESTYLE LIBRE 3 PLUS SENSOR MISC: 3 refills | Status: AC

## 2024-02-13 NOTE — Telephone Encounter (Signed)
 Rx ok'd for libre 3 plus sensor.

## 2024-02-13 NOTE — Telephone Encounter (Signed)
 I have pended the libre 3 plus sensor for your approval.

## 2024-02-26 ENCOUNTER — Telehealth: Payer: Self-pay

## 2024-02-26 NOTE — Telephone Encounter (Signed)
 Copied from CRM [id number]. Topic: Clinical - Medication Question >> Feb 26, 2024  3:27 PM Mercedes MATSU wrote: Reason for CRM: Patient is requesting a call back in regards to her diabetes sensors. She is requesting a call from dr rue nurse.   CB: [phone].

## 2024-02-29 NOTE — Telephone Encounter (Signed)
 Spoke to pt. Pt was unclear on what she received via mail for her sensors. Advised pt she can go to her local pharmacy and they would help her. Pt verbalized understanding and stated she would do that

## 2024-03-03 ENCOUNTER — Other Ambulatory Visit: Payer: Self-pay | Admitting: Internal Medicine

## 2024-03-05 ENCOUNTER — Ambulatory Visit: Payer: Self-pay

## 2024-03-05 NOTE — Telephone Encounter (Signed)
 FYI Only or Action Required?: FYI only for provider: appointment scheduled on 03/06/24.  Patient was last seen in primary care on 11/03/2023 by Glendia Shad, MD.  Called Nurse Triage reporting Mass.  Symptoms began several days ago.  Interventions attempted: Nothing.  Symptoms are: unchanged.  Triage Disposition: See Physician Within 24 Hours  Patient/caregiver understands and will follow disposition?: Yes   Reason for Disposition  [1] Swelling is painful to touch AND [2] no fever  Answer Assessment - Initial Assessment Questions Patient states she first noted this knot about 2 days ago and it is tender to touch with mild swelling. Office visit advised.   1. APPEARANCE of SWELLING: What does it look like?     A small, soft knot with mild swelling  2. SIZE: How large is the swelling? (e.g., inches, cm; or compare to size of pinhead, tip of pen, eraser, coin, pea, grape, ping pong ball)      A little smaller than a grape  3. LOCATION: Where is the swelling located?     On the neck right under jaw line  4. ONSET: When did the swelling start?     2 days ago  5. COLOR: What color is it? Is there more than one color?     Color of skin  6. PAIN: Is there any pain? If Yes, ask: How bad is the pain? (Scale 1-10; or mild, moderate, severe)       Tender to touch, 7/10  7. ITCH: Does it itch? If Yes, ask: How bad is the itch?      Unknown  8. CAUSE: What do you think caused the swelling?     Not sure  9 OTHER SYMPTOMS: Do you have any other symptoms? (e.g., fever)     Denies any other symptoms  Protocols used: Skin Lump or Localized Swelling-A-AH  Reason for Triage: knot in neck little pain if she touch the knot. [phone]

## 2024-03-05 NOTE — Telephone Encounter (Signed)
Agree with need for evaluation.  

## 2024-03-06 ENCOUNTER — Ambulatory Visit

## 2024-03-06 VITALS — BP 181/71 | HR 103 | Temp 98.2°F

## 2024-03-06 DIAGNOSIS — R221 Localized swelling, mass and lump, neck: Secondary | ICD-10-CM | POA: Diagnosis not present

## 2024-03-06 DIAGNOSIS — I10 Essential (primary) hypertension: Secondary | ICD-10-CM | POA: Diagnosis not present

## 2024-03-06 NOTE — Progress Notes (Signed)
 "     Acute visit   Patient: Cathy Vazquez   DOB: [birth year]   84 y.o. Female  MRN: [id number] PCP: Glendia Shad, MD   Chief Complaint  Patient presents with   Mass    Right jaw some swelling noticed. Hurt to touch Noticed it o Monday Mar 04 2024   Subjective    Discussed the use of AI scribe software for clinical note transcription with the patient, who gave verbal consent to proceed.  History of Present Illness Cathy Vazquez is an 84 year old female who presents with a newly noticed lump on her neck.  She noticed a lump on her neck yesterday, which was initially painful upon palpation, but the pain has since decreased. She recalls having a severe cough several weeks ago that lasted about a week. The lump appears slightly smaller today compared to when she first noticed it.  She has a history of hypertension and is currently taking olmesartan . She confirms taking her blood pressure medication this morning and recently had it refilled. She does not regularly check her blood pressure at home due to needing a new cuff.  Her family history includes a brother who had prostate cancer and passed away last year, possibly due to COVID-19 complications. Her parents had heart-related issues.  No current pain in her teeth or mouth. She denies recent illness except for the cough several weeks ago and has not started any new medications recently. Denies fever, night sweats, chills.   Review of systems as noted in HPI.   Objective    BP (!) 181/71 Comment: 2nd reading provider notified  Pulse (!) 103   Temp 98.2 F (36.8 C) (Oral)   LMP 02/21/1984  Physical Exam Constitutional:      Appearance: Normal appearance.  HENT:     Head: Normocephalic and atraumatic.     Mouth/Throat:     Mouth: Mucous membranes are moist.  Eyes:     Pupils: Pupils are equal, round, and reactive to light.  Neck:     Comments: ~2cm firm mass inferior to R lateral jaw line. No tenderness to  palpation, no surrounding erythema. Pulmonary:     Effort: Pulmonary effort is normal.  Skin:    General: Skin is warm.  Neurological:     General: No focal deficit present.     Mental Status: She is alert.       No results found for any visits on 03/06/24.  Assessment & Plan     Problem List Items Addressed This Visit       Cardiovascular and Mediastinum   Hypertension     Other   Neck mass - Primary   Relevant Orders   CBC with Differential/Platelet   US  Soft Tissue Head/Neck (NON-THYROID )   Assessment & Plan Neck mass Newly noticed neck mass, differential includes reactive lymphadenopathy versus malignancy. No infection or abscess signs. No personal cancer history. - Ordered STAT ultrasound of neck mass. - Ordered blood counts.  Hypertension Blood pressure significantly elevated today. Asymptomatic. Previous readings well-controlled. Questionable medication adherence. Patient does not check her BP at home. Potential need for additional antihypertensive medication if home readings remain elevated.  - Continue olmesartan ,  will ensure she has this at home - Check blood pressure at home and report to primary care provider. - Consider adding antihypertensive medication if home blood pressure remains elevated. - Return precautions given   No orders of the defined types were placed in this encounter.  No follow-ups on file.      Isaiah DELENA Pepper, MD  [facility] [phone] (phone) [phone] (fax) "

## 2024-03-06 NOTE — Telephone Encounter (Signed)
 Lvm for pt to return call. Called to see how pt was currently doing and to ensure she is seen today at 4pm

## 2024-03-07 ENCOUNTER — Ambulatory Visit

## 2024-03-07 ENCOUNTER — Ambulatory Visit: Payer: Self-pay

## 2024-03-07 LAB — CBC WITH DIFFERENTIAL/PLATELET
Basophils Absolute: 0.1 x10E3/uL (ref 0.0–0.2)
Basos: 1 %
EOS (ABSOLUTE): 0.2 x10E3/uL (ref 0.0–0.4)
Eos: 2 %
Hematocrit: 44.3 % (ref 34.0–46.6)
Hemoglobin: 14.2 g/dL (ref 11.1–15.9)
Immature Grans (Abs): 0.1 x10E3/uL (ref 0.0–0.1)
Immature Granulocytes: 1 %
Lymphocytes Absolute: 2.3 x10E3/uL (ref 0.7–3.1)
Lymphs: 22 %
MCH: 28 pg (ref 26.6–33.0)
MCHC: 32.1 g/dL (ref 31.5–35.7)
MCV: 87 fL (ref 79–97)
Monocytes Absolute: 0.7 x10E3/uL (ref 0.1–0.9)
Monocytes: 7 %
Neutrophils Absolute: 7.1 x10E3/uL — ABNORMAL HIGH (ref 1.4–7.0)
Neutrophils: 67 %
Platelets: 224 x10E3/uL (ref 150–450)
RBC: 5.08 x10E6/uL (ref 3.77–5.28)
RDW: 14.6 % (ref 11.7–15.4)
WBC: 10.5 x10E3/uL (ref 3.4–10.8)

## 2024-03-11 ENCOUNTER — Ambulatory Visit

## 2024-03-11 ENCOUNTER — Ambulatory Visit
Admission: RE | Admit: 2024-03-11 | Discharge: 2024-03-11 | Disposition: A | Discharge status: Home / Self Care | Source: Ambulatory Visit

## 2024-03-11 DIAGNOSIS — R221 Localized swelling, mass and lump, neck: Secondary | ICD-10-CM | POA: Insufficient documentation

## 2024-03-27 ENCOUNTER — Other Ambulatory Visit (HOSPITAL_COMMUNITY): Payer: Self-pay

## 2024-03-27 ENCOUNTER — Other Ambulatory Visit: Payer: Self-pay

## 2024-03-27 ENCOUNTER — Encounter: Payer: Self-pay | Admitting: Internal Medicine

## 2024-03-27 ENCOUNTER — Ambulatory Visit: Admitting: Internal Medicine

## 2024-03-27 ENCOUNTER — Telehealth: Payer: Self-pay

## 2024-03-27 DIAGNOSIS — I10 Essential (primary) hypertension: Secondary | ICD-10-CM

## 2024-03-27 DIAGNOSIS — E7800 Pure hypercholesterolemia, unspecified: Secondary | ICD-10-CM

## 2024-03-27 DIAGNOSIS — E1165 Type 2 diabetes mellitus with hyperglycemia: Secondary | ICD-10-CM

## 2024-03-27 MED ORDER — PANTOPRAZOLE SODIUM 40 MG PO TBEC
40.0000 mg | DELAYED_RELEASE_TABLET | Freq: Every day | ORAL | 1 refills | Status: AC
  Filled 2024-03-27: qty 90, 90d supply, fill #0

## 2024-03-27 MED ORDER — FREESTYLE LIBRE 3 READER DEVI: 1.0000 | Freq: Every day | 0 refills | Status: AC

## 2024-03-27 MED ORDER — OLMESARTAN MEDOXOMIL 40 MG PO TABS
40.0000 mg | ORAL_TABLET | Freq: Every day | ORAL | 1 refills | Status: AC
  Filled 2024-03-27: qty 90, 90d supply, fill #0

## 2024-03-27 NOTE — Progress Notes (Unsigned)
 "  Subjective:    Patient ID: Cathy Vazquez, female    DOB: 1940-06-19, 84 y.o.   MRN: [id number]  Patient here for No chief complaint on file.   HPI Here for work in appt. Has a hitory of diabetes, hypertension and hypercholesterolemia. Continues on farxiga  and tresiba . GGM - time active  Was evaluated 03/06/24 - work in - lump on her neck. Had ultrasound (neck) - Circumscribed cystic lesion in the upper right neck at the region of concern measuring approximately 6 x 7 x 8 mm, described as decreasing in size over the last several days. Right level 2 lymph node in close proximity measuring approximately 5 x 4 x 4 mm. Right cervical lymph node measuring approximately 10 x 8 mm. Similar benign-appearing left level 2 lymph node measuring approximately 6 x 5 x 5 mm.   Past Medical History:  Diagnosis Date   Anemia    Diverticulosis    GERD (gastroesophageal reflux disease)    Glaucoma    Hypercholesterolemia    Hypertension    Thyroid  nodule    Past Surgical History:  Procedure Laterality Date   ABDOMINAL HYSTERECTOMY  1987   fibroids, ovaries not removed   TONSILLECTOMY     Family History  Problem Relation Age of Onset   Hypertension Mother    Hypercholesterolemia Mother    Rheumatic fever Mother    Diabetes Father    Hypertension Father    Hypercholesterolemia Father    Heart disease Father        CABG   Diabetes Sister    Hypertension Brother    Breast cancer Maternal Aunt    Breast cancer Cousin    Colon cancer Neg Hx    Social History   Socioeconomic History   Marital status: Divorced    Spouse name: Not on file   Number of children: 2   Years of education: Not on file   Highest education level: Not on file  Occupational History   Not on file  Tobacco Use   Smoking status: Never   Smokeless tobacco: Never  Vaping Use   Vaping status: Never Used  Substance and Sexual Activity   Alcohol use: No    Alcohol/week: 0.0 standard drinks of alcohol   Drug  use: No   Sexual activity: Not Currently  Other Topics Concern   Not on file  Social History Narrative   Not on file   Social Drivers of Health   Tobacco Use: Low Risk (03/06/2024)   Patient History    Smoking Tobacco Use: Never    Smokeless Tobacco Use: Never    Passive Exposure: Not on file  Financial Resource Strain: Low Risk (05/24/2023)   Overall Financial Resource Strain (CARDIA)    Difficulty of Paying Living Expenses: Not hard at all  Food Insecurity: No Food Insecurity (05/24/2023)   Hunger Vital Sign    Worried About Running Out of Food in the Last Year: Never true    Ran Out of Food in the Last Year: Never true  Transportation Needs: No Transportation Needs (05/24/2023)   PRAPARE - Administrator, Civil Service (Medical): No    Lack of Transportation (Non-Medical): No  Physical Activity: Inactive (05/24/2023)   Exercise Vital Sign    Days of Exercise per Week: 0 days    Minutes of Exercise per Session: 0 min  Stress: No Stress Concern Present (05/24/2023)   [employer] of Occupational Health - Occupational Stress Questionnaire  Feeling of Stress : Not at all  Social Connections: Socially Isolated (05/24/2023)   Social Connection and Isolation Panel    Frequency of Communication with Friends and Family: More than three times a week    Frequency of Social Gatherings with Friends and Family: More than three times a week    Attends Religious Services: Never    Database Administrator or Organizations: No    Attends Banker Meetings: Never    Marital Status: Divorced  Depression (PHQ2-9): Low Risk (03/06/2024)   Depression (PHQ2-9)    PHQ-2 Score: 0  Alcohol Screen: Low Risk (05/24/2023)   Alcohol Screen    Last Alcohol Screening Score (AUDIT): 0  Housing: Unknown (05/24/2023)   Housing Stability Vital Sign    Unable to Pay for Housing in the Last Year: No    Number of Times Moved in the Last Year: Not on file    Homeless in the Last Year: No   Utilities: Not At Risk (05/24/2023)   [facility] Utilities    Threatened with loss of utilities: No  Health Literacy: Adequate Health Literacy (05/24/2023)   B1300 Health Literacy    Frequency of need for help with medical instructions: Never     Review of Systems     Objective:     LMP 02/21/1984  Wt Readings from Last 3 Encounters:  11/03/23 178 lb 9.6 oz (81 kg)  06/01/23 177 lb (80.3 kg)  05/24/23 170 lb (77.1 kg)    Physical Exam  {Perform Simple Foot Exam  Perform Detailed exam:1} {Insert foot Exam (Optional):30965}   Outpatient Encounter Medications as of 03/27/2024  Medication Sig   acetaminophen  (TYLENOL ) 325 MG tablet Take 650 mg by mouth every 4 (four) hours as needed.   Alpha-Lipoic Acid 200 MG CAPS Take 200 mg by mouth daily.   Bioflavonoid Products (BIOFLEX PO) Take 1 tablet by mouth daily.    Continuous Blood Gluc Receiver (FREESTYLE LIBRE 2 READER) DEVI 1 application. by Does not apply route every 14 (fourteen) days.   Continuous Glucose Sensor (FREESTYLE LIBRE 2 SENSOR) MISC APPLY 1 EVERY 14 (FOURTEEN) DAYS.   Continuous Glucose Sensor (FREESTYLE LIBRE 3 PLUS SENSOR) MISC Change sensor every 15 days.   dapagliflozin  propanediol (FARXIGA ) 10 MG TABS tablet Take 1 tablet (10 mg total) by mouth daily before breakfast. (Patient not taking: Reported on 03/06/2024)   fish oil-omega-3 fatty acids  1000 MG capsule Take 1 capsule (1 g total) by mouth daily. (Patient not taking: Reported on 03/06/2024)   Insulin  Degludec (TRESIBA ) 100 UNIT/ML SOLN Inject 10 Units into the skin daily.   Multiple Vitamin (MULTIVITAMIN) tablet Take 1 tablet by mouth daily.   olmesartan  (BENICAR ) 40 MG tablet TAKE 1 TABLET BY MOUTH EVERY DAY   pantoprazole  (PROTONIX ) 40 MG tablet TAKE 1 TABLET BY MOUTH EVERY DAY   Facility-Administered Encounter Medications as of 03/27/2024  Medication   cyanocobalamin  ((VITAMIN B-12)) injection 1,000 mcg     Lab Results  Component Value Date   WBC 10.5  03/06/2024   HGB 14.2 03/06/2024   HCT 44.3 03/06/2024   PLT 224 03/06/2024   GLUCOSE 181 (H) 10/06/2023   CHOL 196 10/06/2023   TRIG 174.0 (H) 10/06/2023   HDL 35.30 (L) 10/06/2023   LDLDIRECT 130.0 07/15/2021   LDLCALC 126 (H) 10/06/2023   ALT 17 10/06/2023   AST 14 10/06/2023   NA 138 10/06/2023   K 4.7 10/06/2023   CL 102 10/06/2023   CREATININE 1.04 10/06/2023  BUN 30 (H) 10/06/2023   CO2 27 10/06/2023   TSH 2.54 01/11/2023   HGBA1C 8.4 (H) 10/06/2023    US  Soft Tissue Head/Neck (NON-THYROID ) Result Date: 03/11/2024 EXAM: US  Neck Nonvascular Soft Tissue Ultrasound 03/11/2024 09:32:10 AM TECHNIQUE: Real-time ultrasound scan of the neck with image documentation. COMPARISON: None available. CLINICAL HISTORY: 2 cm right neck mass, concern for enlarged lymph node. FINDINGS: SOFT TISSUES: There is a circumscribed cystic lesion in the region of concern in the upper right neck measuring approximately 6 x 7 x 8 mm, which the patient describes as decreasing in size over the last several days. There is a right level 2 node in close proximity measuring approximately 5 x 4 x 4 mm. There is also a right cervical node measuring approximately 10 x 8 mm. There is a similar benign-appearing left level 2 node measuring approximately 6 x 5 x 5 mm. IMPRESSION: 1. Circumscribed cystic lesion in the upper right neck at the region of concern measuring approximately 6 x 7 x 8 mm, described as decreasing in size over the last several days. 2. Right level 2 lymph node in close proximity measuring approximately 5 x 4 x 4 mm. 3. Right cervical lymph node measuring approximately 10 x 8 mm. 4. Similar benign-appearing left level 2 lymph node measuring approximately 6 x 5 x 5 mm. Electronically signed by: Evalene Coho MD 03/11/2024 11:02 AM EST RP Workstation: [id number]       Assessment & Plan:  Type 2 diabetes mellitus with hyperglycemia, without long-term current use of insulin  (HCC)  Primary  hypertension  Hypercholesteremia     Allena Hamilton, MD "

## 2024-03-27 NOTE — Addendum Note (Signed)
 Addended by: SEBASTIAN KNEE on: 03/27/2024 04:25 PM   Modules accepted: Orders

## 2024-03-27 NOTE — Progress Notes (Unsigned)
 Cathy Vazquez

## 2024-03-27 NOTE — Telephone Encounter (Signed)
 Prescription for reader sent to [facility] in [place] per pt request.

## 2024-03-27 NOTE — Telephone Encounter (Signed)
 Copied from CRM [id number]. Topic: Clinical - Prescription Issue >> Mar 27, 2024  4:02 PM Aisha D wrote: Reason for CRM: Pt stated that she received the prescription for the Continuous Glucose Sensor (FREESTYLE LIBRE 3 PLUS SENSOR) MISC but didn't receive the [facility] reader. Pt is requesting a prescription for the reader and would like a callback with an update.

## 2024-03-28 ENCOUNTER — Other Ambulatory Visit (HOSPITAL_COMMUNITY): Payer: Self-pay

## 2024-04-18 ENCOUNTER — Ambulatory Visit: Admitting: Internal Medicine

## 2024-04-18 ENCOUNTER — Other Ambulatory Visit

## 2024-04-25 ENCOUNTER — Ambulatory Visit: Admitting: Internal Medicine

## 2024-05-29 ENCOUNTER — Ambulatory Visit
